# Patient Record
Sex: Female | Born: 1935 | Race: Black or African American | Hispanic: No | Smoking: Never smoker
Health system: Southern US, Community
[De-identification: ages and names within clinical notes are randomized; demographics above are authoritative.]

## PROBLEM LIST (undated history)

## (undated) DIAGNOSIS — I10 Essential (primary) hypertension: Secondary | ICD-10-CM

## (undated) DIAGNOSIS — R443 Hallucinations, unspecified: Secondary | ICD-10-CM

## (undated) DIAGNOSIS — N3281 Overactive bladder: Secondary | ICD-10-CM

## (undated) DIAGNOSIS — H409 Unspecified glaucoma: Secondary | ICD-10-CM

## (undated) DIAGNOSIS — C50919 Malignant neoplasm of unspecified site of unspecified female breast: Secondary | ICD-10-CM

## (undated) DIAGNOSIS — K5792 Diverticulitis of intestine, part unspecified, without perforation or abscess without bleeding: Secondary | ICD-10-CM

## (undated) DIAGNOSIS — F0391 Unspecified dementia with behavioral disturbance: Secondary | ICD-10-CM

## (undated) DIAGNOSIS — F03918 Unspecified dementia, unspecified severity, with other behavioral disturbance: Secondary | ICD-10-CM

## (undated) HISTORY — PX: ABDOMINAL HYSTERECTOMY: SHX81

## (undated) HISTORY — PX: BREAST BIOPSY: SHX20

## (undated) HISTORY — DX: Unspecified dementia, unspecified severity, with other behavioral disturbance: F03.918

## (undated) HISTORY — DX: Essential (primary) hypertension: I10

## (undated) HISTORY — PX: BREAST LUMPECTOMY: SHX2

## (undated) HISTORY — DX: Unspecified glaucoma: H40.9

## (undated) HISTORY — DX: Unspecified dementia with behavioral disturbance: F03.91

## (undated) HISTORY — DX: Overactive bladder: N32.81

## (undated) HISTORY — DX: Hallucinations, unspecified: R44.3

## (undated) HISTORY — PX: CATARACT EXTRACTION, BILATERAL: SHX1313

## (undated) HISTORY — DX: Diverticulitis of intestine, part unspecified, without perforation or abscess without bleeding: K57.92

---

## 2016-10-29 DIAGNOSIS — L851 Acquired keratosis [keratoderma] palmaris et plantaris: Secondary | ICD-10-CM | POA: Diagnosis not present

## 2016-10-29 DIAGNOSIS — E1151 Type 2 diabetes mellitus with diabetic peripheral angiopathy without gangrene: Secondary | ICD-10-CM | POA: Diagnosis not present

## 2016-10-29 DIAGNOSIS — B351 Tinea unguium: Secondary | ICD-10-CM | POA: Diagnosis not present

## 2016-11-25 DIAGNOSIS — I10 Essential (primary) hypertension: Secondary | ICD-10-CM | POA: Diagnosis not present

## 2016-11-25 DIAGNOSIS — G4733 Obstructive sleep apnea (adult) (pediatric): Secondary | ICD-10-CM | POA: Diagnosis not present

## 2016-11-25 DIAGNOSIS — F0390 Unspecified dementia without behavioral disturbance: Secondary | ICD-10-CM | POA: Diagnosis not present

## 2016-11-25 DIAGNOSIS — N183 Chronic kidney disease, stage 3 (moderate): Secondary | ICD-10-CM | POA: Diagnosis not present

## 2016-12-12 DIAGNOSIS — F17290 Nicotine dependence, other tobacco product, uncomplicated: Secondary | ICD-10-CM | POA: Diagnosis not present

## 2016-12-12 DIAGNOSIS — K5900 Constipation, unspecified: Secondary | ICD-10-CM | POA: Diagnosis not present

## 2016-12-12 DIAGNOSIS — N289 Disorder of kidney and ureter, unspecified: Secondary | ICD-10-CM | POA: Diagnosis not present

## 2016-12-12 DIAGNOSIS — F0390 Unspecified dementia without behavioral disturbance: Secondary | ICD-10-CM | POA: Diagnosis not present

## 2016-12-12 DIAGNOSIS — Z853 Personal history of malignant neoplasm of breast: Secondary | ICD-10-CM | POA: Diagnosis not present

## 2016-12-12 DIAGNOSIS — Z90710 Acquired absence of both cervix and uterus: Secondary | ICD-10-CM | POA: Diagnosis not present

## 2016-12-12 DIAGNOSIS — E119 Type 2 diabetes mellitus without complications: Secondary | ICD-10-CM | POA: Diagnosis not present

## 2016-12-12 DIAGNOSIS — R109 Unspecified abdominal pain: Secondary | ICD-10-CM | POA: Diagnosis not present

## 2016-12-12 DIAGNOSIS — Z7982 Long term (current) use of aspirin: Secondary | ICD-10-CM | POA: Diagnosis not present

## 2016-12-12 DIAGNOSIS — I10 Essential (primary) hypertension: Secondary | ICD-10-CM | POA: Diagnosis not present

## 2016-12-12 DIAGNOSIS — Z880 Allergy status to penicillin: Secondary | ICD-10-CM | POA: Diagnosis not present

## 2016-12-14 DIAGNOSIS — M542 Cervicalgia: Secondary | ICD-10-CM | POA: Diagnosis not present

## 2016-12-14 DIAGNOSIS — K5909 Other constipation: Secondary | ICD-10-CM | POA: Diagnosis not present

## 2016-12-24 DIAGNOSIS — E7800 Pure hypercholesterolemia, unspecified: Secondary | ICD-10-CM | POA: Diagnosis not present

## 2016-12-24 DIAGNOSIS — I10 Essential (primary) hypertension: Secondary | ICD-10-CM | POA: Diagnosis not present

## 2016-12-24 DIAGNOSIS — H401132 Primary open-angle glaucoma, bilateral, moderate stage: Secondary | ICD-10-CM | POA: Diagnosis not present

## 2016-12-24 DIAGNOSIS — E119 Type 2 diabetes mellitus without complications: Secondary | ICD-10-CM | POA: Diagnosis not present

## 2017-01-07 DIAGNOSIS — L851 Acquired keratosis [keratoderma] palmaris et plantaris: Secondary | ICD-10-CM | POA: Diagnosis not present

## 2017-01-07 DIAGNOSIS — E1151 Type 2 diabetes mellitus with diabetic peripheral angiopathy without gangrene: Secondary | ICD-10-CM | POA: Diagnosis not present

## 2017-01-07 DIAGNOSIS — B351 Tinea unguium: Secondary | ICD-10-CM | POA: Diagnosis not present

## 2017-02-10 DIAGNOSIS — E119 Type 2 diabetes mellitus without complications: Secondary | ICD-10-CM | POA: Diagnosis not present

## 2017-03-03 DIAGNOSIS — N183 Chronic kidney disease, stage 3 (moderate): Secondary | ICD-10-CM | POA: Diagnosis not present

## 2017-03-03 DIAGNOSIS — I10 Essential (primary) hypertension: Secondary | ICD-10-CM | POA: Diagnosis not present

## 2017-03-03 DIAGNOSIS — M47813 Spondylosis without myelopathy or radiculopathy, cervicothoracic region: Secondary | ICD-10-CM | POA: Diagnosis not present

## 2017-03-03 DIAGNOSIS — E119 Type 2 diabetes mellitus without complications: Secondary | ICD-10-CM | POA: Diagnosis not present

## 2017-04-25 DIAGNOSIS — D62 Acute posthemorrhagic anemia: Secondary | ICD-10-CM | POA: Diagnosis present

## 2017-04-25 DIAGNOSIS — Z90710 Acquired absence of both cervix and uterus: Secondary | ICD-10-CM | POA: Diagnosis not present

## 2017-04-25 DIAGNOSIS — K649 Unspecified hemorrhoids: Secondary | ICD-10-CM | POA: Diagnosis not present

## 2017-04-25 DIAGNOSIS — G4733 Obstructive sleep apnea (adult) (pediatric): Secondary | ICD-10-CM | POA: Diagnosis present

## 2017-04-25 DIAGNOSIS — H409 Unspecified glaucoma: Secondary | ICD-10-CM | POA: Diagnosis present

## 2017-04-25 DIAGNOSIS — Z880 Allergy status to penicillin: Secondary | ICD-10-CM | POA: Diagnosis not present

## 2017-04-25 DIAGNOSIS — K922 Gastrointestinal hemorrhage, unspecified: Secondary | ICD-10-CM | POA: Diagnosis not present

## 2017-04-25 DIAGNOSIS — K449 Diaphragmatic hernia without obstruction or gangrene: Secondary | ICD-10-CM | POA: Diagnosis present

## 2017-04-25 DIAGNOSIS — K5790 Diverticulosis of intestine, part unspecified, without perforation or abscess without bleeding: Secondary | ICD-10-CM | POA: Diagnosis not present

## 2017-04-25 DIAGNOSIS — M1990 Unspecified osteoarthritis, unspecified site: Secondary | ICD-10-CM | POA: Diagnosis present

## 2017-04-25 DIAGNOSIS — Z853 Personal history of malignant neoplasm of breast: Secondary | ICD-10-CM | POA: Diagnosis not present

## 2017-04-25 DIAGNOSIS — B9681 Helicobacter pylori [H. pylori] as the cause of diseases classified elsewhere: Secondary | ICD-10-CM | POA: Diagnosis not present

## 2017-04-25 DIAGNOSIS — K2971 Gastritis, unspecified, with bleeding: Secondary | ICD-10-CM | POA: Diagnosis not present

## 2017-04-25 DIAGNOSIS — K2951 Unspecified chronic gastritis with bleeding: Secondary | ICD-10-CM | POA: Diagnosis not present

## 2017-04-25 DIAGNOSIS — K640 First degree hemorrhoids: Secondary | ICD-10-CM | POA: Diagnosis present

## 2017-04-25 DIAGNOSIS — D649 Anemia, unspecified: Secondary | ICD-10-CM | POA: Diagnosis not present

## 2017-04-25 DIAGNOSIS — F17290 Nicotine dependence, other tobacco product, uncomplicated: Secondary | ICD-10-CM | POA: Diagnosis present

## 2017-04-25 DIAGNOSIS — Z7982 Long term (current) use of aspirin: Secondary | ICD-10-CM | POA: Diagnosis not present

## 2017-04-25 DIAGNOSIS — I10 Essential (primary) hypertension: Secondary | ICD-10-CM | POA: Diagnosis present

## 2017-04-25 DIAGNOSIS — F0390 Unspecified dementia without behavioral disturbance: Secondary | ICD-10-CM | POA: Diagnosis present

## 2017-04-25 DIAGNOSIS — K5731 Diverticulosis of large intestine without perforation or abscess with bleeding: Secondary | ICD-10-CM | POA: Diagnosis present

## 2017-04-25 DIAGNOSIS — E119 Type 2 diabetes mellitus without complications: Secondary | ICD-10-CM | POA: Diagnosis present

## 2017-04-25 DIAGNOSIS — E785 Hyperlipidemia, unspecified: Secondary | ICD-10-CM | POA: Diagnosis present

## 2017-04-25 DIAGNOSIS — K2970 Gastritis, unspecified, without bleeding: Secondary | ICD-10-CM | POA: Diagnosis present

## 2017-04-26 DIAGNOSIS — D649 Anemia, unspecified: Secondary | ICD-10-CM | POA: Diagnosis not present

## 2017-04-26 DIAGNOSIS — K922 Gastrointestinal hemorrhage, unspecified: Secondary | ICD-10-CM | POA: Diagnosis not present

## 2017-04-27 DIAGNOSIS — K2951 Unspecified chronic gastritis with bleeding: Secondary | ICD-10-CM | POA: Diagnosis not present

## 2017-04-27 DIAGNOSIS — K5790 Diverticulosis of intestine, part unspecified, without perforation or abscess without bleeding: Secondary | ICD-10-CM | POA: Diagnosis not present

## 2017-04-27 DIAGNOSIS — K2971 Gastritis, unspecified, with bleeding: Secondary | ICD-10-CM | POA: Diagnosis not present

## 2017-04-27 DIAGNOSIS — B9681 Helicobacter pylori [H. pylori] as the cause of diseases classified elsewhere: Secondary | ICD-10-CM | POA: Diagnosis not present

## 2017-04-27 DIAGNOSIS — K922 Gastrointestinal hemorrhage, unspecified: Secondary | ICD-10-CM | POA: Diagnosis not present

## 2017-04-27 DIAGNOSIS — K649 Unspecified hemorrhoids: Secondary | ICD-10-CM | POA: Diagnosis not present

## 2017-09-07 DIAGNOSIS — G3184 Mild cognitive impairment, so stated: Secondary | ICD-10-CM | POA: Diagnosis not present

## 2017-09-09 DIAGNOSIS — Z23 Encounter for immunization: Secondary | ICD-10-CM | POA: Diagnosis not present

## 2017-09-09 DIAGNOSIS — I10 Essential (primary) hypertension: Secondary | ICD-10-CM | POA: Diagnosis not present

## 2017-09-09 DIAGNOSIS — H401132 Primary open-angle glaucoma, bilateral, moderate stage: Secondary | ICD-10-CM | POA: Diagnosis not present

## 2017-09-09 DIAGNOSIS — E7800 Pure hypercholesterolemia, unspecified: Secondary | ICD-10-CM | POA: Diagnosis not present

## 2017-09-09 DIAGNOSIS — K5791 Diverticulosis of intestine, part unspecified, without perforation or abscess with bleeding: Secondary | ICD-10-CM | POA: Diagnosis not present

## 2017-09-09 DIAGNOSIS — F0390 Unspecified dementia without behavioral disturbance: Secondary | ICD-10-CM | POA: Diagnosis not present

## 2017-12-20 DIAGNOSIS — I10 Essential (primary) hypertension: Secondary | ICD-10-CM | POA: Diagnosis not present

## 2017-12-20 DIAGNOSIS — H35033 Hypertensive retinopathy, bilateral: Secondary | ICD-10-CM | POA: Diagnosis not present

## 2017-12-20 DIAGNOSIS — H401130 Primary open-angle glaucoma, bilateral, stage unspecified: Secondary | ICD-10-CM | POA: Diagnosis not present

## 2017-12-20 DIAGNOSIS — E7800 Pure hypercholesterolemia, unspecified: Secondary | ICD-10-CM | POA: Diagnosis not present

## 2018-01-13 ENCOUNTER — Ambulatory Visit: Payer: Self-pay | Admitting: Primary Care

## 2018-01-13 ENCOUNTER — Encounter (INDEPENDENT_AMBULATORY_CARE_PROVIDER_SITE_OTHER): Payer: Self-pay

## 2018-01-13 ENCOUNTER — Encounter: Payer: Self-pay | Admitting: Primary Care

## 2018-01-13 ENCOUNTER — Telehealth: Payer: Self-pay

## 2018-01-13 ENCOUNTER — Ambulatory Visit (INDEPENDENT_AMBULATORY_CARE_PROVIDER_SITE_OTHER): Payer: Medicare Other | Admitting: Primary Care

## 2018-01-13 VITALS — BP 116/60 | HR 69 | Temp 98.2°F | Ht 66.0 in | Wt 183.4 lb

## 2018-01-13 DIAGNOSIS — I10 Essential (primary) hypertension: Secondary | ICD-10-CM | POA: Insufficient documentation

## 2018-01-13 DIAGNOSIS — F0391 Unspecified dementia with behavioral disturbance: Secondary | ICD-10-CM | POA: Diagnosis not present

## 2018-01-13 DIAGNOSIS — R443 Hallucinations, unspecified: Secondary | ICD-10-CM | POA: Diagnosis not present

## 2018-01-13 DIAGNOSIS — N3281 Overactive bladder: Secondary | ICD-10-CM | POA: Insufficient documentation

## 2018-01-13 DIAGNOSIS — H409 Unspecified glaucoma: Secondary | ICD-10-CM

## 2018-01-13 DIAGNOSIS — E785 Hyperlipidemia, unspecified: Secondary | ICD-10-CM | POA: Diagnosis not present

## 2018-01-13 DIAGNOSIS — F03918 Unspecified dementia, unspecified severity, with other behavioral disturbance: Secondary | ICD-10-CM | POA: Insufficient documentation

## 2018-01-13 LAB — LIPID PANEL
Cholesterol: 135 mg/dL (ref 0–200)
HDL: 61.9 mg/dL (ref >39.00)
LDL Cholesterol: 64 mg/dL (ref 0–99)
NonHDL: 73.56
TRIGLYCERIDES: 47 mg/dL (ref 0.0–149.0)
Total CHOL/HDL Ratio: 2
VLDL: 9.4 mg/dL (ref 0.0–40.0)

## 2018-01-13 LAB — COMPREHENSIVE METABOLIC PANEL
ALBUMIN: 4 g/dL (ref 3.5–5.2)
ALT: 29 U/L (ref 0–35)
AST: 28 U/L (ref 0–37)
Alkaline Phosphatase: 81 U/L (ref 39–117)
BUN: 14 mg/dL (ref 6–23)
CALCIUM: 9.5 mg/dL (ref 8.4–10.5)
CHLORIDE: 105 meq/L (ref 96–112)
CO2: 30 mEq/L (ref 19–32)
CREATININE: 1.15 mg/dL (ref 0.40–1.20)
GFR: 58.33 mL/min — ABNORMAL LOW (ref >60.00)
Glucose, Bld: 154 mg/dL — ABNORMAL HIGH (ref 70–99)
Potassium: 4 mEq/L (ref 3.5–5.1)
Sodium: 141 mEq/L (ref 135–145)
Total Bilirubin: 0.7 mg/dL (ref 0.2–1.2)
Total Protein: 7.3 g/dL (ref 6.0–8.3)

## 2018-01-13 MED ORDER — SERTRALINE HCL 50 MG PO TABS: 50.0000 mg | ORAL_TABLET | Freq: Every day | ORAL | 0 refills | Status: DC

## 2018-01-13 NOTE — Telephone Encounter (Signed)
Left message for patient's daughter to call Anastasiya back in regards to a referral-Anastasiya V Hopkins, RMA

## 2018-01-13 NOTE — Assessment & Plan Note (Signed)
Diagnosed in 2009, managed on medications since. Suspect recent hallucinations are secondary to anxiety from stress.   Check UA today. Also check CMP.  Increase Zoloft from 25 mg to 50 mg. Her daughter will update in 4 weeks. Denies SI/HI. Neuro exam overall unremarkable.

## 2018-01-13 NOTE — Progress Notes (Signed)
Subjective:    Patient ID: Caroline French, female    DOB: [birth year], 83 y.o.   MRN: [id number]  HPI  Caroline French is an 83 year old female who presents today to establish care and discuss the problems mentioned below. Will obtain old records. Her daughter is with her today helping to provide information for her HPI.   1) Hyperlipidemia: Currently managed on atorvastatin 40 mg. Her last cholesterol check was within the last year.   2) Dementia with Behavorial Disturbance: Currently managed on donepezil 5 mg, memantine XR 28 mg, Zoloft 25 mg. Diagnosed around 2009 and has been on medication since.  She has a history of hallucinations that have been present over the past 4 years which have been intermittent. She hasn't experienced any hallucinations over the last 8 months up until just a few weeks ago. Over the last several weeks her other daughter has been diagnosed with cancer and her grandson has been going through marital problems. Her daughter today reports that she's been anxious and seeing hallucinations since.   Last week she felt someone under her mattress punching through. She has also felt as though something was in her bed (animal) for which she could feel the fur. All symptoms are present at bedtime which cause her to become upset. When she worries about a family member she wants to call them during the night to talk.   She is currently residing with her daughter for whom she's lived with for the past 8 months. She is active during the day, walks her dog three times daily, can get home by herself. Her daughter notices more forgetfulness including forgetting to feed her dog, she also notices repetitive questioning.  3) Overactive Bladder: Currently managed on Toviaz 8 mg. She feels well managed on this medication.   4) Glaucoma: Currently managed on Cosopt, Xalantan. Currently following with Dr. Valma Cava.   5) Essential Hypertension: Currently managed on lisinopril 5 mg. She does not check  her blood pressure at home. She denies dizziness, chest pain.   BP Readings from Last 3 Encounters:  01/13/18 116/60     Review of Systems  Constitutional: Negative for fatigue.  Eyes:       History of glaucoma  Respiratory: Negative for shortness of breath.   Cardiovascular: Negative for chest pain.  Genitourinary: Negative for dysuria, flank pain and frequency.  Neurological: Negative for dizziness and headaches.  Psychiatric/Behavioral: Positive for hallucinations. The patient is nervous/anxious.        See HPI        Past Medical History:  Diagnosis Date  . Dementia with behavioral disturbance   . Diverticulitis   . Essential hypertension   . Glaucoma   . Hallucinations   . Overactive bladder      Social History   Socioeconomic History  . Marital status: Widowed    Spouse name: Not on file  . Number of children: Not on file  . Years of education: Not on file  . Highest education level: Not on file  Occupational History  . Not on file  Social Needs  . Financial resource strain: Not on file  . Food insecurity:    Worry: Not on file    Inability: Not on file  . Transportation needs:    Medical: Not on file    Non-medical: Not on file  Tobacco Use  . Smoking status: Never Smoker  . Smokeless tobacco: Never Used  Substance and Sexual Activity  . Alcohol use: Never  Frequency: Never  . Drug use: Not on file  . Sexual activity: Not on file  Lifestyle  . Physical activity:    Days per week: Not on file    Minutes per session: Not on file  . Stress: Not on file  Relationships  . Social connections:    Talks on phone: Not on file    Gets together: Not on file    Attends religious service: Not on file    Active member of club or organization: Not on file    Attends meetings of clubs or organizations: Not on file    Relationship status: Not on file  . Intimate partner violence:    Fear of current or ex partner: Not on file    Emotionally abused: Not  on file    Physically abused: Not on file    Forced sexual activity: Not on file  Other Topics Concern  . Not on file  Social History Narrative   Widow.   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   Moved from [place].     No family history on file.  Allergies  Allergen Reactions  . Penicillins     Current Outpatient Medications on File Prior to Visit  Medication Sig Dispense Refill  . ASPIRIN LOW DOSE 81 MG EC tablet Take 81 mg by mouth daily.  11  . atorvastatin (LIPITOR) 40 MG tablet Take 40 mg by mouth at bedtime.  11  . CALCIUM CITRATE + D 315-200 MG-UNIT tablet Take 1 tablet by mouth daily.  11  . donepezil (ARICEPT) 5 MG tablet TAKE 1 TABLET  5 MG TOTAL  BY MOUTH EVERY EVENING  3  . dorzolamide-timolol (COSOPT) 22.3-6.8 MG/ML ophthalmic solution INSTILL 1 DROP INTO BOTH EYES TWICE A DAY AS DIRECTED  2  . fluticasone (FLONASE) 50 MCG/ACT nasal spray Place 2 sprays into both nostrils daily.  3  . latanoprost (XALATAN) 0.005 % ophthalmic solution INSTILL 1 DROP TO BOTH EYES EVERY EVENING AT BEDTIME  6  . lisinopril (PRINIVIL,ZESTRIL) 5 MG tablet Take 5 mg by mouth every morning.  11  . memantine (NAMENDA XR) 28 MG CP24 24 hr capsule Take 28 mg by mouth daily.  3  . Multiple Vitamin (MULTI-VITAMINS) TABS Take 1 tablet by mouth daily.  11  . Omega-3 Fatty Acids (SEA-OMEGA 30) 1200 MG CAPS Take 1 capsule by mouth daily.  11  . STOOL SOFTENER 100 MG capsule TAKE TWO  2  CAPSULES BY MOUTH DAILY  11  . TOVIAZ 8 MG TB24 tablet Take 8 mg by mouth every morning.  11   No current facility-administered medications on file prior to visit.     BP 116/60   Pulse 69   Temp 98.2 F (36.8 C) (Oral)   Ht 5\' 6"  (1.676 m)   Wt 183 lb 6.4 oz (83.2 kg)   SpO2 95%   BMI 29.60 kg/m    Objective:   Physical Exam  Constitutional: She is oriented to person, place, and time. She appears well-nourished.  Neck: Neck supple.  Cardiovascular: Normal rate and regular rhythm.  Pulmonary/Chest: Effort  normal and breath sounds normal.  Neurological: She is alert and oriented to person, place, and time.  Answers most questions appropriately.   Skin: Skin is warm and dry.  Psychiatric: She has a normal mood and affect.          Assessment & Plan:

## 2018-01-13 NOTE — Patient Instructions (Signed)
Stop by the lab prior to leaving today. I will notify you of your results once received.   We've increased your Zoloft to 50 mg from 25 mg. You may take two of the 25 mg tablets until your bottle is empty, only take one of the 50 mg tablets.   Please update me in 4 weeks in regards to hallucinations and anxiety.  Please schedule a follow up appointment in 6 months.   It was a pleasure to meet you today! Please don't hesitate to call or message me with any questions. Welcome to [facility]!

## 2018-01-13 NOTE — Assessment & Plan Note (Signed)
Managed on Toviaz, doing well. Continue same.

## 2018-01-13 NOTE — Assessment & Plan Note (Signed)
Lipid panel pending. Continue atorvastatin 40 mg.

## 2018-01-13 NOTE — Assessment & Plan Note (Signed)
Following with ophthalmology. 

## 2018-01-13 NOTE — Assessment & Plan Note (Signed)
Managed on lisinopril 5 mg, continue same. BP stable.

## 2018-01-15 ENCOUNTER — Other Ambulatory Visit: Payer: Self-pay | Admitting: Primary Care

## 2018-01-15 DIAGNOSIS — R739 Hyperglycemia, unspecified: Secondary | ICD-10-CM

## 2018-01-20 ENCOUNTER — Other Ambulatory Visit (INDEPENDENT_AMBULATORY_CARE_PROVIDER_SITE_OTHER): Payer: Medicare Other

## 2018-01-20 DIAGNOSIS — R739 Hyperglycemia, unspecified: Secondary | ICD-10-CM | POA: Diagnosis not present

## 2018-01-20 LAB — POCT GLYCOSYLATED HEMOGLOBIN (HGB A1C): Hemoglobin A1C: 5.9

## 2018-01-23 ENCOUNTER — Encounter: Payer: Self-pay | Admitting: *Deleted

## 2018-01-23 ENCOUNTER — Other Ambulatory Visit: Payer: Self-pay | Admitting: Primary Care

## 2018-01-23 DIAGNOSIS — F0391 Unspecified dementia with behavioral disturbance: Secondary | ICD-10-CM

## 2018-01-23 MED ORDER — SERTRALINE HCL 50 MG PO TABS: 50.0000 mg | ORAL_TABLET | Freq: Every day | ORAL | 0 refills | Status: DC

## 2018-01-23 MED ORDER — MEMANTINE HCL ER 28 MG PO CP24: 28.0000 mg | ORAL_CAPSULE | Freq: Every day | ORAL | 1 refills | Status: DC

## 2018-01-23 NOTE — Telephone Encounter (Signed)
Noted, refill sent to pharmacy. 

## 2018-01-23 NOTE — Telephone Encounter (Signed)
Copied from [place] [phone]. Topic: Quick Communication - Rx Refill/Question >> Jan 23, 2018  8:48 AM Corie Chiquito, [place] wrote: Medication: Sertraline 50 mg Mematine 28 mg Has the patient contacted their pharmacy? Yes Patient daughter is calling on behalf of her mother because she needs a refill on the above medication. Stated that they have been in contact with the pharmacy but was told to contact the doctors office for refills Preferred Pharmacy (with phone number or street name): [facility] [phone] Agent: Please be advised that RX refills may take up to 3 business days. We ask that you follow-up with your pharmacy.

## 2018-01-23 NOTE — Telephone Encounter (Signed)
Requst for refill  Memantine(Namenda XR) 25mg   24hr cap. Noted to be filled previously by historical provider. Sertraline 50mg  prescription resent to [facility] as requested by pt's daughter.   LOV: 01/13/18  [place] [phone]

## 2018-01-31 ENCOUNTER — Other Ambulatory Visit: Payer: Self-pay | Admitting: Primary Care

## 2018-01-31 DIAGNOSIS — J302 Other seasonal allergic rhinitis: Secondary | ICD-10-CM

## 2018-01-31 NOTE — Telephone Encounter (Signed)
Copied from [place] [phone]. Topic: Quick Communication - Rx Refill/Question >> Jan 31, 2018 10:48 AM Oliver Pila B wrote: Medication: fluticasone (FLONASE) 50 MCG/ACT nasal spray [[id number]]  Has the patient contacted their pharmacy? Yes.   (Agent: If no, request that the patient contact the pharmacy for the refill.) Preferred Pharmacy (with phone number or street name): [facility] Agent: Please be advised that RX refills may take up to 3 business days. We ask that you follow-up with your pharmacy.

## 2018-02-01 MED ORDER — FLUTICASONE PROPIONATE 50 MCG/ACT NA SUSP: 1.0000 | Freq: Two times a day (BID) | NASAL | 3 refills | Status: DC

## 2018-02-01 NOTE — Telephone Encounter (Signed)
Rx request for historical medication: Flonase  LOV: 01/13/18  PCP: [place]: verified

## 2018-02-01 NOTE — Telephone Encounter (Signed)
Pt established care on 01/13/18.Please advise.

## 2018-02-06 DIAGNOSIS — F0390 Unspecified dementia without behavioral disturbance: Secondary | ICD-10-CM | POA: Diagnosis not present

## 2018-05-24 ENCOUNTER — Telehealth: Payer: Self-pay | Admitting: Primary Care

## 2018-05-24 ENCOUNTER — Ambulatory Visit: Payer: PRIVATE HEALTH INSURANCE | Admitting: Primary Care

## 2018-05-24 ENCOUNTER — Ambulatory Visit: Payer: Medicare Other | Admitting: Primary Care

## 2018-05-24 DIAGNOSIS — Z0289 Encounter for other administrative examinations: Secondary | ICD-10-CM

## 2018-05-24 NOTE — Telephone Encounter (Signed)
Pt's caregiver dropped off FL2 form to be filled out. States she isn't getting the TB test. Form given to [place].

## 2018-05-24 NOTE — Telephone Encounter (Signed)
Spoken to patient's daughter. She stated that patient is going to be placed [facility]  [address] [place], [place] [address]. This is a resting home.  Daughter stated that she need this form done as soon as possible because she took a plane to get this done. I did offer to fax this to the faculty and for her to provide this information. She stated that she will call.

## 2018-05-24 NOTE — Telephone Encounter (Signed)
Completed and placed in Chans inbox. 

## 2018-05-24 NOTE — Telephone Encounter (Signed)
[name], the caregiver calling with a fax number for the [facility] forms to be faxed. That number is [phone]. She would like the form faxed and she will also pick a copy of the form up.

## 2018-05-25 ENCOUNTER — Ambulatory Visit: Payer: PRIVATE HEALTH INSURANCE | Admitting: Primary Care

## 2018-05-25 NOTE — Telephone Encounter (Signed)
Form has been faxed. Daughter also has been notified copy left in the front office for pick up at well.

## 2018-05-29 ENCOUNTER — Other Ambulatory Visit: Payer: Self-pay | Admitting: Primary Care

## 2018-05-29 DIAGNOSIS — F0391 Unspecified dementia with behavioral disturbance: Secondary | ICD-10-CM

## 2018-06-02 DIAGNOSIS — Z111 Encounter for screening for respiratory tuberculosis: Secondary | ICD-10-CM | POA: Diagnosis not present

## 2018-06-06 ENCOUNTER — Ambulatory Visit: Payer: PRIVATE HEALTH INSURANCE | Admitting: Primary Care

## 2018-06-06 ENCOUNTER — Telehealth: Payer: Self-pay | Admitting: *Deleted

## 2018-06-06 NOTE — Telephone Encounter (Addendum)
Paperwork from [place] has been faxed.  Caroline French had completed Physician/Diet form for [facility] including these information.

## 2018-06-06 NOTE — Telephone Encounter (Signed)
Copied from [place]. Topic: General - Other >> Jun 06, 2018  3:39 PM Bea Graff, [place] wrote: Reason for CRM: Patients daughter is wanting to see if the [facility] form be updated. She states that 3 medications; fiber gummies, systane eye drops, and miralax are missing from the form. She states she would like to see if this can be corrected and fax backed to the nursing home. [facility] in [place]. Fax#: [phone]

## 2018-06-06 NOTE — Telephone Encounter (Signed)
Okay to add to [facility] form. Do we have a copy?

## 2018-07-17 ENCOUNTER — Ambulatory Visit: Payer: PRIVATE HEALTH INSURANCE | Admitting: Primary Care

## 2018-07-18 ENCOUNTER — Ambulatory Visit: Payer: PRIVATE HEALTH INSURANCE | Admitting: Primary Care

## 2018-07-18 DIAGNOSIS — Z0289 Encounter for other administrative examinations: Secondary | ICD-10-CM

## 2018-07-21 ENCOUNTER — Ambulatory Visit (INDEPENDENT_AMBULATORY_CARE_PROVIDER_SITE_OTHER): Payer: Medicare Other | Admitting: Primary Care

## 2018-07-21 ENCOUNTER — Encounter: Payer: Self-pay | Admitting: Primary Care

## 2018-07-21 VITALS — BP 138/86 | HR 62 | Temp 97.9°F | Ht 66.0 in | Wt 190.8 lb

## 2018-07-21 DIAGNOSIS — Z23 Encounter for immunization: Secondary | ICD-10-CM

## 2018-07-21 DIAGNOSIS — H409 Unspecified glaucoma: Secondary | ICD-10-CM

## 2018-07-21 DIAGNOSIS — F0391 Unspecified dementia with behavioral disturbance: Secondary | ICD-10-CM | POA: Diagnosis not present

## 2018-07-21 DIAGNOSIS — N3281 Overactive bladder: Secondary | ICD-10-CM | POA: Diagnosis not present

## 2018-07-21 DIAGNOSIS — E785 Hyperlipidemia, unspecified: Secondary | ICD-10-CM

## 2018-07-21 DIAGNOSIS — R7303 Prediabetes: Secondary | ICD-10-CM

## 2018-07-21 DIAGNOSIS — I10 Essential (primary) hypertension: Secondary | ICD-10-CM | POA: Diagnosis not present

## 2018-07-21 LAB — POCT GLYCOSYLATED HEMOGLOBIN (HGB A1C): Hemoglobin A1C: 5.8 % — AB (ref 4.0–5.6)

## 2018-07-21 MED ORDER — MEMANTINE HCL ER 28 MG PO CP24: ORAL_CAPSULE | ORAL | 3 refills | Status: DC

## 2018-07-21 MED ORDER — DONEPEZIL HCL 5 MG PO TABS: ORAL_TABLET | ORAL | 3 refills | Status: DC

## 2018-07-21 NOTE — Assessment & Plan Note (Addendum)
Overall doing better on Zoloft 50 mg, continue same.  Continue donepezil and memantine.

## 2018-07-21 NOTE — Assessment & Plan Note (Signed)
Following with ophthalmology, continue current regimen. 

## 2018-07-21 NOTE — Assessment & Plan Note (Signed)
Noted on prior labs. Repeat A1C today about the same. Continue to monitor.

## 2018-07-21 NOTE — Progress Notes (Signed)
Subjective:    Patient ID: Caroline French, female    DOB: [birth year], 83 y.o.   MRN: [id number]  HPI  Caroline French is an 83 year old female who presents today for follow up. She is here with her daughter who provides most of the HPI.  1) Essential Hypertension: Currently managed on lisinopril 5 mg. She denies chest pain, dizziness, headaches.  BP Readings from Last 3 Encounters:  07/21/18 138/86  01/13/18 116/60   2) Dementia with Behavorial Disturbance: Currently managed on donepezil 5 mg, memantine XR 28 mg, sertraline 50 mg. She is now back home from assisted living. She is living with her daughter. She is involved in adult daycare once weekly and will be going 2-3 times weekly soon. She's doing well on Zoloft 50 mg, her daughter denies that she's reporting hallucinations.   3) Glaucoma: Currently managed on Xalatan, Cosopt. She continues to follow with ophthalmology.   4) Overactive Bladder: Currently managed on toviaz 8 mg. She is wearing depends and pads and is doing well.   Review of Systems  Respiratory: Negative for shortness of breath.   Cardiovascular: Negative for chest pain.  Genitourinary:       Intermittent urinary incontinence.   Neurological: Negative for dizziness and headaches.  Psychiatric/Behavioral: Negative for hallucinations and sleep disturbance. The patient is not nervous/anxious.        Feels well managed on Zoloft.       Past Medical History:  Diagnosis Date  . Dementia with behavioral disturbance ([place])   . Diverticulitis   . Essential hypertension   . Glaucoma   . Hallucinations   . Overactive bladder      Social History   Socioeconomic History  . Marital status: Widowed    Spouse name: Not on file  . Number of children: Not on file  . Years of education: Not on file  . Highest education level: Not on file  Occupational History  . Not on file  Social Needs  . Financial resource strain: Not on file  . Food insecurity:    Worry: Not on file      Inability: Not on file  . Transportation needs:    Medical: Not on file    Non-medical: Not on file  Tobacco Use  . Smoking status: Never Smoker  . Smokeless tobacco: Never Used  Substance and Sexual Activity  . Alcohol use: Never    Frequency: Never  . Drug use: Not on file  . Sexual activity: Not on file  Lifestyle  . Physical activity:    Days per week: Not on file    Minutes per session: Not on file  . Stress: Not on file  Relationships  . Social connections:    Talks on phone: Not on file    Gets together: Not on file    Attends religious service: Not on file    Active member of club or organization: Not on file    Attends meetings of clubs or organizations: Not on file    Relationship status: Not on file  . Intimate partner violence:    Fear of current or ex partner: Not on file    Emotionally abused: Not on file    Physically abused: Not on file    Forced sexual activity: Not on file  Other Topics Concern  . Not on file  Social History Narrative   Widow.   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   Moved from [place].  No family history on file.  Allergies  Allergen Reactions  . Penicillins Rash    Other reaction(s): Unknown    Current Outpatient Medications on File Prior to Visit  Medication Sig Dispense Refill  . ASPIRIN LOW DOSE 81 MG EC tablet Take 81 mg by mouth daily.  11  . atorvastatin (LIPITOR) 40 MG tablet Take 40 mg by mouth at bedtime.  11  . CALCIUM CITRATE + D 315-200 MG-UNIT tablet Take 1 tablet by mouth daily.  11  . dorzolamide-timolol (COSOPT) 22.3-6.8 MG/ML ophthalmic solution INSTILL 1 DROP INTO BOTH EYES TWICE A DAY AS DIRECTED  2  . fluticasone (FLONASE) 50 MCG/ACT nasal spray Place 1 spray into both nostrils 2 (two) times daily. 16 g 3  . latanoprost (XALATAN) 0.005 % ophthalmic solution INSTILL 1 DROP TO BOTH EYES EVERY EVENING AT BEDTIME  6  . lisinopril (PRINIVIL,ZESTRIL) 5 MG tablet Take 5 mg by mouth every morning.  11  . Multiple  Vitamin (MULTI-VITAMINS) TABS Take 1 tablet by mouth daily.  11  . Omega-3 Fatty Acids (SEA-OMEGA 30) 1200 MG CAPS Take 1 capsule by mouth daily.  11  . sertraline (ZOLOFT) 50 MG tablet TAKE 1 TABLET BY MOUTH EVERY DAY 90 tablet 0  . STOOL SOFTENER 100 MG capsule TAKE TWO  2  CAPSULES BY MOUTH DAILY  11  . TOVIAZ 8 MG TB24 tablet Take 8 mg by mouth every morning.  11   No current facility-administered medications on file prior to visit.     BP 138/86   Pulse 62   Temp 97.9 F (36.6 C) (Oral)   Ht 5\' 6"  (1.676 m)   Wt 190 lb 12 oz (86.5 kg)   SpO2 96%   BMI 30.79 kg/m    Objective:   Physical Exam  Constitutional: She appears well-nourished.  Neck: Neck supple.  Cardiovascular: Normal rate and regular rhythm.  Respiratory: Effort normal and breath sounds normal.  Neurological: She is alert.  Oriented to person and time. Follows commands.  Skin: Skin is warm and dry.  Psychiatric: She has a normal mood and affect.           Assessment & Plan:

## 2018-07-21 NOTE — Patient Instructions (Addendum)
Please call the [facility] to schedule your mammogram.  I sent refills to the pharmacy.   Stop by the lab prior to leaving today. I will notify you of your results once received.   Please schedule a physical with me and a wellness visit with our nurse in 6 months.   It was a pleasure to see you today!

## 2018-07-21 NOTE — Assessment & Plan Note (Signed)
Lipid panel from April 2019 at goal. Continue atorvastatin.

## 2018-07-21 NOTE — Assessment & Plan Note (Signed)
Doing well on Toviaz, continue same.  

## 2018-07-21 NOTE — Assessment & Plan Note (Signed)
Stable in the office today, continue lisinopril. BMP is UTD.

## 2018-07-26 ENCOUNTER — Encounter: Payer: Self-pay | Admitting: *Deleted

## 2018-08-24 ENCOUNTER — Other Ambulatory Visit: Payer: Self-pay | Admitting: Primary Care

## 2018-08-24 DIAGNOSIS — F0391 Unspecified dementia with behavioral disturbance: Secondary | ICD-10-CM

## 2018-08-29 ENCOUNTER — Other Ambulatory Visit: Payer: Self-pay | Admitting: Primary Care

## 2018-08-29 DIAGNOSIS — Z1231 Encounter for screening mammogram for malignant neoplasm of breast: Secondary | ICD-10-CM

## 2018-08-30 DIAGNOSIS — R413 Other amnesia: Secondary | ICD-10-CM | POA: Diagnosis not present

## 2018-09-29 ENCOUNTER — Other Ambulatory Visit: Payer: Self-pay

## 2018-09-29 DIAGNOSIS — H409 Unspecified glaucoma: Secondary | ICD-10-CM | POA: Diagnosis not present

## 2018-09-29 DIAGNOSIS — H353111 Nonexudative age-related macular degeneration, right eye, early dry stage: Secondary | ICD-10-CM | POA: Diagnosis not present

## 2018-09-29 MED ORDER — ATORVASTATIN CALCIUM 40 MG PO TABS: 40.0000 mg | ORAL_TABLET | Freq: Every day | ORAL | 4 refills | Status: DC

## 2018-09-29 NOTE — Telephone Encounter (Signed)
Pt calls and said [facility] had requested refill on atorvastatin 3 - 4 times with no response. I explained to pt that I do not see refill request for atorvastatin in pts chart. Refilled atorvastatin 40 mg refilled # 30 x 4. Pt seen 01/2018 and 07/2018. Pt has CPX scheduled 01/30/19. Pt voiced understanding.

## 2018-10-03 ENCOUNTER — Other Ambulatory Visit: Payer: Self-pay | Admitting: *Deleted

## 2018-10-03 DIAGNOSIS — J302 Other seasonal allergic rhinitis: Secondary | ICD-10-CM

## 2018-10-03 DIAGNOSIS — I10 Essential (primary) hypertension: Secondary | ICD-10-CM

## 2018-10-03 MED ORDER — FLUTICASONE PROPIONATE 50 MCG/ACT NA SUSP: 1.0000 | Freq: Two times a day (BID) | NASAL | 3 refills | Status: DC

## 2018-10-03 MED ORDER — ASPIRIN LOW DOSE 81 MG PO TBEC: 81.0000 mg | DELAYED_RELEASE_TABLET | Freq: Every day | ORAL | 3 refills | Status: DC

## 2018-10-03 NOTE — Telephone Encounter (Signed)
Spoke to pts daughter, who states pt is needing medication refill sent to mail order pharmacy. Last ov 07/2018

## 2018-10-03 NOTE — Telephone Encounter (Signed)
Noted, refill sent to pharmacy. 

## 2018-10-06 ENCOUNTER — Ambulatory Visit
Admission: RE | Admit: 2018-10-06 | Discharge: 2018-10-06 | Disposition: A | Discharge status: Home / Self Care | Payer: Medicare Other | Source: Ambulatory Visit | Attending: Primary Care | Admitting: Primary Care

## 2018-10-06 DIAGNOSIS — Z1231 Encounter for screening mammogram for malignant neoplasm of breast: Secondary | ICD-10-CM | POA: Diagnosis not present

## 2018-10-13 ENCOUNTER — Ambulatory Visit: Payer: Medicare Other | Admitting: Primary Care

## 2018-10-19 ENCOUNTER — Encounter: Payer: Self-pay | Admitting: Primary Care

## 2018-10-19 ENCOUNTER — Ambulatory Visit (INDEPENDENT_AMBULATORY_CARE_PROVIDER_SITE_OTHER): Payer: Medicare Other | Admitting: Primary Care

## 2018-10-19 VITALS — BP 122/74 | HR 63 | Temp 97.8°F | Ht 68.0 in | Wt 187.0 lb

## 2018-10-19 DIAGNOSIS — G4730 Sleep apnea, unspecified: Secondary | ICD-10-CM

## 2018-10-19 DIAGNOSIS — N3281 Overactive bladder: Secondary | ICD-10-CM | POA: Diagnosis not present

## 2018-10-19 MED ORDER — OXYBUTYNIN CHLORIDE ER 5 MG PO TB24: 5.0000 mg | ORAL_TABLET | Freq: Every day | ORAL | 0 refills | Status: DC

## 2018-10-19 NOTE — Patient Instructions (Addendum)
Stop Toviaz ER 8 mg for overactive bladder.  Start oxybutynin ER 5 mg every night at bedtime for urinary incontinence.  You will be contacted regarding your referral to pulmonology for the sleep apnea machine.  Please let us know if you have not been contacted within one week.   Please call me with the name where Caroline French had her last sleep study so we can get records.  It was a pleasure to see you today!

## 2018-10-19 NOTE — Assessment & Plan Note (Signed)
Diagnosed years ago, needing new CPAP machine and easier to use mask. Will get records of prior sleep study. Referral placed to pulmonology for further evaluation and management.

## 2018-10-19 NOTE — Progress Notes (Signed)
Subjective:    Patient ID: Caroline French, female    DOB: 1935-01-30, 84 y.o.   MRN: [id number]  HPI  Caroline French is an 84 year old female with a history of dementia, prediabetes, hypertension, sleep apnea who presents today to discuss sleep apnea and urinary incontinence.   She was originally diagnosed years ago and is currently using a CPAP. Her last sleep study was in 2016 or 2017. She's having problems with her CPAP mask, she's not remembering to adjust her mask strap and the air from the machine will come through. Her daughter is having to adjust her strap once every other month. Her CPAP machine is over 37 years old and her daughter is requesting a newer machine and perhaps an easier mask apparatus.   She'd also like to discuss urinary incontinence. She's been managed on Toviaz for years and her family doesn't believe that her medication is effective. They were also told by a nurse that [name] could cause delirium. She has to get up 2-3 times nightly during the night to urinate, leaks in her pad often during the day, and loses control of her urine on occasion. Given her history of dementia it's hard for her family to discern if she's urinating but she will typically lean to her side when she's urinating.   Review of Systems  Respiratory: Negative for shortness of breath.   Cardiovascular: Negative for chest pain.  Genitourinary: Negative for dysuria, frequency and hematuria.       Urinary incontinence   Psychiatric/Behavioral: Negative for sleep disturbance.       Past Medical History:  Diagnosis Date  . Dementia with behavioral disturbance ([place])   . Diverticulitis   . Essential hypertension   . Glaucoma   . Hallucinations   . Overactive bladder      Social History   Socioeconomic History  . Marital status: Widowed    Spouse name: Not on file  . Number of children: Not on file  . Years of education: Not on file  . Highest education level: Not on file  Occupational History  .  Not on file  Social Needs  . Financial resource strain: Not on file  . Food insecurity:    Worry: Not on file    Inability: Not on file  . Transportation needs:    Medical: Not on file    Non-medical: Not on file  Tobacco Use  . Smoking status: Never Smoker  . Smokeless tobacco: Never Used  Substance and Sexual Activity  . Alcohol use: Never    Frequency: Never  . Drug use: Not on file  . Sexual activity: Not on file  Lifestyle  . Physical activity:    Days per week: Not on file    Minutes per session: Not on file  . Stress: Not on file  Relationships  . Social connections:    Talks on phone: Not on file    Gets together: Not on file    Attends religious service: Not on file    Active member of club or organization: Not on file    Attends meetings of clubs or organizations: Not on file    Relationship status: Not on file  . Intimate partner violence:    Fear of current or ex partner: Not on file    Emotionally abused: Not on file    Physically abused: Not on file    Forced sexual activity: Not on file  Other Topics Concern  . Not on  file  Social History Narrative   Widow.   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   Moved from [place].    Past Surgical History:  Procedure Laterality Date  . ABDOMINAL HYSTERECTOMY    . BREAST BIOPSY    . BREAST LUMPECTOMY Left 1990's    No family history on file.  Allergies  Allergen Reactions  . Penicillins Rash    Other reaction(s): Unknown    Current Outpatient Medications on File Prior to Visit  Medication Sig Dispense Refill  . ASPIRIN LOW DOSE 81 MG EC tablet Take 1 tablet (81 mg total) by mouth daily. 90 tablet 3  . atorvastatin (LIPITOR) 40 MG tablet Take 1 tablet (40 mg total) by mouth at bedtime. 30 tablet 4  . CALCIUM CITRATE + D 315-200 MG-UNIT tablet Take 1 tablet by mouth daily.  11  . donepezil (ARICEPT) 5 MG tablet Take 1 tablet by mouth every evening for memory. 90 tablet 3  . dorzolamide-timolol (COSOPT) 22.3-6.8  MG/ML ophthalmic solution INSTILL 1 DROP INTO BOTH EYES TWICE A DAY AS DIRECTED  2  . fluticasone (FLONASE) 50 MCG/ACT nasal spray Place 1 spray into both nostrils 2 (two) times daily. As needed for allergies. 16 g 3  . latanoprost (XALATAN) 0.005 % ophthalmic solution INSTILL 1 DROP TO BOTH EYES EVERY EVENING AT BEDTIME  6  . lisinopril (PRINIVIL,ZESTRIL) 5 MG tablet Take 5 mg by mouth every morning.  11  . memantine (NAMENDA XR) 28 MG CP24 24 hr capsule Take 1 capsule by mouth once daily for memory. 90 capsule 3  . Multiple Vitamin (MULTI-VITAMINS) TABS Take 1 tablet by mouth daily.  11  . Omega-3 Fatty Acids (SEA-OMEGA 30) 1200 MG CAPS Take 1 capsule by mouth daily.  11  . sertraline (ZOLOFT) 50 MG tablet TAKE 1 TABLET BY MOUTH EVERY DAY 90 tablet 1  . STOOL SOFTENER 100 MG capsule TAKE TWO  2  CAPSULES BY MOUTH DAILY  11   No current facility-administered medications on file prior to visit.     BP 122/74 (BP Location: Left Arm, Patient Position: Sitting, Cuff Size: Large)   Pulse 63   Temp 97.8 F (36.6 C) (Oral)   Ht 5\' 8"  (1.727 m)   Wt 187 lb (84.8 kg)   SpO2 99%   BMI 28.43 kg/m    Objective:   Physical Exam  Constitutional: She appears well-nourished.  Neck: Neck supple.  Cardiovascular: Normal rate.  Respiratory: Effort normal.  Neurological:  Answers most questions appropriately   Skin: Skin is warm and dry.           Assessment & Plan:

## 2018-10-19 NOTE — Assessment & Plan Note (Signed)
Toviaz ineffective, has not tried any other medications. Discussed potential side effects of any of the medications within this group. We will stop Toviaz, start oxybutynin ER 5 mg HS. Her daughter will update.

## 2018-10-24 ENCOUNTER — Telehealth: Payer: Self-pay | Admitting: Primary Care

## 2018-10-24 NOTE — Telephone Encounter (Signed)
Noted.  Placed in chains in box for faxing to request sleep study record.

## 2018-10-24 NOTE — Telephone Encounter (Signed)
[name] pt's daughter is calling and stated the sleep study information is [place] [phone]

## 2018-10-30 NOTE — Telephone Encounter (Signed)
Left a voicemail for [employer] to give me a call regarding fax number so can fax medical release form.

## 2018-10-31 ENCOUNTER — Ambulatory Visit (INDEPENDENT_AMBULATORY_CARE_PROVIDER_SITE_OTHER): Payer: Medicare Other | Admitting: Internal Medicine

## 2018-10-31 ENCOUNTER — Encounter: Payer: Self-pay | Admitting: Internal Medicine

## 2018-10-31 VITALS — BP 112/72 | HR 68 | Resp 16 | Ht 68.0 in | Wt 189.0 lb

## 2018-10-31 DIAGNOSIS — G4719 Other hypersomnia: Secondary | ICD-10-CM

## 2018-10-31 NOTE — Telephone Encounter (Signed)
Pablo Lawrence from [facility] need you to return her call at [phone] per Hilda Blades

## 2018-10-31 NOTE — Telephone Encounter (Signed)
Message left for Butch Penny to return my call.

## 2018-10-31 NOTE — Progress Notes (Signed)
Name: Caroline French MRN: [id number] DOB: 11-Jun-1935     CONSULTATION DATE: 10/31/18 REFERRING MD : Carlis Abbott  CHIEF COMPLAINT: excessive daytime sleepiness   HISTORY OF PRESENT ILLNESS:   seen today for problems with sleep Patient  has been having sleep problems for many years Dx with OSA 10 years ago Patient has been having excessive daytime sleepiness Patient has been having extreme fatigue and tiredness, lack of energy  Patient has dementia but has been using CPAP daily as pre daughter Patient has hard time with nasal pillows and mask She needs new equipment  Discussed sleep data and reviewed with patient.  Encouraged proper weight management.  Discussed driving precautions and its relationship with hypersomnolence.  Discussed operating dangerous equipment and its relationship with hypersomnolence.  Discussed sleep hygiene, and benefits of a fixed sleep waked time.  The importance of getting eight or more hours of sleep discussed with patient.  Discussed limiting the use of the computer and television before bedtime.  Decrease naps during the day, so night time sleep will become enhanced.  Limit caffeine, and sleep deprivation.  HTN, stroke, and heart failure are potential risk factors.     PAST MEDICAL HISTORY :   has a past medical history of Dementia with behavioral disturbance ([place]), Diverticulitis, Essential hypertension, Glaucoma, Hallucinations, and Overactive bladder.  has a past surgical history that includes Breast biopsy; Abdominal hysterectomy; and Breast lumpectomy (Left, 1990's). Prior to Admission medications   Medication Sig Start Date End Date Taking? Authorizing Provider  ASPIRIN LOW DOSE 81 MG EC tablet Take 1 tablet (81 mg total) by mouth daily. 10/03/18   Pleas Koch, NP  atorvastatin (LIPITOR) 40 MG tablet Take 1 tablet (40 mg total) by mouth at bedtime. 09/29/18   Pleas Koch, NP  CALCIUM CITRATE + D 315-200 MG-UNIT tablet Take 1 tablet by  mouth daily. 01/11/18   [provider]  donepezil (ARICEPT) 5 MG tablet Take 1 tablet by mouth every evening for memory. 07/21/18   Pleas Koch, NP  dorzolamide-timolol (COSOPT) 22.3-6.8 MG/ML ophthalmic solution INSTILL 1 DROP INTO BOTH EYES TWICE A DAY AS DIRECTED 12/20/17   [provider]  fluticasone (FLONASE) 50 MCG/ACT nasal spray Place 1 spray into both nostrils 2 (two) times daily. As needed for allergies. 10/03/18   Pleas Koch, NP  latanoprost (XALATAN) 0.005 % ophthalmic solution INSTILL 1 DROP TO BOTH EYES EVERY EVENING AT BEDTIME 11/10/17   [provider]  lisinopril (PRINIVIL,ZESTRIL) 5 MG tablet Take 5 mg by mouth every morning. 01/11/18   [provider]  memantine (NAMENDA XR) 28 MG CP24 24 hr capsule Take 1 capsule by mouth once daily for memory. 07/21/18   Pleas Koch, NP  Multiple Vitamin (MULTI-VITAMINS) TABS Take 1 tablet by mouth daily. 01/11/18   [provider]  Omega-3 Fatty Acids (SEA-OMEGA 30) 1200 MG CAPS Take 1 capsule by mouth daily. 01/11/18   [provider]  oxybutynin (DITROPAN-XL) 5 MG 24 hr tablet Take 1 tablet (5 mg total) by mouth at bedtime. For urinary incontinence. 10/19/18   Pleas Koch, NP  sertraline (ZOLOFT) 50 MG tablet TAKE 1 TABLET BY MOUTH EVERY DAY 08/24/18   Pleas Koch, NP  STOOL SOFTENER 100 MG capsule TAKE TWO  2  CAPSULES BY MOUTH DAILY 01/11/18   [provider]   Allergies  Allergen Reactions  . Penicillins Rash    Other reaction(s): Unknown    FAMILY HISTORY:  +HTN SOCIAL  HISTORY:  reports that she has never smoked. She has never used smokeless tobacco. She reports that she does not drink alcohol.  REVIEW OF SYSTEMS:   Constitutional: Negative for fever, chills, weight loss, malaise/fatigue and diaphoresis.  HENT: Negative for hearing loss, ear pain, nosebleeds, congestion, sore throat, neck pain, tinnitus and ear discharge.   Eyes: Negative for  blurred vision, double vision, photophobia, pain, discharge and redness.  Respiratory: Negative for cough, hemoptysis, sputum production, shortness of breath, wheezing and stridor.   Cardiovascular: Negative for chest pain, palpitations, orthopnea, claudication, leg swelling and PND.  Gastrointestinal: Negative for heartburn, nausea, vomiting, abdominal pain, diarrhea, constipation, blood in stool and melena.  Genitourinary: Negative for dysuria, urgency, frequency, hematuria and flank pain.  Musculoskeletal: Negative for myalgias, back pain, joint pain and falls.  Skin: Negative for itching and rash.  Neurological: Negative for dizziness, tingling, tremors, sensory change, speech change, focal weakness, seizures, loss of consciousness, weakness and headaches.  Endo/Heme/Allergies: Negative for environmental allergies and polydipsia. Does not bruise/bleed easily.  ALL OTHER ROS ARE NEGATIVE   Resp 16   Ht 5\' 8"  (1.727 m)   Wt 189 lb (85.7 kg)   BMI 28.74 kg/m   112/70 HR 68    Physical Examination:   GENERAL:NAD, no fevers, chills, no weakness no fatigue HEAD: Normocephalic, atraumatic.  EYES: Pupils equal, round, reactive to light. Extraocular muscles intact. No scleral icterus.  MOUTH: Moist mucosal membrane.   EAR, NOSE, THROAT: Clear without exudates. No external lesions.  NECK: Supple. No thyromegaly. No nodules. No JVD.  PULMONARY:CTA B/L no wheezes, no crackles, no rhonchi CARDIOVASCULAR: S1 and S2. Regular rate and rhythm. No murmurs, rubs, or gallops. No edema.  GASTROINTESTINAL: Soft, nontender, nondistended. No masses. Positive bowel sounds.  MUSCULOSKELETAL: No swelling, clubbing, or edema. Range of motion full in all extremities.  NEUROLOGIC: Cranial nerves II through XII are intact. No gross focal neurological deficits.  SKIN: No ulceration, lesions, rashes, or cyanosis. Skin warm and dry. Turgor intact.  PSYCHIATRIC: Mood, affect within normal limits. The patient is  awake, alert and oriented x 3. Insight, judgment intact.      ASSESSMENT / PLAN: 84 yo with dx of OSA with ongoing issues with nasal mask   Patient needs Mask fitting services needed in [place] records for sleep apnea Obtain sleep study if insurance company needs it     Patient/Family are satisfied with Plan of action and management. All questions answered  Corrin Parker, M.D.  [facility]  Medical Director [place] Director [facility] Cardio-Pulmonary Department

## 2018-11-01 NOTE — Telephone Encounter (Signed)
Spoken to Butch Penny and she stated that she just mailed out a copy of the sleep study to the patient.

## 2018-11-03 ENCOUNTER — Ambulatory Visit: Payer: Medicare Other | Attending: Internal Medicine

## 2018-11-03 DIAGNOSIS — G4733 Obstructive sleep apnea (adult) (pediatric): Secondary | ICD-10-CM | POA: Diagnosis not present

## 2018-11-06 DIAGNOSIS — G4733 Obstructive sleep apnea (adult) (pediatric): Secondary | ICD-10-CM

## 2018-11-09 ENCOUNTER — Telehealth: Payer: Self-pay

## 2018-11-09 DIAGNOSIS — G4733 Obstructive sleep apnea (adult) (pediatric): Secondary | ICD-10-CM

## 2018-11-09 NOTE — Telephone Encounter (Signed)
LM on VM for patient to call back for sleep study results.   OSA with AHI of 28.  Recommend initiation of auto-CPAP with pressure range of 5-20 cm H2O. If repeat sleep study is needed would recommend in-lab study with pre sedation or home sleep testing.

## 2018-11-10 NOTE — Telephone Encounter (Signed)
pts daughter [name] aware of results. Orders placed Nothing further needed.

## 2018-12-03 ENCOUNTER — Emergency Department (HOSPITAL_COMMUNITY)
Admission: EM | Admit: 2018-12-03 | Discharge: 2018-12-03 | Disposition: A | Discharge status: Home / Self Care | Payer: Medicare Other | Attending: Emergency Medicine | Admitting: Emergency Medicine

## 2018-12-03 ENCOUNTER — Other Ambulatory Visit: Payer: Self-pay

## 2018-12-03 ENCOUNTER — Emergency Department (HOSPITAL_COMMUNITY): Payer: Medicare Other

## 2018-12-03 ENCOUNTER — Encounter (HOSPITAL_COMMUNITY): Payer: Self-pay

## 2018-12-03 DIAGNOSIS — R4182 Altered mental status, unspecified: Secondary | ICD-10-CM | POA: Insufficient documentation

## 2018-12-03 DIAGNOSIS — Z79899 Other long term (current) drug therapy: Secondary | ICD-10-CM | POA: Insufficient documentation

## 2018-12-03 DIAGNOSIS — R55 Syncope and collapse: Secondary | ICD-10-CM | POA: Diagnosis not present

## 2018-12-03 DIAGNOSIS — Z7982 Long term (current) use of aspirin: Secondary | ICD-10-CM | POA: Diagnosis not present

## 2018-12-03 DIAGNOSIS — F0391 Unspecified dementia with behavioral disturbance: Secondary | ICD-10-CM | POA: Insufficient documentation

## 2018-12-03 DIAGNOSIS — R4020 Unspecified coma: Secondary | ICD-10-CM | POA: Diagnosis not present

## 2018-12-03 DIAGNOSIS — I10 Essential (primary) hypertension: Secondary | ICD-10-CM | POA: Diagnosis not present

## 2018-12-03 DIAGNOSIS — I959 Hypotension, unspecified: Secondary | ICD-10-CM | POA: Diagnosis not present

## 2018-12-03 DIAGNOSIS — R9431 Abnormal electrocardiogram [ECG] [EKG]: Secondary | ICD-10-CM | POA: Diagnosis not present

## 2018-12-03 DIAGNOSIS — R531 Weakness: Secondary | ICD-10-CM | POA: Diagnosis not present

## 2018-12-03 DIAGNOSIS — R0902 Hypoxemia: Secondary | ICD-10-CM | POA: Diagnosis not present

## 2018-12-03 LAB — URINALYSIS, ROUTINE W REFLEX MICROSCOPIC
Bilirubin Urine: NEGATIVE
Glucose, UA: NEGATIVE mg/dL
Hgb urine dipstick: NEGATIVE
Ketones, ur: NEGATIVE mg/dL
Leukocytes,Ua: NEGATIVE
Nitrite: NEGATIVE
Protein, ur: NEGATIVE mg/dL
Specific Gravity, Urine: 1.025 (ref 1.005–1.030)
pH: 5 (ref 5.0–8.0)

## 2018-12-03 LAB — COMPREHENSIVE METABOLIC PANEL
ALT: 24 U/L (ref 0–44)
AST: 27 U/L (ref 15–41)
Albumin: 3.9 g/dL (ref 3.5–5.0)
Alkaline Phosphatase: 69 U/L (ref 38–126)
Anion gap: 9 (ref 5–15)
BUN: 16 mg/dL (ref 8–23)
CO2: 26 mmol/L (ref 22–32)
Calcium: 9.5 mg/dL (ref 8.9–10.3)
Chloride: 105 mmol/L (ref 98–111)
Creatinine, Ser: 1.38 mg/dL — ABNORMAL HIGH (ref 0.44–1.00)
GFR calc Af Amer: 41 mL/min — ABNORMAL LOW (ref >60)
GFR calc non Af Amer: 36 mL/min — ABNORMAL LOW (ref >60)
Glucose, Bld: 183 mg/dL — ABNORMAL HIGH (ref 70–99)
Potassium: 3.7 mmol/L (ref 3.5–5.1)
Sodium: 140 mmol/L (ref 135–145)
Total Bilirubin: 0.8 mg/dL (ref 0.3–1.2)
Total Protein: 7 g/dL (ref 6.5–8.1)

## 2018-12-03 LAB — CBC WITH DIFFERENTIAL/PLATELET
Abs Immature Granulocytes: 0.02 10*3/uL (ref 0.00–0.07)
Basophils Absolute: 0 10*3/uL (ref 0.0–0.1)
Basophils Relative: 1 %
Eosinophils Absolute: 0 10*3/uL (ref 0.0–0.5)
Eosinophils Relative: 1 %
HCT: 43.7 % (ref 36.0–46.0)
Hemoglobin: 13.8 g/dL (ref 12.0–15.0)
Immature Granulocytes: 0 %
Lymphocytes Relative: 23 %
Lymphs Abs: 1.1 10*3/uL (ref 0.7–4.0)
MCH: 30.3 pg (ref 26.0–34.0)
MCHC: 31.6 g/dL (ref 30.0–36.0)
MCV: 95.8 fL (ref 80.0–100.0)
Monocytes Absolute: 0.3 10*3/uL (ref 0.1–1.0)
Monocytes Relative: 7 %
Neutro Abs: 3.3 10*3/uL (ref 1.7–7.7)
Neutrophils Relative %: 68 %
Platelets: 130 10*3/uL — ABNORMAL LOW (ref 150–400)
RBC: 4.56 MIL/uL (ref 3.87–5.11)
RDW: 13.9 % (ref 11.5–15.5)
WBC: 4.8 10*3/uL (ref 4.0–10.5)
nRBC: 0 % (ref 0.0–0.2)

## 2018-12-03 LAB — BRAIN NATRIURETIC PEPTIDE: B Natriuretic Peptide: 73 pg/mL (ref 0.0–100.0)

## 2018-12-03 LAB — I-STAT TROPONIN, ED: Troponin i, poc: 0 ng/mL (ref 0.00–0.08)

## 2018-12-03 MED ORDER — SODIUM CHLORIDE 0.9 % IV BOLUS
1000.0000 mL | Freq: Once | INTRAVENOUS | Status: AC
  Administered 2018-12-03: 1000 mL via INTRAVENOUS

## 2018-12-03 NOTE — ED Triage Notes (Signed)
Pt arrives to ED from home with complaints of brief syncopal episode since this afternoon while walking around at home with family. EMS reports pt was lowered to the ground by family, did not hit her head. Pt has hx of dementia, oriented x3 baseline. Pt's BP 80/52 upon EMS arrival, 250 ml NS given en route, repeat BP 140/70. Pt placed in position of comfort with bed locked and lowered, call bell in reach.

## 2018-12-03 NOTE — ED Notes (Signed)
Patient verbalizes understanding of discharge instructions. Opportunity for questioning and answers were provided. Armband removed by staff, pt discharged from ED.  

## 2018-12-03 NOTE — ED Notes (Signed)
Patient transported to CT 

## 2018-12-03 NOTE — ED Provider Notes (Signed)
[facility] EMERGENCY DEPARTMENT Provider Note   CSN: [id number] Arrival date & time: 12/03/18  1749    History   Chief Complaint Chief Complaint  Patient presents with  . Loss of Consciousness    HPI Caroline French is a 84 y.o. female.     The history is provided by the patient.  Near Syncope  This is a new problem. The current episode started less than 1 hour ago. The problem occurs rarely. The problem has been resolved. Pertinent negatives include no chest pain, no abdominal pain, no headaches and no shortness of breath. Nothing aggravates the symptoms. She has tried nothing for the symptoms. The treatment provided no relief.    Past Medical History:  Diagnosis Date  . Dementia with behavioral disturbance ([place])   . Diverticulitis   . Essential hypertension   . Glaucoma   . Hallucinations   . Overactive bladder     Patient Active Problem List   Diagnosis Date Noted  . Sleep apnea 10/19/2018  . Prediabetes 07/21/2018  . Hyperlipidemia 01/13/2018  . Dementia with behavioral disturbance ([place]) 01/13/2018  . Overactive bladder 01/13/2018  . Glaucoma 01/13/2018  . Essential hypertension 01/13/2018    Past Surgical History:  Procedure Laterality Date  . ABDOMINAL HYSTERECTOMY    . BREAST BIOPSY    . BREAST LUMPECTOMY Left 1990's     OB History   No obstetric history on file.      Home Medications    Prior to Admission medications   Medication Sig Start Date End Date Taking? Authorizing Provider  ASPIRIN LOW DOSE 81 MG EC tablet Take 1 tablet (81 mg total) by mouth daily. 10/03/18  Yes Pleas Koch, NP  atorvastatin (LIPITOR) 40 MG tablet Take 1 tablet (40 mg total) by mouth at bedtime. 09/29/18   Pleas Koch, NP  CALCIUM CITRATE + D 315-200 MG-UNIT tablet Take 1 tablet by mouth daily. 01/11/18   [provider]  donepezil (ARICEPT) 5 MG tablet Take 1 tablet by mouth every evening for memory. 07/21/18   Pleas Koch,  NP  dorzolamide-timolol (COSOPT) 22.3-6.8 MG/ML ophthalmic solution INSTILL 1 DROP INTO BOTH EYES TWICE A DAY AS DIRECTED 12/20/17   [provider]  fluticasone (FLONASE) 50 MCG/ACT nasal spray Place 1 spray into both nostrils 2 (two) times daily. As needed for allergies. 10/03/18   Pleas Koch, NP  latanoprost (XALATAN) 0.005 % ophthalmic solution INSTILL 1 DROP TO BOTH EYES EVERY EVENING AT BEDTIME 11/10/17   [provider]  lisinopril (PRINIVIL,ZESTRIL) 5 MG tablet Take 5 mg by mouth every morning. 01/11/18   [provider]  memantine (NAMENDA XR) 28 MG CP24 24 hr capsule Take 1 capsule by mouth once daily for memory. 07/21/18   Pleas Koch, NP  Multiple Vitamin (MULTI-VITAMINS) TABS Take 1 tablet by mouth daily. 01/11/18   [provider]  Omega-3 Fatty Acids (SEA-OMEGA 30) 1200 MG CAPS Take 1 capsule by mouth daily. 01/11/18   [provider]  oxybutynin (DITROPAN-XL) 5 MG 24 hr tablet Take 1 tablet (5 mg total) by mouth at bedtime. For urinary incontinence. 10/19/18   Pleas Koch, NP  sertraline (ZOLOFT) 50 MG tablet TAKE 1 TABLET BY MOUTH EVERY DAY 08/24/18   Pleas Koch, NP  STOOL SOFTENER 100 MG capsule TAKE TWO  2  CAPSULES BY MOUTH DAILY 01/11/18   [provider]    Family History History reviewed. No pertinent family history.  Social History Social History   Tobacco Use  . Smoking status: Never Smoker  . Smokeless tobacco: Never Used  Substance Use Topics  . Alcohol use: Never    Frequency: Never  . Drug use: Not on file     Allergies   Penicillins   Review of Systems Review of Systems  Constitutional: Negative for chills and fever.  HENT: Negative for ear pain and sore throat.   Eyes: Negative for pain and visual disturbance.  Respiratory: Negative for cough and shortness of breath.   Cardiovascular: Positive for near-syncope. Negative for chest pain and palpitations.  Gastrointestinal:  Negative for abdominal pain and vomiting.  Genitourinary: Negative for dysuria and hematuria.  Musculoskeletal: Negative for arthralgias and back pain.  Skin: Negative for color change and rash.  Neurological: Negative for seizures, syncope and headaches.  All other systems reviewed and are negative.    Physical Exam Updated Vital Signs BP 133/66   Pulse 70   Temp 98.1 F (36.7 C) (Oral)   Resp 19   Ht 5\' 8"  (1.727 m)   Wt 85.7 kg   SpO2 96%   BMI 28.73 kg/m   Physical Exam Vitals signs and nursing note reviewed.  Constitutional:      General: She is not in acute distress.    Appearance: She is well-developed.     Comments: Hemodynamically stable, GCS 15, no complaints at this time.  HENT:     Head: Normocephalic and atraumatic.  Eyes:     Extraocular Movements: Extraocular movements intact.     Conjunctiva/sclera: Conjunctivae normal.  Neck:     Musculoskeletal: Neck supple.     Comments: No C-spine tenderness Cardiovascular:     Rate and Rhythm: Normal rate and regular rhythm.     Heart sounds: No murmur.  Pulmonary:     Effort: Pulmonary effort is normal. No respiratory distress.     Breath sounds: Normal breath sounds.  Abdominal:     Palpations: Abdomen is soft.     Tenderness: There is no abdominal tenderness.  Musculoskeletal: Normal range of motion.        General: No tenderness.     Right lower leg: Edema present.     Left lower leg: Edema present.  Skin:    General: Skin is warm and dry.     Capillary Refill: Capillary refill takes less than 2 seconds.  Neurological:     General: No focal deficit present.     Mental Status: She is alert and oriented to person, place, and time. Mental status is at baseline.     Cranial Nerves: No cranial nerve deficit.     Sensory: No sensory deficit.     Motor: No weakness.     Coordination: Coordination normal.     Gait: Gait normal.     Deep Tendon Reflexes: Reflexes normal.  Psychiatric:        Mood and  Affect: Mood normal.      ED Treatments / Results  Labs (all labs ordered are listed, but only abnormal results are displayed) Labs Reviewed  COMPREHENSIVE METABOLIC PANEL - Abnormal; Notable for the following components:      Result Value   Glucose, Bld 183 (*)    Creatinine, Ser 1.38 (*)    GFR calc non Af Amer 36 (*)    GFR calc Af Amer 41 (*)    All other components within normal limits  CBC WITH DIFFERENTIAL/PLATELET - Abnormal; Notable for the following components:  Platelets 130 (*)    All other components within normal limits  URINALYSIS, ROUTINE W REFLEX MICROSCOPIC - Abnormal; Notable for the following components:   APPearance HAZY (*)    All other components within normal limits  BRAIN NATRIURETIC PEPTIDE  I-STAT TROPONIN, ED    EKG EKG Interpretation  Date/Time:  Sunday December 03 2018 17:51:48 EST Ventricular Rate:  72 PR Interval:    QRS Duration: 92 QT Interval:  473 QTC Calculation: 518 R Axis:   74 Text Interpretation:  Sinus rhythm Nonspecific T abnrm, anterolateral leads Prolonged QT interval Baseline wander in lead(s) V2 No previous ECGs available Confirmed by Yao, David H ([id number]) on 12/03/2018 6:00:40 PM   Radiology Ct Head Wo Contrast  Result Date: 12/03/2018 CLINICAL DATA:  Syncope EXAM: CT HEAD WITHOUT CONTRAST TECHNIQUE: Contiguous axial images were obtained from the base of the skull through the vertex without intravenous contrast. COMPARISON:  None. FINDINGS: Brain: There is no evidence for acute hemorrhage, hydrocephalus, mass lesion, or abnormal extra-axial fluid collection. No definite CT evidence for acute infarction. Diffuse loss of parenchymal volume is consistent with atrophy. Patchy low attenuation in the deep hemispheric and periventricular white matter is nonspecific, but likely reflects chronic microvascular ischemic demyelination. Vascular: No hyperdense vessel or unexpected calcification. Skull: No evidence for fracture. No worrisome  lytic or sclerotic lesion. Sinuses/Orbits: The visualized paranasal sinuses and mastoid air cells are clear. Visualized portions of the globes and intraorbital fat are unremarkable. Other: None. IMPRESSION: No acute intracranial abnormality. Atrophy with chronic small vessel white matter ischemic disease. Electronically Signed   By: Eric  Mansell M.D.   On: 12/03/2018 20:33   Dg Chest Portable 1 View  Result Date: 12/03/2018 CLINICAL DATA:  Syncope EXAM: PORTABLE CHEST 1 VIEW COMPARISON:  None. FINDINGS: Heart and mediastinal contours are within normal limits. No focal opacities or effusions. No acute bony abnormality. IMPRESSION: No active disease. Electronically Signed   By: Kevin  Dover M.D.   On: 12/03/2018 18:38    Procedures Procedures (including critical care time)  Medications Ordered in ED Medications  sodium chloride 0.9 % bolus 1,000 mL (0 mLs Intravenous Stopped 12/03/18 2107)     Initial Impression / Assessment and Plan / ED Course  I have reviewed the triage vital signs and the nursing notes.  Pertinent labs & imaging results that were available during my care of the patient were reviewed by me and considered in my medical decision making (see chart for details).        83  year old female with a past medical history of Dementia with behavioral disturbance ([place]), Diverticulitis, Essential hypertension, Glaucoma, Hallucinations, and Overactive bladder who presents with syncopal episode at home.  Patient was walking at home when she felt like she was in a pass out was laid to the ground by family, patient did not have total loss of consciousness.  First EMS pressure was 80 systolic however on repeat was at patient's normal baseline pressure.  Patient did not hit her head, was not a forceful trauma to the ground according to EMS was gently laid onto the ground no concern for trauma at this time.  Patient on presentation hemodynamically stable, non-tachycardic, at baseline.  Patient  denies any neurological symptoms, headache, vision changes.  Concern for vasovagal, cardiac, and other acute etiologies.  Physical exam was negative for any neurological findings, normal cardiac exam with pulses.  Will obtain laboratory studies, chest x-ray, urinalysis.  Patient in agreement with this plan.  Laboratory studies indicate mild  elevation in creatinine, appears prerenal likely dehydration related.  We will give fluid hydration here in the emergency department.  Laboratory studies also indicate negative troponin, appropriate BNP.  X-ray negative for any acute findings.  Patient's blood pressure continues to be appropriate after fluid hydration, patient continues to be at baseline.  EKG shows normal sinus rhythm, no ST elevation or depression, nondescript flipped T waves in V1, V3, no prior to compare, no signs of Brugada, WPW.  Family is going to work on better hydration, likely dehydration related.  Attending had long discussion with family.  Discussed with family who is in agreement with close follow-up for outpatient appointment. Patient and family in agreement with this plan.  Patient discharged in stable initial stable vital signs.  Strict return precautions given.  The above care was discussed and agreed upon by my attending physician.  Final Clinical Impressions(s) / ED Diagnoses   Final diagnoses:  Altered mental status, unspecified altered mental status type    ED Discharge Orders    None       Orson Aloe, MD 12/03/18 2352    Drenda Freeze, MD 12/04/18 [phone]

## 2018-12-20 ENCOUNTER — Telehealth: Payer: Self-pay | Admitting: Primary Care

## 2018-12-20 NOTE — Telephone Encounter (Signed)
Place paperwork in [place] to review.

## 2018-12-20 NOTE — Telephone Encounter (Signed)
Pt daughter dropped off [facility] ppw for pt to be filled out for her daycare center. I plavced in Rx tower. Please call when complete.

## 2018-12-22 NOTE — Telephone Encounter (Signed)
Spoken to patient's daughter and inform that Caroline French have completed her portion of the paperwork. Place in the front office for pick up.

## 2018-12-22 NOTE — Telephone Encounter (Signed)
Completed and placed in Chans inbox. 

## 2018-12-26 ENCOUNTER — Telehealth: Payer: Self-pay | Admitting: Primary Care

## 2018-12-26 DIAGNOSIS — N3281 Overactive bladder: Secondary | ICD-10-CM

## 2018-12-26 DIAGNOSIS — I10 Essential (primary) hypertension: Secondary | ICD-10-CM

## 2018-12-26 NOTE — Telephone Encounter (Signed)
Pt's daughter called requesting refills on Lisinopril 5mg  and Toviaz 8mg  to be called into [facility].

## 2018-12-27 MED ORDER — LISINOPRIL 5 MG PO TABS: 5.0000 mg | ORAL_TABLET | Freq: Every morning | ORAL | 2 refills | Status: DC

## 2018-12-27 MED ORDER — FESOTERODINE FUMARATE ER 8 MG PO TB24: 8.0000 mg | ORAL_TABLET | Freq: Every day | ORAL | 1 refills | Status: DC

## 2018-12-27 NOTE — Telephone Encounter (Signed)
Noted.  Refill sent to pharmacy. 

## 2018-12-27 NOTE — Telephone Encounter (Signed)
Both Rx have not been prescribed. Last seen on 10/19/2018

## 2019-01-01 ENCOUNTER — Telehealth: Payer: Self-pay | Admitting: Primary Care

## 2019-01-01 ENCOUNTER — Other Ambulatory Visit: Payer: Self-pay

## 2019-01-01 MED ORDER — LATANOPROST 0.005 % OP SOLN: OPHTHALMIC | 6 refills | Status: DC

## 2019-01-01 MED ORDER — DORZOLAMIDE HCL-TIMOLOL MAL 2-0.5 % OP SOLN: OPHTHALMIC | 2 refills | Status: DC

## 2019-01-01 NOTE — Telephone Encounter (Signed)
Caroline French (daughter) best number [phone]  Called to get a refill  Dorzolamide Latanoprost  [facility]  Pt completely out of dorzolamide

## 2019-01-24 ENCOUNTER — Ambulatory Visit (INDEPENDENT_AMBULATORY_CARE_PROVIDER_SITE_OTHER): Payer: Medicare Other

## 2019-01-24 DIAGNOSIS — Z0000 Encounter for general adult medical examination without abnormal findings: Secondary | ICD-10-CM

## 2019-01-26 NOTE — Progress Notes (Signed)
PCP notes:   Health maintenance:  Tetanus vaccine - postponed/insurance  Abnormal screenings:   None  Patient concerns:   Patient wants to discuss using Trulicity.   Nurse concerns:  PCP please advise daughter when to schedule lab appt  Next PCP appt:   01/30/19 @ 1420

## 2019-01-26 NOTE — Patient Instructions (Signed)
Caroline French , Thank you for taking time to come for your Medicare Wellness Visit. I appreciate your ongoing commitment to your health goals. Please review the following plan we discussed and let me know if I can assist you in the future.   These are the goals we discussed: Goals    . Patient Stated     Starting 01/24/2019, I will continue to take medications as prescribed.        This is a list of the screening recommended for you and due dates:  Health Maintenance  Topic Date Due  . Tetanus Vaccine  10/11/2019*  . DEXA scan (bone density measurement)  10/10/2020*  . Flu Shot  05/12/2019  . Pneumonia vaccines (2 of 2 - PCV13) 07/22/2019  *Topic was postponed. The date shown is not the original due date.   Preventive Care for Adults  A healthy lifestyle and preventive care can promote health and wellness. Preventive health guidelines for adults include the following key practices.  . A routine yearly physical is a good way to check with your health care provider about your health and preventive screening. It is a chance to share any concerns and updates on your health and to receive a thorough exam.  . Visit your dentist for a routine exam and preventive care every 6 months. Brush your teeth twice a day and floss once a day. Good oral hygiene prevents tooth decay and gum disease.  . The frequency of eye exams is based on your age, health, family medical history, use  of contact lenses, and other factors. Follow your health care provider's recommendations for frequency of eye exams.  . Eat a healthy diet. Foods like vegetables, fruits, whole grains, low-fat dairy products, and lean protein foods contain the nutrients you need without too many calories. Decrease your intake of foods high in solid fats, added sugars, and salt. Eat the right amount of calories for you. Get information about a proper diet from your health care provider, if necessary.  . Regular physical exercise is one of  the most important things you can do for your health. Most adults should get at least 150 minutes of moderate-intensity exercise (any activity that increases your heart rate and causes you to sweat) each week. In addition, most adults need muscle-strengthening exercises on 2 or more days a week.  Silver Sneakers may be a benefit available to you. To determine eligibility, you may visit the website: www.silversneakers.com or contact program at 336-433-4661 Mon-Fri between 8AM-8PM.   . Maintain a healthy weight. The body mass index (BMI) is a screening tool to identify possible weight problems. It provides an estimate of body fat based on height and weight. Your health care provider can find your BMI and can help you achieve or maintain a healthy weight.   For adults 20 years and older: ? A BMI below 18.5 is considered underweight. ? A BMI of 18.5 to 24.9 is normal. ? A BMI of 25 to 29.9 is considered overweight. ? A BMI of 30 and above is considered obese.   . Maintain normal blood lipids and cholesterol levels by exercising and minimizing your intake of saturated fat. Eat a balanced diet with plenty of fruit and vegetables. Blood tests for lipids and cholesterol should begin at age 61 and be repeated every 5 years. If your lipid or cholesterol levels are high, you are over 50, or you are at high risk for heart disease, you may need your cholesterol levels checked  more frequently. Ongoing high lipid and cholesterol levels should be treated with medicines if diet and exercise are not working.  . If you smoke, find out from your health care provider how to quit. If you do not use tobacco, please do not start.  . If you choose to drink alcohol, please do not consume more than 2 drinks per day. One drink is considered to be 12 ounces (355 mL) of beer, 5 ounces (148 mL) of wine, or 1.5 ounces (44 mL) of liquor.  . If you are 70-70 years old, ask your health care provider if you should take aspirin to  prevent strokes.  . Use sunscreen. Apply sunscreen liberally and repeatedly throughout the day. You should seek shade when your shadow is shorter than you. Protect yourself by wearing long sleeves, pants, a wide-brimmed hat, and sunglasses year round, whenever you are outdoors.  . Once a month, do a whole body skin exam, using a mirror to look at the skin on your back. Tell your health care provider of new moles, moles that have irregular borders, moles that are larger than a pencil eraser, or moles that have changed in shape or color.

## 2019-01-26 NOTE — Progress Notes (Signed)
Subjective:   Caroline French is a 84 y.o. female who presents for an Initial Medicare Annual Wellness Visit.  Review of Systems    N/A  Cardiac Risk Factors include: advanced age (>86men, >80 women);dyslipidemia;hypertension     Objective:    Today's Vitals   01/24/19 1440  PainSc: 0-No pain   There is no height or weight on file to calculate BMI.  Advanced Directives 01/24/2019 12/03/2018  Does Patient Have a Medical Advance Directive? No No  Would patient like information on creating a medical advance directive? No - Patient declined No - Patient declined    Current Medications (verified) Outpatient Encounter Medications as of 01/24/2019  Medication Sig   ASPIRIN LOW DOSE 81 MG EC tablet Take 1 tablet (81 mg total) by mouth daily.   atorvastatin (LIPITOR) 40 MG tablet Take 1 tablet (40 mg total) by mouth at bedtime.   CALCIUM CITRATE + D 315-200 MG-UNIT tablet Take 1 tablet by mouth daily.   donepezil (ARICEPT) 5 MG tablet Take 1 tablet by mouth every evening for memory.   dorzolamide-timolol (COSOPT) 22.3-6.8 MG/ML ophthalmic solution INSTILL 1 DROP INTO BOTH EYES TWICE A DAY AS DIRECTED   fesoterodine (TOVIAZ) 8 MG TB24 tablet Take 1 tablet (8 mg total) by mouth daily. For overactive bladder.   fluticasone (FLONASE) 50 MCG/ACT nasal spray Place 1 spray into both nostrils 2 (two) times daily. As needed for allergies.   latanoprost (XALATAN) 0.005 % ophthalmic solution INSTILL 1 DROP TO BOTH EYES EVERY EVENING AT BEDTIME   lisinopril (PRINIVIL,ZESTRIL) 5 MG tablet Take 1 tablet (5 mg total) by mouth every morning. For blood pressure.   memantine (NAMENDA XR) 28 MG CP24 24 hr capsule Take 1 capsule by mouth once daily for memory.   Multiple Vitamin (MULTI-VITAMINS) TABS Take 1 tablet by mouth daily.   Omega-3 Fatty Acids (SEA-OMEGA 30) 1200 MG CAPS Take 1 capsule by mouth daily.   oxybutynin (DITROPAN-XL) 5 MG 24 hr tablet Take 1 tablet (5 mg total) by mouth at  bedtime. For urinary incontinence.   sertraline (ZOLOFT) 50 MG tablet TAKE 1 TABLET BY MOUTH EVERY DAY   STOOL SOFTENER 100 MG capsule TAKE TWO  2  CAPSULES BY MOUTH DAILY   No facility-administered encounter medications on file as of 01/24/2019.     Allergies (verified) Penicillins   History: Past Medical History:  Diagnosis Date   Dementia with behavioral disturbance ([place])    Diverticulitis    Essential hypertension    Glaucoma    Hallucinations    Overactive bladder    Past Surgical History:  Procedure Laterality Date   ABDOMINAL HYSTERECTOMY     BREAST BIOPSY     BREAST LUMPECTOMY Left 1990's   History reviewed. No pertinent family history. Social History   Socioeconomic History   Marital status: Widowed    Spouse name: Not on file   Number of children: Not on file   Years of education: Not on file   Highest education level: Not on file  Occupational History   Not on file  Social Needs   Financial resource strain: Not on file   Food insecurity:    Worry: Not on file    Inability: Not on file   Transportation needs:    Medical: Not on file    Non-medical: Not on file  Tobacco Use   Smoking status: Never Smoker   Smokeless tobacco: Never Used  Substance and Sexual Activity   Alcohol use: Never  Frequency: Never   Drug use: Never   Sexual activity: Not Currently  Lifestyle   Physical activity:    Days per week: Not on file    Minutes per session: Not on file   Stress: Not on file  Relationships   Social connections:    Talks on phone: Not on file    Gets together: Not on file    Attends religious service: Not on file    Active member of club or organization: Not on file    Attends meetings of clubs or organizations: Not on file    Relationship status: Not on file  Other Topics Concern   Not on file  Social History Narrative   Widow.   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   Moved from [place].    Tobacco  Counseling Counseling given: No   Clinical Intake:  Pre-visit preparation completed: Yes  Pain : No/denies pain Pain Score: 0-No pain     Nutritional Risks: None  How often do you need to have someone help you when you read instructions, pamphlets, or other written materials from your doctor or pharmacy?: 5 - Always What is the last grade level you completed in school?: Master degree  Interpreter Needed?: No  Comments: pt lives with daughter Information entered by :: LPinson, LPN   Activities of Daily Living In your present state of health, do you have any difficulty performing the following activities: 01/24/2019  Hearing? N  Vision? N  Difficulty concentrating or making decisions? Y  Walking or climbing stairs? Y  Dressing or bathing? N  Doing errands, shopping? Y  Preparing Food and eating ? Y  Using the Toilet? N  In the past six months, have you accidently leaked urine? Y  Do you have problems with loss of bowel control? N  Managing your Medications? Y  Managing your Finances? Y  Housekeeping or managing your Housekeeping? Y  Some recent data might be hidden     Immunizations and Health Maintenance Immunization History  Administered Date(s) Administered   Influenza,inj,Quad PF,6+ Mos 07/21/2018   PPD Test 05/31/2018   Pneumococcal Polysaccharide-23 07/21/2018   There are no preventive care reminders to display for this patient.  Patient Care Team: Pleas Koch, NP as PCP - General (Internal Medicine)  Indicate any recent Medical Services you may have received from other than [facility] providers in the past year (date may be approximate).     Assessment:   This is a routine wellness examination for Caroline French.  Hearing/Vision screen Vision Screening Comments: Last vision exam in 2019  Dietary issues and exercise activities discussed: Current Exercise Habits: Home exercise routine, Type of exercise: walking, Time (Minutes): 45, Frequency (Times/Week):  7, Weekly Exercise (Minutes/Week): 315, Intensity: Mild, Exercise limited by: None identified  Goals     Patient Stated     Starting 01/24/2019, I will continue to take medications as prescribed.       Depression Screen PHQ 2/9 Scores 01/24/2019  PHQ - 2 Score 0  PHQ- 9 Score 0    Fall Risk Fall Risk  01/24/2019  Falls in the past year? 0   Cognitive Function: MMSE - Mini Mental State Exam 01/24/2019  Not completed: Unable to complete        Screening Tests Health Maintenance  Topic Date Due   TETANUS/TDAP  10/11/2019 (Originally 08/18/1954)   DEXA SCAN  10/10/2020 (Originally 08/18/2002)   INFLUENZA VACCINE  05/12/2019   PNA vac Low Risk Adult (2 of 2 -  PCV13) 07/22/2019     Plan:     I have personally reviewed, addressed, and noted the following in the patients chart:  A. Medical and social history B. Use of alcohol, tobacco or illicit drugs  C. Current medications and supplements D. Functional ability and status E.  Nutritional status F.  Physical activity G. Advance directives H. List of other physicians I.  Hospitalizations, surgeries, and ER visits in previous 12 months J.  Vitals (unless it is a telemedicine encounter) K. Screenings to include hearing, vision, cognitive, depression L. Referrals and appointments   In addition, I have reviewed and discussed with patient certain preventive protocols, quality metrics, and best practice recommendations. A written personalized care plan for preventive services and recommendations were provided to patient.  With patient's permission, we connected on 01/26/19 at 12:30 PM EDT by a video enabled telemedicine application. Two patient identifiers were used to ensure the encounter occurred with the correct person. .   Patient was in home and writer was in office.   Signed,   Lindell Noe, MHA, BS, LPN Health Coach

## 2019-01-28 NOTE — Progress Notes (Signed)
I reviewed health advisor's note, was available for consultation, and agree with documentation and plan.  

## 2019-01-30 ENCOUNTER — Ambulatory Visit (INDEPENDENT_AMBULATORY_CARE_PROVIDER_SITE_OTHER): Payer: Medicare Other | Admitting: Primary Care

## 2019-01-30 ENCOUNTER — Encounter: Payer: Self-pay | Admitting: Primary Care

## 2019-01-30 DIAGNOSIS — R7303 Prediabetes: Secondary | ICD-10-CM | POA: Diagnosis not present

## 2019-01-30 DIAGNOSIS — H409 Unspecified glaucoma: Secondary | ICD-10-CM | POA: Diagnosis not present

## 2019-01-30 DIAGNOSIS — E785 Hyperlipidemia, unspecified: Secondary | ICD-10-CM

## 2019-01-30 DIAGNOSIS — I10 Essential (primary) hypertension: Secondary | ICD-10-CM | POA: Diagnosis not present

## 2019-01-30 DIAGNOSIS — F0391 Unspecified dementia with behavioral disturbance: Secondary | ICD-10-CM | POA: Diagnosis not present

## 2019-01-30 DIAGNOSIS — N3281 Overactive bladder: Secondary | ICD-10-CM

## 2019-01-30 DIAGNOSIS — G4730 Sleep apnea, unspecified: Secondary | ICD-10-CM | POA: Diagnosis not present

## 2019-01-30 MED ORDER — SERTRALINE HCL 50 MG PO TABS: 50.0000 mg | ORAL_TABLET | Freq: Every day | ORAL | 3 refills | Status: DC

## 2019-01-30 MED ORDER — ATORVASTATIN CALCIUM 40 MG PO TABS: 40.0000 mg | ORAL_TABLET | Freq: Every day | ORAL | 3 refills | Status: DC

## 2019-01-30 MED ORDER — DONEPEZIL HCL 10 MG PO TABS: 10.0000 mg | ORAL_TABLET | Freq: Every day | ORAL | 3 refills | Status: AC

## 2019-01-30 NOTE — Assessment & Plan Note (Signed)
Following with ophthalmology, continue current regimen. 

## 2019-01-30 NOTE — Progress Notes (Signed)
Subjective:    Patient ID: Caroline French, female    DOB: [birth year], 84 y.o.   MRN: [id number]  HPI  Virtual Visit via Video Note  I connected with Caroline French on 01/30/19 at  2:20 PM EDT by a video enabled telemedicine application and verified that I am speaking with the correct person using two identifiers.   I discussed the limitations of evaluation and management by telemedicine and the availability of in person appointments. The patient expressed understanding and agreed to proceed. She is at home, I am in the office. Her family is with her today providing some information for HPI.  History of Present Illness:  Caroline French is an 84 year old female who presents today for follow up of chronic medical conditions. She had her [place] completed on 01/24/19.  1) Essential Hypertension: Currently managed on lisinopril 5 mg once daily. She denies chest pain, dizziness, shortness of breath.   BP Readings from Last 3 Encounters:  12/03/18 133/66  10/31/18 112/72  10/19/18 122/74     2) Prediabetes: A1C of 5.8 in October 2019. She is doing well with her diet, trying to minimize sweets and processed food.   3) Hyperlipidemia: Currently managed on atorvastatin 40 mg. Last lipid panel in April 2019 with LDL of 64. She is compliant to her atorvastatin daily. Denies myalgias.   4) Dementia with Behavorial Disturbance: Currently managed on donepezil 5 mg daily and memantine XR 28 mg daily. Also managed on Zolfot 50 mg for anxiety. Her daughter is with her today and endorses that she's doing well on this regimen.   5) Overactive Bladder: Currently managed on Toviaz 8 mg daily for which she's taken for years. She did try oxybutynin in early 2020 but the [name] was better overall. She does use pads in her underwear and is not saturating.    Observations/Objective:  Alert and oriented.  Speaking in complete sentences. Appears well.   Assessment and Plan:  See problem based charting.  Follow Up  Instructions:  We will contact you regarding a lab only appointment soon.  Continue your medications as prescribed.  It was a pleasure to see you today! Allie Bossier, NP-C    I discussed the assessment and treatment plan with the patient. The patient was provided an opportunity to ask questions and all were answered. The patient agreed with the plan and demonstrated an understanding of the instructions.   The patient was advised to call back or seek an in-person evaluation if the symptoms worsen or if the condition fails to improve as anticipated.     Pleas Koch, NP    Review of Systems  Eyes: Negative for visual disturbance.  Respiratory: Negative for shortness of breath.   Cardiovascular: Negative for chest pain.  Neurological: Negative for dizziness and headaches.  Psychiatric/Behavioral: Negative for hallucinations and sleep disturbance. The patient is not nervous/anxious.        Past Medical History:  Diagnosis Date  . Dementia with behavioral disturbance ([place])   . Diverticulitis   . Essential hypertension   . Glaucoma   . Hallucinations   . Overactive bladder      Social History   Socioeconomic History  . Marital status: Widowed    Spouse name: Not on file  . Number of children: Not on file  . Years of education: Not on file  . Highest education level: Not on file  Occupational History  . Not on file  Social Needs  . Emergency planning/management officer  strain: Not on file  . Food insecurity:    Worry: Not on file    Inability: Not on file  . Transportation needs:    Medical: Not on file    Non-medical: Not on file  Tobacco Use  . Smoking status: Never Smoker  . Smokeless tobacco: Never Used  Substance and Sexual Activity  . Alcohol use: Never    Frequency: Never  . Drug use: Never  . Sexual activity: Not Currently  Lifestyle  . Physical activity:    Days per week: Not on file    Minutes per session: Not on file  . Stress: Not on file  Relationships  .  Social connections:    Talks on phone: Not on file    Gets together: Not on file    Attends religious service: Not on file    Active member of club or organization: Not on file    Attends meetings of clubs or organizations: Not on file    Relationship status: Not on file  . Intimate partner violence:    Fear of current or ex partner: Not on file    Emotionally abused: Not on file    Physically abused: Not on file    Forced sexual activity: Not on file  Other Topics Concern  . Not on file  Social History Narrative   Widow.   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   Moved from [place].    Past Surgical History:  Procedure Laterality Date  . ABDOMINAL HYSTERECTOMY    . BREAST BIOPSY    . BREAST LUMPECTOMY Left 1990's    No family history on file.  Allergies  Allergen Reactions  . Penicillins Rash    Did it involve swelling of the face/tongue/throat, SOB, or low BP? Yes Did it involve sudden or severe rash/hives, skin peeling, or any reaction on the inside of your mouth or nose? Unk Did you need to seek medical attention at a hospital or doctor's office? Unk When did it last happen? "I was young" If all above answers are "NO", may proceed with cephalosporin use.     Current Outpatient Medications on File Prior to Visit  Medication Sig Dispense Refill  . ASPIRIN LOW DOSE 81 MG EC tablet Take 1 tablet (81 mg total) by mouth daily. 90 tablet 3  . CALCIUM CITRATE + D 315-200 MG-UNIT tablet Take 1 tablet by mouth daily.  11  . dorzolamide-timolol (COSOPT) 22.3-6.8 MG/ML ophthalmic solution INSTILL 1 DROP INTO BOTH EYES TWICE A DAY AS DIRECTED 10 mL 2  . fesoterodine (TOVIAZ) 8 MG TB24 tablet Take 1 tablet (8 mg total) by mouth daily. For overactive bladder. 90 tablet 1  . fluticasone (FLONASE) 50 MCG/ACT nasal spray Place 1 spray into both nostrils 2 (two) times daily. As needed for allergies. 16 g 3  . latanoprost (XALATAN) 0.005 % ophthalmic solution INSTILL 1 DROP TO BOTH EYES EVERY  EVENING AT BEDTIME 2.5 mL 6  . lisinopril (PRINIVIL,ZESTRIL) 5 MG tablet Take 1 tablet (5 mg total) by mouth every morning. For blood pressure. 90 tablet 2  . memantine (NAMENDA XR) 28 MG CP24 24 hr capsule Take 1 capsule by mouth once daily for memory. 90 capsule 3  . Multiple Vitamin (MULTI-VITAMINS) TABS Take 1 tablet by mouth daily.  11  . Omega-3 Fatty Acids (SEA-OMEGA 30) 1200 MG CAPS Take 1 capsule by mouth daily.  11  . STOOL SOFTENER 100 MG capsule TAKE TWO  2  CAPSULES BY MOUTH  DAILY  11   No current facility-administered medications on file prior to visit.     There were no vitals taken for this visit.   Objective:   Physical Exam  Constitutional: She appears well-nourished. She does not have a sickly appearance. She does not appear ill.  Respiratory: Effort normal. No respiratory distress.  Skin: Skin is dry.  Psychiatric: She has a normal mood and affect.           Assessment & Plan:

## 2019-01-30 NOTE — Assessment & Plan Note (Signed)
Repeat A1C pending, continue to monitor and work on diet.

## 2019-01-30 NOTE — Assessment & Plan Note (Signed)
Not checking at home, however, stable on prior visits.  Continue lisinopril 5 mg. BMP pending.

## 2019-01-30 NOTE — Assessment & Plan Note (Signed)
Appears well today, doing well on current regimen per daughter. Refills sent to pharmacy.

## 2019-01-30 NOTE — Assessment & Plan Note (Signed)
Repeat lipids pending. Continue atorvastatin.

## 2019-01-30 NOTE — Patient Instructions (Signed)
We will contact you regarding a lab only appointment soon.  Continue your medications as prescribed.  It was a pleasure to see you today! Allie Bossier, NP-C

## 2019-01-30 NOTE — Assessment & Plan Note (Signed)
Compliant to new CPAP machine and doing much better. Continue same.

## 2019-01-30 NOTE — Assessment & Plan Note (Signed)
Doing well on Toviaz, continue same.

## 2019-02-01 ENCOUNTER — Other Ambulatory Visit: Payer: Self-pay

## 2019-02-01 ENCOUNTER — Other Ambulatory Visit (INDEPENDENT_AMBULATORY_CARE_PROVIDER_SITE_OTHER): Payer: Medicare Other

## 2019-02-01 DIAGNOSIS — R7303 Prediabetes: Secondary | ICD-10-CM | POA: Diagnosis not present

## 2019-02-01 DIAGNOSIS — I10 Essential (primary) hypertension: Secondary | ICD-10-CM | POA: Diagnosis not present

## 2019-02-01 DIAGNOSIS — E785 Hyperlipidemia, unspecified: Secondary | ICD-10-CM | POA: Diagnosis not present

## 2019-02-01 LAB — LIPID PANEL
Cholesterol: 135 mg/dL (ref 0–200)
HDL: 57.9 mg/dL (ref >39.00)
LDL Cholesterol: 66 mg/dL (ref 0–99)
NonHDL: 77.46
Total CHOL/HDL Ratio: 2
Triglycerides: 58 mg/dL (ref 0.0–149.0)
VLDL: 11.6 mg/dL (ref 0.0–40.0)

## 2019-02-01 LAB — COMPREHENSIVE METABOLIC PANEL
ALT: 30 U/L (ref 0–35)
AST: 30 U/L (ref 0–37)
Albumin: 4 g/dL (ref 3.5–5.2)
Alkaline Phosphatase: 71 U/L (ref 39–117)
BUN: 13 mg/dL (ref 6–23)
CO2: 30 mEq/L (ref 19–32)
Calcium: 8.9 mg/dL (ref 8.4–10.5)
Chloride: 105 mEq/L (ref 96–112)
Creatinine, Ser: 1.07 mg/dL (ref 0.40–1.20)
GFR: 59.48 mL/min — ABNORMAL LOW (ref >60.00)
Glucose, Bld: 85 mg/dL (ref 70–99)
Potassium: 3.9 mEq/L (ref 3.5–5.1)
Sodium: 141 mEq/L (ref 135–145)
Total Bilirubin: 0.6 mg/dL (ref 0.2–1.2)
Total Protein: 6.9 g/dL (ref 6.0–8.3)

## 2019-02-01 LAB — HEMOGLOBIN A1C: Hgb A1c MFr Bld: 6.1 % (ref 4.6–6.5)

## 2019-02-07 ENCOUNTER — Ambulatory Visit: Payer: Medicare Other | Admitting: Internal Medicine

## 2019-03-13 DIAGNOSIS — R413 Other amnesia: Secondary | ICD-10-CM | POA: Diagnosis not present

## 2019-04-04 DIAGNOSIS — H2511 Age-related nuclear cataract, right eye: Secondary | ICD-10-CM | POA: Diagnosis not present

## 2019-04-04 DIAGNOSIS — H401111 Primary open-angle glaucoma, right eye, mild stage: Secondary | ICD-10-CM | POA: Diagnosis not present

## 2019-04-04 DIAGNOSIS — H353111 Nonexudative age-related macular degeneration, right eye, early dry stage: Secondary | ICD-10-CM | POA: Diagnosis not present

## 2019-04-04 DIAGNOSIS — Z01818 Encounter for other preprocedural examination: Secondary | ICD-10-CM | POA: Diagnosis not present

## 2019-04-16 ENCOUNTER — Ambulatory Visit: Payer: Medicare Other | Admitting: Internal Medicine

## 2019-04-17 ENCOUNTER — Telehealth: Payer: Self-pay

## 2019-04-17 NOTE — Telephone Encounter (Signed)
Noted and will evaluate.  

## 2019-04-17 NOTE — Telephone Encounter (Signed)
Caroline French (DPR signed) said that on 04/15/19 during the night Caroline French woke up and thought a snake was crawling on her legs; Caroline French went to Sharon's room and fell in hallway. There was no snake to be found and [name] and her husband helped Caroline French up and back to bed. on 04/16/19 Caroline French complained with rt arm hurting; took tylenol and no pain today. During the night last night Caroline French woke up [name] again because there was a hole in her mattress. [name] said there was no hole. Caroline French was assisted back to bed and Caroline French went back to sleep in approx 30'. [name] is not sure if Caroline French is dreaming or truly hallucinating. During the day Caroline French takes naps and has no problems. Caroline French had this problem several years ago. [name] does not want to take Caroline French to  [facility] or ED. [name] is afraid Caroline French is going to fall and hurt herself. When Caroline French fell on 04/15/19 Caroline French fell in hallway but there were steps near that that Caroline French cold have fallen down but Caroline French did not fall down the steps. No SI/HI. No covid symptoms, no travel and no known exposure to + covid. [name] scheduled in office appt with Gentry Fitz NP on 04/18/19 at 8:40. ED precautions given and [name] voiced understanding. FYI to Gentry Fitz NP.

## 2019-04-18 ENCOUNTER — Ambulatory Visit (INDEPENDENT_AMBULATORY_CARE_PROVIDER_SITE_OTHER): Payer: Medicare Other | Admitting: Primary Care

## 2019-04-18 ENCOUNTER — Encounter: Payer: Self-pay | Admitting: Primary Care

## 2019-04-18 ENCOUNTER — Other Ambulatory Visit: Payer: Self-pay

## 2019-04-18 ENCOUNTER — Ambulatory Visit (INDEPENDENT_AMBULATORY_CARE_PROVIDER_SITE_OTHER)
Admission: RE | Admit: 2019-04-18 | Discharge: 2019-04-18 | Disposition: A | Discharge status: Home / Self Care | Payer: Medicare Other | Source: Ambulatory Visit | Attending: Primary Care | Admitting: Primary Care

## 2019-04-18 VITALS — BP 126/86 | HR 67 | Temp 97.4°F | Ht 68.0 in | Wt 186.5 lb

## 2019-04-18 DIAGNOSIS — R410 Disorientation, unspecified: Secondary | ICD-10-CM | POA: Diagnosis not present

## 2019-04-18 DIAGNOSIS — M25511 Pain in right shoulder: Secondary | ICD-10-CM

## 2019-04-18 DIAGNOSIS — W19XXXA Unspecified fall, initial encounter: Secondary | ICD-10-CM | POA: Diagnosis not present

## 2019-04-18 DIAGNOSIS — F0391 Unspecified dementia with behavioral disturbance: Secondary | ICD-10-CM | POA: Diagnosis not present

## 2019-04-18 DIAGNOSIS — I10 Essential (primary) hypertension: Secondary | ICD-10-CM | POA: Diagnosis not present

## 2019-04-18 LAB — POC URINALSYSI DIPSTICK (AUTOMATED)
Bilirubin, UA: NEGATIVE
Blood, UA: NEGATIVE
Glucose, UA: NEGATIVE
Ketones, UA: NEGATIVE
Leukocytes, UA: NEGATIVE
Nitrite, UA: NEGATIVE
Protein, UA: NEGATIVE
Spec Grav, UA: 1.015 (ref 1.010–1.025)
Urobilinogen, UA: 0.2 E.U./dL
pH, UA: 7.5 (ref 5.0–8.0)

## 2019-04-18 NOTE — Assessment & Plan Note (Signed)
Stable in the office today.  Continue current regimen. 

## 2019-04-18 NOTE — Patient Instructions (Signed)
Contact the neurologist if her symptoms return and persist.  Make sure to keep rugs and cords out of the floor to prevent falls.  Please don't hesitate to call me if you have any questions.  It was a pleasure to see you today!

## 2019-04-18 NOTE — Progress Notes (Signed)
Subjective:    Patient ID: Caroline French, female    DOB: [birth year], 84 y.o.   MRN: [id number]  HPI  Ms. Vesely is an 84 year old female with a history of hypertension, sleep apnea, overactive bladder, dementia with behavorial disturbance, glaucoma, prediabetes who presents today with her family member for a chief complaint of hallucinations.  She is currently managed on donepezil 10 mg daily, memantine XR 28 mg daily, sertraline 50 mg daily. She is following with Neurology and was last evaluated on 03/13/2019.   Her family member called into our office yesterday endorsing that the patient woke up the night of 04/15/19 thinking she had a snake crawling on her legs, found patient in the hallway who had fallen. She woke up Monday night thinking there was a hole in her mattress when in fact this did not exist. She was brought in today given the fall and her acute symptoms.   Her family member has not reached out to her neurologist regarding new symptoms. Her family endorses that her symptoms have only occurred during the night. She will nap during the day without symptoms. She denies dysuria, urinary frequency, hematuria. She is compliant to her medications as prescribed. Last night she denies having problems last night with sleep. She does have some acute on chronic right shoulder pain with slight increase in stiffness since her fall two evenings ago.   BP Readings from Last 3 Encounters:  04/18/19 126/86  12/03/18 133/66  10/31/18 112/72     Review of Systems  Respiratory: Negative for shortness of breath.   Cardiovascular: Negative for chest pain.  Genitourinary: Negative for dysuria, frequency, hematuria and urgency.  Musculoskeletal: Positive for arthralgias.       Acute on chronic right shoulder pain  Neurological: Negative for dizziness and headaches.       Past Medical History:  Diagnosis Date  . Dementia with behavioral disturbance ([place])   . Diverticulitis   . Essential  hypertension   . Glaucoma   . Hallucinations   . Overactive bladder      Social History   Socioeconomic History  . Marital status: Widowed    Spouse name: Not on file  . Number of children: Not on file  . Years of education: Not on file  . Highest education level: Not on file  Occupational History  . Not on file  Social Needs  . Financial resource strain: Not on file  . Food insecurity    Worry: Not on file    Inability: Not on file  . Transportation needs    Medical: Not on file    Non-medical: Not on file  Tobacco Use  . Smoking status: Never Smoker  . Smokeless tobacco: Never Used  Substance and Sexual Activity  . Alcohol use: Never    Frequency: Never  . Drug use: Never  . Sexual activity: Not Currently  Lifestyle  . Physical activity    Days per week: Not on file    Minutes per session: Not on file  . Stress: Not on file  Relationships  . Social Herbalist on phone: Not on file    Gets together: Not on file    Attends religious service: Not on file    Active member of club or organization: Not on file    Attends meetings of clubs or organizations: Not on file    Relationship status: Not on file  . Intimate partner violence    Fear of  current or ex partner: Not on file    Emotionally abused: Not on file    Physically abused: Not on file    Forced sexual activity: Not on file  Other Topics Concern  . Not on file  Social History Narrative   Widow.   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   Moved from [place].    Past Surgical History:  Procedure Laterality Date  . ABDOMINAL HYSTERECTOMY    . BREAST BIOPSY    . BREAST LUMPECTOMY Left 1990's    No family history on file.  Allergies  Allergen Reactions  . Penicillins Rash    Did it involve swelling of the face/tongue/throat, SOB, or low BP? Yes Did it involve sudden or severe rash/hives, skin peeling, or any reaction on the inside of your mouth or nose? Unk Did you need to seek medical attention  at a hospital or doctor's office? Unk When did it last happen? "I was young" If all above answers are "NO", may proceed with cephalosporin use.     Current Outpatient Medications on File Prior to Visit  Medication Sig Dispense Refill  . ASPIRIN LOW DOSE 81 MG EC tablet Take 1 tablet (81 mg total) by mouth daily. 90 tablet 3  . atorvastatin (LIPITOR) 40 MG tablet Take 1 tablet (40 mg total) by mouth at bedtime. For cholesterol. 90 tablet 3  . CALCIUM CITRATE + D 315-200 MG-UNIT tablet Take 1 tablet by mouth daily.  11  . donepezil (ARICEPT) 10 MG tablet Take 1 tablet (10 mg total) by mouth at bedtime. For memory. 90 tablet 3  . dorzolamide-timolol (COSOPT) 22.3-6.8 MG/ML ophthalmic solution INSTILL 1 DROP INTO BOTH EYES TWICE A DAY AS DIRECTED 10 mL 2  . fesoterodine (TOVIAZ) 8 MG TB24 tablet Take 1 tablet (8 mg total) by mouth daily. For overactive bladder. 90 tablet 1  . fluticasone (FLONASE) 50 MCG/ACT nasal spray Place 1 spray into both nostrils 2 (two) times daily. As needed for allergies. 16 g 3  . latanoprost (XALATAN) 0.005 % ophthalmic solution INSTILL 1 DROP TO BOTH EYES EVERY EVENING AT BEDTIME 2.5 mL 6  . lisinopril (PRINIVIL,ZESTRIL) 5 MG tablet Take 1 tablet (5 mg total) by mouth every morning. For blood pressure. 90 tablet 2  . memantine (NAMENDA XR) 28 MG CP24 24 hr capsule Take 1 capsule by mouth once daily for memory. 90 capsule 3  . Multiple Vitamin (MULTI-VITAMINS) TABS Take 1 tablet by mouth daily.  11  . Omega-3 Fatty Acids (SEA-OMEGA 30) 1200 MG CAPS Take 1 capsule by mouth daily.  11  . sertraline (ZOLOFT) 50 MG tablet Take 1 tablet (50 mg total) by mouth daily. For anxiety. 90 tablet 3  . STOOL SOFTENER 100 MG capsule TAKE TWO  2  CAPSULES BY MOUTH DAILY  11   No current facility-administered medications on file prior to visit.     BP 126/86   Pulse 67   Temp (!) 97.4 F (36.3 C) (Temporal)   Ht 5\' 8"  (1.727 m)   Wt 186 lb 8 oz (84.6 kg)   SpO2 98%   BMI 28.36  kg/m    Objective:   Physical Exam  Constitutional: She appears well-nourished.  Neck: Neck supple.  Cardiovascular: Normal rate and regular rhythm.  Respiratory: Effort normal and breath sounds normal.  Musculoskeletal:     Right shoulder: She exhibits decreased range of motion and pain. She exhibits no tenderness, no bony tenderness and no deformity.  Comments: Chronic reduction in ROM to right shoulder with most planes of abduction. No bony tenderness.  Neurological: She is alert.  Oriented and does follow commands  Skin: Skin is warm and dry.  Psychiatric: She has a normal mood and affect.           Assessment & Plan:

## 2019-04-18 NOTE — Assessment & Plan Note (Addendum)
Acute change for two evenings in a row, no problems last night.   Rule out metabolic cause including UTI.  UA today negative. Culture sent.  Discussed options with daughter including adding low dose Trazodone last night. Given that she doesn't typically have difficulty sleeping and had no problems last night we will monitor for now. Continue current regimen for now.  I did recommend she touch base with the neurologist if symptoms begin to reoccur and we can rule out metabolic cause.   Check plain films today of right shoulder to rule out fracture.

## 2019-04-19 ENCOUNTER — Encounter: Payer: Self-pay | Admitting: Internal Medicine

## 2019-04-19 ENCOUNTER — Ambulatory Visit (INDEPENDENT_AMBULATORY_CARE_PROVIDER_SITE_OTHER): Payer: Medicare Other | Admitting: Internal Medicine

## 2019-04-19 DIAGNOSIS — G4733 Obstructive sleep apnea (adult) (pediatric): Secondary | ICD-10-CM

## 2019-04-19 LAB — URINE CULTURE
MICRO NUMBER:: 645932
Result:: NO GROWTH
SPECIMEN QUALITY:: ADEQUATE

## 2019-04-19 NOTE — Patient Instructions (Signed)
Continue CPAP as prescribed. 

## 2019-04-19 NOTE — Progress Notes (Signed)
Name: Caroline French MRN: [id number] DOB: [birth year]      I connected with the patient by video/telephone enabled telemedicine visit and verified that I am speaking with the correct person using two identifiers.    I discussed the limitations, risks, security and privacy concerns of performing an evaluation and management service by telemedicine and the availability of in-person appointments. I also discussed with the patient that there may be a patient responsible charge related to this service. The patient expressed understanding and agreed to proceed.  PATIENT AGREES AND CONFIRMS -YES   Other persons participating in the visit and their role in the encounter: Patient, nursing   Patient's location: Home Provider's location: Clinic   I discussed the limitations, risks, security and privacy concerns of performing an evaluation and management service by telephone and the availability of in person appointments. I also discussed with the patient that there may be a patient responsible charge related to this service. The patient expressed understanding and agreed to proceed.  This visit type was conducted due to national recommendations for restrictions regarding the COVID-19 Pandemic (e.g. social distancing).  This format is felt to be most appropriate for this patient at this time.  All issues noted in this document were discussed and addressed.        CONSULTATION DATE: 10/31/18 REFERRING MD : Carlis Abbott  CHIEF COMPLAINT: follow up OSA   HISTORY OF PRESENT ILLNESS: Patient  has been having sleep problems for many years Patient has been having excessive daytime sleepiness for a long time Patient has been having extreme fatigue and tiredness, lack of energy +  very Loud snoring every night + struggling breathe at night and gasps for air  Dx of OSA AHI 28 Severe OSA Started on CPAP    Discussed sleep data and reviewed with patient.  Encouraged proper weight management.  Discussed  driving precautions and its relationship with hypersomnolence.  Discussed operating dangerous equipment and its relationship with hypersomnolence.  Discussed sleep hygiene, and benefits of a fixed sleep waked time.  The importance of getting eight or more hours of sleep discussed with patient.  Discussed limiting the use of the computer and television before bedtime.  Decrease naps during the day, so night time sleep will become enhanced.  Limit caffeine, and sleep deprivation.  HTN, stroke, and heart failure are potential risk factors.       PAST MEDICAL HISTORY :   has a past medical history of Dementia with behavioral disturbance ([address]), Diverticulitis, Essential hypertension, Glaucoma, Hallucinations, and Overactive bladder.  has a past surgical history that includes Breast biopsy; Abdominal hysterectomy; and Breast lumpectomy (Left, 1990's). Prior to Admission medications   Medication Sig Start Date End Date Taking? Authorizing Provider  ASPIRIN LOW DOSE 81 MG EC tablet Take 1 tablet (81 mg total) by mouth daily. 10/03/18   Pleas Koch, NP  atorvastatin (LIPITOR) 40 MG tablet Take 1 tablet (40 mg total) by mouth at bedtime. 09/29/18   Pleas Koch, NP  CALCIUM CITRATE + D 315-200 MG-UNIT tablet Take 1 tablet by mouth daily. 01/11/18   [provider]  donepezil (ARICEPT) 5 MG tablet Take 1 tablet by mouth every evening for memory. 07/21/18   Pleas Koch, NP  dorzolamide-timolol (COSOPT) 22.3-6.8 MG/ML ophthalmic solution INSTILL 1 DROP INTO BOTH EYES TWICE A DAY AS DIRECTED 12/20/17   [provider]  fluticasone (FLONASE) 50 MCG/ACT nasal spray Place 1 spray into both nostrils 2 (two) times daily. As needed for  allergies. 10/03/18   Pleas Koch, NP  latanoprost (XALATAN) 0.005 % ophthalmic solution INSTILL 1 DROP TO BOTH EYES EVERY EVENING AT BEDTIME 11/10/17   [provider]  lisinopril (PRINIVIL,ZESTRIL) 5 MG tablet Take 5 mg by mouth  every morning. 01/11/18   [provider]  memantine (NAMENDA XR) 28 MG CP24 24 hr capsule Take 1 capsule by mouth once daily for memory. 07/21/18   Pleas Koch, NP  Multiple Vitamin (MULTI-VITAMINS) TABS Take 1 tablet by mouth daily. 01/11/18   [provider]  Omega-3 Fatty Acids (SEA-OMEGA 30) 1200 MG CAPS Take 1 capsule by mouth daily. 01/11/18   [provider]  oxybutynin (DITROPAN-XL) 5 MG 24 hr tablet Take 1 tablet (5 mg total) by mouth at bedtime. For urinary incontinence. 10/19/18   Pleas Koch, NP  sertraline (ZOLOFT) 50 MG tablet TAKE 1 TABLET BY MOUTH EVERY DAY 08/24/18   Pleas Koch, NP  STOOL SOFTENER 100 MG capsule TAKE TWO  2  CAPSULES BY MOUTH DAILY 01/11/18   [provider]   Allergies  Allergen Reactions  . Penicillins Rash    Did it involve swelling of the face/tongue/throat, SOB, or low BP? Yes Did it involve sudden or severe rash/hives, skin peeling, or any reaction on the inside of your mouth or nose? Unk Did you need to seek medical attention at a hospital or doctor's office? Unk When did it last happen? "I was young" If all above answers are "NO", may proceed with cephalosporin use.     FAMILY HISTORY:  +HTN SOCIAL HISTORY:  reports that she has never smoked. She has never used smokeless tobacco. She reports that she does not drink alcohol or use drugs.   Review of Systems:  Gen:  Denies  fever, sweats, chills weigh loss  HEENT: Denies blurred vision, double vision, ear pain, eye pain, hearing loss, nose bleeds, sore throat Cardiac:  No dizziness, chest pain or heaviness, chest tightness,edema, No JVD Resp:   No cough, -sputum production, -shortness of breath,-wheezing, -hemoptysis,  Gi: Denies swallowing difficulty, stomach pain, nausea or vomiting, diarrhea, constipation, bowel incontinence Gu:  Denies bladder incontinence, burning urine Ext:   Denies Joint pain, stiffness or swelling Skin: Denies  skin  rash, easy bruising or bleeding or hives Endoc:  Denies polyuria, polydipsia , polyphagia or weight change Psych:   Denies depression, insomnia or hallucinations  Other:  All other systems negative    ASSESSMENT / PLAN:  84 yo with severe Dx of OSA Continue CPAP as prescribed Patient uses and benefits from therapy   Total Time spent 24 mins   COVID-19 EDUCATION: The signs and symptoms of COVID-19 were discussed with the patient and how to seek care for testing.  The importance of social distancing was discussed today. Hand Washing Techniques and avoid touching face was advised.  MEDICATION ADJUSTMENTS/LABS AND TESTS ORDERED: Continue CPAP as prescribed   CURRENT MEDICATIONS REVIEWED AT LENGTH WITH PATIENT TODAY   Patient satisfied with Plan of action and management. All questions answered  Follow up in 6 months   Janace Decker Patricia Pesa, M.D.  [facility]  Medical Director [place] Director [facility] Cardio-Pulmonary Department

## 2019-05-07 DIAGNOSIS — H409 Unspecified glaucoma: Secondary | ICD-10-CM | POA: Diagnosis not present

## 2019-05-07 DIAGNOSIS — H401111 Primary open-angle glaucoma, right eye, mild stage: Secondary | ICD-10-CM | POA: Diagnosis not present

## 2019-05-07 DIAGNOSIS — H2511 Age-related nuclear cataract, right eye: Secondary | ICD-10-CM | POA: Diagnosis not present

## 2019-05-24 ENCOUNTER — Other Ambulatory Visit: Payer: Self-pay | Admitting: Primary Care

## 2019-05-24 DIAGNOSIS — N3281 Overactive bladder: Secondary | ICD-10-CM

## 2019-05-28 DIAGNOSIS — H2512 Age-related nuclear cataract, left eye: Secondary | ICD-10-CM | POA: Diagnosis not present

## 2019-05-28 DIAGNOSIS — H401122 Primary open-angle glaucoma, left eye, moderate stage: Secondary | ICD-10-CM | POA: Diagnosis not present

## 2019-05-28 DIAGNOSIS — H409 Unspecified glaucoma: Secondary | ICD-10-CM | POA: Diagnosis not present

## 2019-05-30 DIAGNOSIS — Z20828 Contact with and (suspected) exposure to other viral communicable diseases: Secondary | ICD-10-CM | POA: Diagnosis not present

## 2019-06-01 ENCOUNTER — Other Ambulatory Visit: Payer: Self-pay | Admitting: Primary Care

## 2019-06-26 ENCOUNTER — Other Ambulatory Visit: Payer: Self-pay | Admitting: Primary Care

## 2019-07-13 ENCOUNTER — Other Ambulatory Visit: Payer: Self-pay | Admitting: Primary Care

## 2019-07-13 DIAGNOSIS — F0391 Unspecified dementia with behavioral disturbance: Secondary | ICD-10-CM

## 2019-08-06 DIAGNOSIS — H401122 Primary open-angle glaucoma, left eye, moderate stage: Secondary | ICD-10-CM | POA: Diagnosis not present

## 2019-08-06 DIAGNOSIS — H401111 Primary open-angle glaucoma, right eye, mild stage: Secondary | ICD-10-CM | POA: Diagnosis not present

## 2019-08-22 ENCOUNTER — Other Ambulatory Visit: Payer: Self-pay | Admitting: Primary Care

## 2019-08-22 DIAGNOSIS — I10 Essential (primary) hypertension: Secondary | ICD-10-CM

## 2019-08-24 MED ORDER — LISINOPRIL 5 MG PO TABS: ORAL_TABLET | ORAL | 1 refills | Status: DC

## 2019-08-24 NOTE — Telephone Encounter (Signed)
Eddie Dibbles with [facility] called today-08/24/2019- to check status on Lisinopril refill. Advised per our records this was filled on 08/23/2019. Paul asked to resend this medication. I did verify the address and fax number which were correct, RX re sent

## 2019-08-24 NOTE — Addendum Note (Signed)
Addended by: Kris Mouton on: 08/24/2019 01:02 PM   Modules accepted: Orders

## 2019-09-10 ENCOUNTER — Ambulatory Visit: Payer: Self-pay

## 2019-09-10 NOTE — Telephone Encounter (Signed)
I spoke with [name] pts daughter (DPR signed) [name] said that pt did not loose consciousness or completely black out; pt was not responding initially but started to respond to questions when asked. Episode lasted 5 - 10 mins on Thanksgiving Day. After pt belched she felt better. Pt is pre diabetic. Today pt feels fine. Last time had this type episode prior to Thanksgiving Day was 2018. Pt has no covid symptoms, no travel and no known exposure to + covid. [name] request appt with Gentry Fitz NP only. Scheduled appt on 09/11/19 at 2 pm. Pt will be at [facility] at 1:45. UC & ED precautions given and [name] voice understanding.

## 2019-09-10 NOTE — Telephone Encounter (Signed)
Incoming call from Patient Caregiver and daughter. Reports that Patient blacked out Thanksgiving Day at [place] room table Has occurred one other time about a year ago.  Patient had completed dinner and then "went into a type of spell"  Wouldn't communicate, slumped down in chair. Attempted to call the office to make an appointment with  Alma Friendly, NP to make an appointment , no answer.  Patient and daughter daughter ) are waiting for a return  Call.   Reason for Disposition . [1] Symptoms of high blood sugar (e.g., frequent urination, weak, weight loss) AND [2] not able to test blood glucose  Answer Assessment - Initial Assessment Questions 1. BLOOD GLUCOSE: "What is your blood glucose level?"      Has no monitor to check  Blood sugar 2. ONSET: "When did you check the blood glucose?"     havent checked 3. USUAL RANGE: "What is your glucose level usually?" (e.g., usual fasting morning value, usual evening value)    Un diagnosed 4. KETONES: "Do you check for ketones (urine or blood test strips)?" If yes, ask: "What does the test show now?"      *No Answer* 5. TYPE 1 or 2:  "Do you know what type of diabetes you have?"  (e.g., Type 1, Type 2, Gestational; doesn't know)      *No Answer* 6. INSULIN: "Do you take insulin?" "What type of insulin(s) do you use? What is the mode of delivery? (syringe, pen; injection or pump)?"      *No Answer* 7. DIABETES PILLS: "Do you take any pills for your diabetes?" If yes, ask: "Have you missed taking any pills recently?"     *No Answer* 8. OTHER SYMPTOMS: "Do you have any symptoms?" (e.g., fever, frequent urination, difficulty breathing, dizziness, weakness, vomiting)    Sweating.  Frequent urination, weakness  Appears like she is in a trans 9. PREGNANCY: "Is there any chance you are pregnant?" "When was your last menstrual period?"    na  Protocols used: DIABETES - HIGH BLOOD SUGAR-A-AH

## 2019-09-10 NOTE — Telephone Encounter (Signed)
Noted, will evaluate. 

## 2019-09-11 ENCOUNTER — Encounter: Payer: Self-pay | Admitting: Primary Care

## 2019-09-11 ENCOUNTER — Other Ambulatory Visit: Payer: Self-pay

## 2019-09-11 ENCOUNTER — Ambulatory Visit (INDEPENDENT_AMBULATORY_CARE_PROVIDER_SITE_OTHER): Payer: Medicare Other | Admitting: Primary Care

## 2019-09-11 VITALS — BP 136/86 | HR 74 | Temp 97.4°F | Ht 68.0 in | Wt 198.5 lb

## 2019-09-11 DIAGNOSIS — E785 Hyperlipidemia, unspecified: Secondary | ICD-10-CM | POA: Diagnosis not present

## 2019-09-11 DIAGNOSIS — R4182 Altered mental status, unspecified: Secondary | ICD-10-CM | POA: Insufficient documentation

## 2019-09-11 DIAGNOSIS — R7303 Prediabetes: Secondary | ICD-10-CM

## 2019-09-11 HISTORY — DX: Altered mental status, unspecified: R41.82

## 2019-09-11 NOTE — Progress Notes (Signed)
Subjective:    Patient ID: Aron Klahn, female    DOB: [birth year], 84 y.o.   MRN: [id number]  HPI  Ms. Ottum is a 84 year old female with a history of hypertension, sleep apnea, dementia with behavorial disturbance, prediabetes, hyperlipidemia who presents today with her family with a chief complaint of unresponsiveness.   Her daughter called yesterday reporting that the patient "blacked out" at the dinning room table on Thanksgiving day. She endorsed the patient being unresponsive and "slumped down in chair" for about 5-10 minutes, belched loudly, then "felt better". She was asked to come in for evaluation today.  Today her daughter endorses that the patient never passed out but responded very minimally, did grasp a cup of water when directed. She also broke out in sweat, became clammy. After about five minutes she belched and then became responsive, then after 3-4 minutes was "back to her normal self".   Her mother has a history of this occurring in the past with her last episode being in August of 2018, belched and then responded. She thinks this occurs after eating large meals. The patient denies esophageal burning, abdominal pain, diarrhea, constipation, fevers, history of seizure disorder, urinary symptoms.   She has an appointment tomorrow with Neurology through [facility].   BP Readings from Last 3 Encounters:  09/11/19 136/86  04/18/19 126/86  12/03/18 133/66     Review of Systems  Gastrointestinal: Negative for abdominal pain.       Denies esophageal burning  Neurological: Negative for dizziness and headaches.       See HPI  Psychiatric/Behavioral:       See HPI       Past Medical History:  Diagnosis Date  . Dementia with behavioral disturbance ([place])   . Diverticulitis   . Essential hypertension   . Glaucoma   . Hallucinations   . Overactive bladder      Social History   Socioeconomic History  . Marital status: Widowed    Spouse name: Not on file  .  Number of children: Not on file  . Years of education: Not on file  . Highest education level: Not on file  Occupational History  . Not on file  Social Needs  . Financial resource strain: Not on file  . Food insecurity    Worry: Not on file    Inability: Not on file  . Transportation needs    Medical: Not on file    Non-medical: Not on file  Tobacco Use  . Smoking status: Never Smoker  . Smokeless tobacco: Never Used  Substance and Sexual Activity  . Alcohol use: Never    Frequency: Never  . Drug use: Never  . Sexual activity: Not Currently  Lifestyle  . Physical activity    Days per week: Not on file    Minutes per session: Not on file  . Stress: Not on file  Relationships  . Social Herbalist on phone: Not on file    Gets together: Not on file    Attends religious service: Not on file    Active member of club or organization: Not on file    Attends meetings of clubs or organizations: Not on file    Relationship status: Not on file  . Intimate partner violence    Fear of current or ex partner: Not on file    Emotionally abused: Not on file    Physically abused: Not on file    Forced  sexual activity: Not on file  Other Topics Concern  . Not on file  Social History Narrative   Widow.   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   Moved from [place].    Past Surgical History:  Procedure Laterality Date  . ABDOMINAL HYSTERECTOMY    . BREAST BIOPSY    . BREAST LUMPECTOMY Left 1990's    No family history on file.  Allergies  Allergen Reactions  . Penicillins Rash    Did it involve swelling of the face/tongue/throat, SOB, or low BP? Yes Did it involve sudden or severe rash/hives, skin peeling, or any reaction on the inside of your mouth or nose? Unk Did you need to seek medical attention at a hospital or doctor's office? Unk When did it last happen? "I was young" If all above answers are "NO", may proceed with cephalosporin use.     Current Outpatient  Medications on File Prior to Visit  Medication Sig Dispense Refill  . ASPIRIN LOW DOSE 81 MG EC tablet Take 1 tablet (81 mg total) by mouth daily. 90 tablet 3  . atorvastatin (LIPITOR) 40 MG tablet Take 1 tablet (40 mg total) by mouth at bedtime. For cholesterol. 90 tablet 3  . CALCIUM CITRATE + D 315-200 MG-UNIT tablet Take 1 tablet by mouth daily.  11  . donepezil (ARICEPT) 10 MG tablet Take 1 tablet (10 mg total) by mouth at bedtime. For memory. 90 tablet 3  . dorzolamide-timolol (COSOPT) 22.3-6.8 MG/ML ophthalmic solution INSTILL 1 DROP INTO BOTH EYES TWICE A DAY AS DIRECTED 10 mL 2  . fluticasone (FLONASE) 50 MCG/ACT nasal spray Place 1 spray into both nostrils 2 (two) times daily. As needed for allergies. 16 g 3  . latanoprost (XALATAN) 0.005 % ophthalmic solution INSTILL 1 DROP TO BOTH EYES EVERY EVENING AT BEDTIME (Patient taking differently: INSTILL 1 DROP TO LEFT EYE EVERY EVENING AT BEDTIME) 2.5 mL 5  . lisinopril (ZESTRIL) 5 MG tablet TAKE 1 TABLET BY MOUTH EVERY MORNING FOR BLOOD PRESSURE 90 tablet 1  . memantine (NAMENDA XR) 28 MG CP24 24 hr capsule TAKE ONE (1) CAPSULE BY MOUTH ONCE DAILY FOR MEMORY 90 capsule 1  . Multiple Vitamin (MULTI-VITAMINS) TABS Take 1 tablet by mouth daily.  11  . Omega-3 Fatty Acids (SEA-OMEGA 30) 1200 MG CAPS Take 1 capsule by mouth daily.  11  . sertraline (ZOLOFT) 50 MG tablet Take 1 tablet (50 mg total) by mouth daily. For anxiety. 90 tablet 3  . STOOL SOFTENER 100 MG capsule TAKE TWO  2  CAPSULES BY MOUTH DAILY  11  . TOVIAZ 8 MG TB24 tablet TAKE 1 TABLET BY MOUTH DAILY FOR OVERACTIVE BLADDER 90 tablet 1   No current facility-administered medications on file prior to visit.     BP 136/86   Pulse 74   Temp (!) 97.4 F (36.3 C) (Temporal)   Ht 5\' 8"  (1.727 m)   Wt 198 lb 8 oz (90 kg)   SpO2 98%   BMI 30.18 kg/m    Objective:   Physical Exam  Eyes: EOM are normal.  Cardiovascular: Normal rate and regular rhythm.  Respiratory: Effort  normal and breath sounds normal.  GI: Soft. There is no abdominal tenderness.  Neurological: She is alert. No cranial nerve deficit. Coordination normal.  Alert per norm, follows commands.  Skin: Skin is warm and dry.           Assessment & Plan:

## 2019-09-11 NOTE — Patient Instructions (Signed)
Stop by the lab prior to leaving today. I will notify you of your results once received.   Follow up with the Neurologist as scheduled tomorrow.  It was a pleasure to see you today!

## 2019-09-11 NOTE — Assessment & Plan Note (Signed)
Acute episode that resolved within 15-20 minutes.  Exam today stable, she appears her normal self.  Differentials include silent seizures, CVA, acute cystitis, etc.  Check labs today including UA, CBC with diff, Lipids, A1C, BMP. Would consider CT head but patient will be seeing her neurologist tomorrow. Last CT head from February 2020 reviewed and without acute process, just chronic microvascular changes.  She appears stable for outpatient treatment.

## 2019-09-12 LAB — CBC WITH DIFFERENTIAL/PLATELET
Basophils Absolute: 0.1 10*3/uL (ref 0.0–0.1)
Basophils Relative: 1.9 % (ref 0.0–3.0)
Eosinophils Absolute: 0.1 10*3/uL (ref 0.0–0.7)
Eosinophils Relative: 1.7 % (ref 0.0–5.0)
HCT: 41.9 % (ref 36.0–46.0)
Hemoglobin: 13.9 g/dL (ref 12.0–15.0)
Lymphocytes Relative: 33 % (ref 12.0–46.0)
Lymphs Abs: 1.5 10*3/uL (ref 0.7–4.0)
MCHC: 33.3 g/dL (ref 30.0–36.0)
MCV: 95.1 fl (ref 78.0–100.0)
Monocytes Absolute: 0.3 10*3/uL (ref 0.1–1.0)
Monocytes Relative: 7.1 % (ref 3.0–12.0)
Neutro Abs: 2.6 10*3/uL (ref 1.4–7.7)
Neutrophils Relative %: 56.3 % (ref 43.0–77.0)
Platelets: 151 10*3/uL (ref 150.0–400.0)
RBC: 4.4 Mil/uL (ref 3.87–5.11)
RDW: 15 % (ref 11.5–15.5)
WBC: 4.5 10*3/uL (ref 4.0–10.5)

## 2019-09-12 LAB — BASIC METABOLIC PANEL
BUN: 13 mg/dL (ref 6–23)
CO2: 30 mEq/L (ref 19–32)
Calcium: 9.7 mg/dL (ref 8.4–10.5)
Chloride: 104 mEq/L (ref 96–112)
Creatinine, Ser: 1.08 mg/dL (ref 0.40–1.20)
GFR: 58.76 mL/min — ABNORMAL LOW (ref >60.00)
Glucose, Bld: 97 mg/dL (ref 70–99)
Potassium: 3.9 mEq/L (ref 3.5–5.1)
Sodium: 141 mEq/L (ref 135–145)

## 2019-09-12 LAB — LIPID PANEL
Cholesterol: 158 mg/dL (ref 0–200)
HDL: 67.6 mg/dL (ref >39.00)
LDL Cholesterol: 77 mg/dL (ref 0–99)
NonHDL: 89.91
Total CHOL/HDL Ratio: 2
Triglycerides: 63 mg/dL (ref 0.0–149.0)
VLDL: 12.6 mg/dL (ref 0.0–40.0)

## 2019-09-12 LAB — HEMOGLOBIN A1C: Hgb A1c MFr Bld: 5.9 % (ref 4.6–6.5)

## 2019-09-18 DIAGNOSIS — R413 Other amnesia: Secondary | ICD-10-CM | POA: Diagnosis not present

## 2019-09-25 ENCOUNTER — Other Ambulatory Visit: Payer: Self-pay | Admitting: Primary Care

## 2019-09-25 DIAGNOSIS — E785 Hyperlipidemia, unspecified: Secondary | ICD-10-CM

## 2019-10-17 ENCOUNTER — Telehealth: Payer: Self-pay | Admitting: Primary Care

## 2019-10-17 NOTE — Telephone Encounter (Signed)
Absolutely.

## 2019-10-17 NOTE — Telephone Encounter (Signed)
Spoken and notified patient of Kate Clark's comments. Patient verbalized understanding.  

## 2019-10-17 NOTE — Telephone Encounter (Signed)
Patient's daughter,Sharon,called.  [facility] is offering the covid vaccine tomorrow. [name] wants to know if it's okay for patient to get the vaccine.

## 2019-10-18 ENCOUNTER — Other Ambulatory Visit: Payer: Self-pay | Admitting: Primary Care

## 2019-10-18 DIAGNOSIS — I10 Essential (primary) hypertension: Secondary | ICD-10-CM

## 2019-10-20 ENCOUNTER — Other Ambulatory Visit: Payer: Self-pay | Admitting: Primary Care

## 2019-10-20 DIAGNOSIS — I10 Essential (primary) hypertension: Secondary | ICD-10-CM

## 2019-10-23 DIAGNOSIS — D329 Benign neoplasm of meninges, unspecified: Secondary | ICD-10-CM | POA: Diagnosis not present

## 2019-10-26 ENCOUNTER — Other Ambulatory Visit: Payer: Self-pay | Admitting: Lab

## 2019-11-12 DIAGNOSIS — H401122 Primary open-angle glaucoma, left eye, moderate stage: Secondary | ICD-10-CM | POA: Diagnosis not present

## 2019-11-12 DIAGNOSIS — H401111 Primary open-angle glaucoma, right eye, mild stage: Secondary | ICD-10-CM | POA: Diagnosis not present

## 2019-11-14 ENCOUNTER — Telehealth: Payer: Self-pay | Admitting: Primary Care

## 2019-11-14 ENCOUNTER — Other Ambulatory Visit: Payer: Self-pay | Admitting: Primary Care

## 2019-11-14 DIAGNOSIS — Z9189 Other specified personal risk factors, not elsewhere classified: Secondary | ICD-10-CM | POA: Diagnosis not present

## 2019-11-14 DIAGNOSIS — Z1231 Encounter for screening mammogram for malignant neoplasm of breast: Secondary | ICD-10-CM

## 2019-11-14 DIAGNOSIS — N3281 Overactive bladder: Secondary | ICD-10-CM

## 2019-11-15 NOTE — Telephone Encounter (Signed)
Patient scheduled.

## 2019-11-15 NOTE — Telephone Encounter (Signed)
Last prescribed on 05/24/2019 . Last appointment on 09/11/2019. No future appointment

## 2019-11-15 NOTE — Telephone Encounter (Signed)
Patient will be due for general follow up visit in April/May 2021, please schedule.

## 2019-12-14 ENCOUNTER — Other Ambulatory Visit: Payer: Self-pay

## 2019-12-14 ENCOUNTER — Other Ambulatory Visit: Payer: Self-pay | Admitting: Primary Care

## 2019-12-14 ENCOUNTER — Telehealth: Payer: Self-pay

## 2019-12-14 ENCOUNTER — Ambulatory Visit
Admission: RE | Admit: 2019-12-14 | Discharge: 2019-12-14 | Disposition: A | Discharge status: Home / Self Care | Payer: Medicare Other | Source: Ambulatory Visit | Attending: Primary Care | Admitting: Primary Care

## 2019-12-14 ENCOUNTER — Ambulatory Visit: Admission: RE | Admit: 2019-12-14 | Payer: Medicare Other | Source: Ambulatory Visit

## 2019-12-14 DIAGNOSIS — Z1231 Encounter for screening mammogram for malignant neoplasm of breast: Secondary | ICD-10-CM | POA: Diagnosis not present

## 2019-12-14 DIAGNOSIS — Z853 Personal history of malignant neoplasm of breast: Secondary | ICD-10-CM | POA: Diagnosis not present

## 2019-12-14 DIAGNOSIS — N6452 Nipple discharge: Secondary | ICD-10-CM

## 2019-12-14 DIAGNOSIS — R928 Other abnormal and inconclusive findings on diagnostic imaging of breast: Secondary | ICD-10-CM | POA: Diagnosis not present

## 2019-12-14 HISTORY — DX: Malignant neoplasm of unspecified site of unspecified female breast: C50.919

## 2019-12-14 NOTE — Telephone Encounter (Signed)
Noted, orders signed

## 2019-12-14 NOTE — Telephone Encounter (Signed)
Received a call from [facility] about changing screening mammogram to diagnostic mammogram. Patient is there now and she is having brown discharge from her nipple, breast that she had cancer in before. I spoke with Anda Kraft and gave verbal order for the change.

## 2019-12-18 ENCOUNTER — Other Ambulatory Visit: Payer: Medicare Other

## 2019-12-18 ENCOUNTER — Ambulatory Visit: Payer: Medicare Other

## 2019-12-18 ENCOUNTER — Telehealth: Payer: Self-pay | Admitting: Primary Care

## 2019-12-18 DIAGNOSIS — F0391 Unspecified dementia with behavioral disturbance: Secondary | ICD-10-CM

## 2019-12-18 DIAGNOSIS — J302 Other seasonal allergic rhinitis: Secondary | ICD-10-CM

## 2019-12-18 MED ORDER — FLUTICASONE PROPIONATE 50 MCG/ACT NA SUSP: 1.0000 | Freq: Two times a day (BID) | NASAL | 1 refills | Status: DC

## 2019-12-18 MED ORDER — MEMANTINE HCL ER 28 MG PO CP24: ORAL_CAPSULE | ORAL | 1 refills | Status: DC

## 2019-12-18 NOTE — Telephone Encounter (Signed)
Patient's daughter called She stated the patient has no more refills at her mail order pharmacy  Fluticasone nasal spray & Memantine 28mg      [facility]

## 2019-12-18 NOTE — Telephone Encounter (Signed)
Refill as requested 

## 2019-12-25 ENCOUNTER — Ambulatory Visit (INDEPENDENT_AMBULATORY_CARE_PROVIDER_SITE_OTHER): Payer: Medicare Other | Admitting: Internal Medicine

## 2019-12-25 ENCOUNTER — Encounter: Payer: Self-pay | Admitting: Internal Medicine

## 2019-12-25 VITALS — BP 126/62 | HR 67 | Ht 64.0 in | Wt 193.8 lb

## 2019-12-25 DIAGNOSIS — G4733 Obstructive sleep apnea (adult) (pediatric): Secondary | ICD-10-CM | POA: Diagnosis not present

## 2019-12-25 NOTE — Progress Notes (Signed)
Name: Caroline French MRN: [id number] DOB: Jan 14, 1935      I connected with the patient by video/telephone enabled telemedicine visit and verified that I am speaking with the correct person using two identifiers.    I discussed the limitations, risks, security and privacy concerns of performing an evaluation and management service by telemedicine and the availability of in-person appointments. I also discussed with the patient that there may be a patient responsible charge related to this service. The patient expressed understanding and agreed to proceed.  PATIENT AGREES AND CONFIRMS -YES   Other persons participating in the visit and their role in the encounter: Patient, nursing   Patient's location: Home Provider's location: Clinic   I discussed the limitations, risks, security and privacy concerns of performing an evaluation and management service by telephone and the availability of in person appointments. I also discussed with the patient that there may be a patient responsible charge related to this service. The patient expressed understanding and agreed to proceed.  This visit type was conducted due to national recommendations for restrictions regarding the COVID-19 Pandemic (e.g. social distancing).  This format is felt to be most appropriate for this patient at this time.  All issues noted in this document were discussed and addressed.        CONSULTATION DATE: 10/31/18 REFERRING MD : Carlis Abbott  CHIEF COMPLAINT:  Follow-up OSA    HISTORY OF PRESENT ILLNESS: Patient has a diagnosis of severe sleep apnea with AHI of 28 Patient has sleep problems for many years Patient doing well with her CPAP machine Auto CPAP 5-20 AHI is down to 4 100% compliance for days and greater than 4 hours     Discussed sleep data and reviewed with patient.  Encouraged proper weight management.  Discussed driving precautions and its relationship with hypersomnolence.  Discussed operating dangerous  equipment and its relationship with hypersomnolence.  Discussed sleep hygiene, and benefits of a fixed sleep waked time.  The importance of getting eight or more hours of sleep discussed with patient.  Discussed limiting the use of the computer and television before bedtime.  Decrease naps during the day, so night time sleep will become enhanced.  Limit caffeine, and sleep deprivation.  HTN, stroke, and heart failure are potential risk factors.         PAST MEDICAL HISTORY :   has a past medical history of Breast cancer ([place]) [id number]), Dementia with behavioral disturbance ([place]), Diverticulitis, Essential hypertension, Glaucoma, Hallucinations, and Overactive bladder.  has a past surgical history that includes Breast biopsy; Abdominal hysterectomy; and Breast lumpectomy (Left, 1990's). Prior to Admission medications   Medication Sig Start Date End Date Taking? Authorizing Provider  ASPIRIN LOW DOSE 81 MG EC tablet Take 1 tablet (81 mg total) by mouth daily. 10/03/18   Pleas Koch, NP  atorvastatin (LIPITOR) 40 MG tablet Take 1 tablet (40 mg total) by mouth at bedtime. 09/29/18   Pleas Koch, NP  CALCIUM CITRATE + D 315-200 MG-UNIT tablet Take 1 tablet by mouth daily. 01/11/18   [provider]  donepezil (ARICEPT) 5 MG tablet Take 1 tablet by mouth every evening for memory. 07/21/18   Pleas Koch, NP  dorzolamide-timolol (COSOPT) 22.3-6.8 MG/ML ophthalmic solution INSTILL 1 DROP INTO BOTH EYES TWICE A DAY AS DIRECTED 12/20/17   [provider]  fluticasone (FLONASE) 50 MCG/ACT nasal spray Place 1 spray into both nostrils 2 (two) times daily. As needed for allergies. 10/03/18   Pleas Koch, NP  latanoprost (XALATAN) 0.005 % ophthalmic solution INSTILL 1 DROP TO BOTH EYES EVERY EVENING AT BEDTIME 11/10/17   [provider]  lisinopril (PRINIVIL,ZESTRIL) 5 MG tablet Take 5 mg by mouth every morning. 01/11/18   [provider]  memantine  (NAMENDA XR) 28 MG CP24 24 hr capsule Take 1 capsule by mouth once daily for memory. 07/21/18   Pleas Koch, NP  Multiple Vitamin (MULTI-VITAMINS) TABS Take 1 tablet by mouth daily. 01/11/18   [provider]  Omega-3 Fatty Acids (SEA-OMEGA 30) 1200 MG CAPS Take 1 capsule by mouth daily. 01/11/18   [provider]  oxybutynin (DITROPAN-XL) 5 MG 24 hr tablet Take 1 tablet (5 mg total) by mouth at bedtime. For urinary incontinence. 10/19/18   Pleas Koch, NP  sertraline (ZOLOFT) 50 MG tablet TAKE 1 TABLET BY MOUTH EVERY DAY 08/24/18   Pleas Koch, NP  STOOL SOFTENER 100 MG capsule TAKE TWO  2  CAPSULES BY MOUTH DAILY 01/11/18   [provider]   Allergies  Allergen Reactions  . Penicillins Rash    Did it involve swelling of the face/tongue/throat, SOB, or low BP? Yes Did it involve sudden or severe rash/hives, skin peeling, or any reaction on the inside of your mouth or nose? Unk Did you need to seek medical attention at a hospital or doctor's office? Unk When did it last happen? "I was young" If all above answers are "NO", may proceed with cephalosporin use.     FAMILY HISTORY:  +HTN SOCIAL HISTORY:  reports that she has never smoked. She has never used smokeless tobacco. She reports that she does not drink alcohol or use drugs.   Review of Systems:  Gen:  Denies  fever, sweats, chills weight loss  HEENT: Denies blurred vision, double vision, ear pain, eye pain, hearing loss, nose bleeds, sore throat Cardiac:  No dizziness, chest pain or heaviness, chest tightness,edema, No JVD Resp:   No cough, -sputum production, -shortness of breath,-wheezing, -hemoptysis,  Gi: Denies swallowing difficulty, stomach pain, nausea or vomiting, diarrhea, constipation, bowel incontinence Gu:  Denies bladder incontinence, burning urine Ext:   Denies Joint pain, stiffness or swelling Skin: Denies  skin rash, easy bruising or bleeding or hives Endoc:  Denies  polyuria, polydipsia , polyphagia or weight change Psych:   Denies depression, insomnia or hallucinations  Other:  All other systems negative BP 126/62 (BP Location: Left Arm, Cuff Size: Normal)   Pulse 67   Ht 5\' 4"  (1.626 m)   Wt 193 lb 12.8 oz (87.9 kg)   SpO2 97%   BMI 33.27 kg/m   Physical Examination:   General Appearance: No distress  Neuro:without focal findings,  speech normal,  HEENT: PERRLA, EOM intact.   Pulmonary: normal breath sounds, No wheezing.  CardiovascularNormal S1,S2.  No m/r/g.   Abdomen: Benign, Soft, non-tender. Renal:  No costovertebral tenderness  GU:  Not performed at this time. Endoc: No evident thyromegaly Skin:   warm, no rashes, no ecchymosis  Extremities: normal, no cyanosis, clubbing. PSYCHIATRIC: Mood, affect within normal limits.   ALL OTHER ROS ARE NEGATIVE     ASSESSMENT / PLAN:  Severe sleep apnea Continue CPAP as prescribed Patient has excellent compliance report AHI down to 4 She uses and benefits from CPAP therapy   Total Time spent 12  mins   COVID-19 EDUCATION: The signs and symptoms of COVID-19 were discussed with the patient and how to seek care for testing.  The importance of social  distancing was discussed today. Hand Washing Techniques and avoid touching face was advised.  MEDICATION ADJUSTMENTS/LABS AND TESTS ORDERED: Continue CPAP as prescribed   CURRENT MEDICATIONS REVIEWED AT LENGTH WITH PATIENT TODAY   Patient satisfied with Plan of action and management. All questions answered  Follow up in 1 year   Rhylei Mcquaig Patricia Pesa, M.D.  [facility]  Medical Director [place] Director [facility] Cardio-Pulmonary Department

## 2019-12-25 NOTE — Patient Instructions (Signed)
EXCELLENT JOB!!!! A+  KEEP UP THE GREAT WORK!!!   CONTINUE CPAP AS PRESCRIBED AND DURING NAP TIME

## 2020-01-21 ENCOUNTER — Telehealth: Payer: Self-pay | Admitting: Primary Care

## 2020-01-21 DIAGNOSIS — F418 Other specified anxiety disorders: Secondary | ICD-10-CM

## 2020-01-21 DIAGNOSIS — F0391 Unspecified dementia with behavioral disturbance: Secondary | ICD-10-CM

## 2020-01-21 NOTE — Telephone Encounter (Signed)
Pt daughter called again regarding patient traveling - they need to confirm flight tickets.   Please advise, thanks.

## 2020-01-21 NOTE — Telephone Encounter (Signed)
Patient's Daughter [name] called today. She stated they had a death in the family and they need to travel to be with them Patient has dementia and [name] would like to know if you think it is okay for her to travel.   [name] asked that if you think it is okay for travel, could a prescription be sent in for anxiety She stated the patient does not do well traveling.   Please advise

## 2020-01-21 NOTE — Telephone Encounter (Signed)
Please notify patient's daughter that we are very sorry to hear about the death in the family. Given Covid-19 pandemic it's risky for the patient to travel, especially if she'll be gathering with large groups of people at a funeral. If there is an option to travel without the patient then that is my recommendation.

## 2020-01-22 MED ORDER — HYDROXYZINE HCL 10 MG PO TABS: ORAL_TABLET | ORAL | 0 refills | Status: DC

## 2020-01-22 NOTE — Telephone Encounter (Signed)
Noted. Rx for hydroxyzine sent to pharmacy. She can give her 1-2 tablets by mouth about 30-60 minutes prior to travel. This will make her drowsy, start with 1 tablet.

## 2020-01-22 NOTE — Telephone Encounter (Signed)
Noted.  Where are they flying to? I want to make sure we give her the longest effective medication.  Are they having to change planes at all?

## 2020-01-22 NOTE — Addendum Note (Signed)
Addended by: Pleas Koch on: 01/22/2020 05:19 PM   Modules accepted: Orders

## 2020-01-22 NOTE — Telephone Encounter (Signed)
Spoken to [employer]. It is a Tax adviser from [place] to [place], the flight should be about 1 hour 1/2 hour long.

## 2020-01-22 NOTE — Telephone Encounter (Signed)
Spoken and notified patient's daughter of Tawni Millers comments. Patient's daughter verbalized understanding.  She appreciated the recommendation but she going have to bring patient with her.   Sharon's asked if you could prescribed Xanax or something in that nature for patient to go on the airplane. Please advise

## 2020-01-23 NOTE — Telephone Encounter (Signed)
Spoken and notified patient's daughter of Kate Clark's comments. Patient's daughter verbalized understanding.  

## 2020-01-29 ENCOUNTER — Ambulatory Visit (INDEPENDENT_AMBULATORY_CARE_PROVIDER_SITE_OTHER): Payer: Medicare Other | Admitting: Primary Care

## 2020-01-29 ENCOUNTER — Other Ambulatory Visit: Payer: Self-pay

## 2020-01-29 ENCOUNTER — Encounter: Payer: Self-pay | Admitting: Primary Care

## 2020-01-29 VITALS — BP 116/66 | HR 72 | Temp 96.8°F | Ht 64.0 in | Wt 195.0 lb

## 2020-01-29 DIAGNOSIS — N3281 Overactive bladder: Secondary | ICD-10-CM

## 2020-01-29 DIAGNOSIS — R7303 Prediabetes: Secondary | ICD-10-CM

## 2020-01-29 DIAGNOSIS — G4730 Sleep apnea, unspecified: Secondary | ICD-10-CM | POA: Diagnosis not present

## 2020-01-29 DIAGNOSIS — F0391 Unspecified dementia with behavioral disturbance: Secondary | ICD-10-CM

## 2020-01-29 DIAGNOSIS — K219 Gastro-esophageal reflux disease without esophagitis: Secondary | ICD-10-CM

## 2020-01-29 DIAGNOSIS — I10 Essential (primary) hypertension: Secondary | ICD-10-CM | POA: Diagnosis not present

## 2020-01-29 DIAGNOSIS — E785 Hyperlipidemia, unspecified: Secondary | ICD-10-CM

## 2020-01-29 DIAGNOSIS — R4182 Altered mental status, unspecified: Secondary | ICD-10-CM

## 2020-01-29 MED ORDER — OMEPRAZOLE 20 MG PO CPDR: 20.0000 mg | DELAYED_RELEASE_CAPSULE | Freq: Every day | ORAL | 0 refills | Status: DC

## 2020-01-29 MED ORDER — SERTRALINE HCL 25 MG PO TABS: 25.0000 mg | ORAL_TABLET | Freq: Every day | ORAL | 0 refills | Status: DC

## 2020-01-29 NOTE — Patient Instructions (Addendum)
We've increased your sertraline to 75 mg, take both the 50 mg and 25 mg tablet for anxiety/nightmares.  Start omeprazole 20 mg once daily for heartburn/food related symptoms as discussed.  Follow up with Dr. Melrose Nakayama and the neurosurgeon as discussed.  Please update me as discussed.  It was a pleasure to see you today!

## 2020-01-29 NOTE — Progress Notes (Signed)
Subjective:    Patient ID: Caroline French, female    DOB: 1935-10-02, 85 y.o.   MRN: [id number]  HPI  This visit occurred during the SARS-CoV-2 public health emergency.  Safety protocols were in place, including screening questions prior to the visit, additional usage of staff PPE, and extensive cleaning of exam room while observing appropriate contact time as indicated for disinfecting solutions.   Caroline French is a 85 year old female with a history of sleep apnea, hypertension, dementia with behavorial disturbance, overactive bladder, hyperlipidemia who presents today with her daughter for follow up.  1) Unresponsiveness/Belching: She has a history of decreased responsiveess during meals, will slump down in her chair for a few minutes, then belch, then "back to normal". This has occurred in August 2018, and Thanksgiving 2021. Most of the time she will belch and then will be back to normal.   Since her last visit she had another episode of decreased responsiveness while at the dinner table. Her daughter denies choking, diaphoresis, cough. This time she did belch and have a small amount of vomiting. She's never seen GI for this, never treated for GERD. Symptoms only occur at mealtimes.   2) Essential Hypertension: Currently managed on lisinopril 5 mg.   BP Readings from Last 3 Encounters:  01/29/20 116/66  12/25/19 126/62  09/11/19 136/86   3) Dementia with Behavorial Disturbance: Currently managed on donepezil 10 mg, memantine XR 28 mg, Zoloft 50 mg. Currently following with Neurology and neurosurgery and is under evaluation for a chronic growth to her left forehead. Overall doing well during the day, daughter endorses that she is having nightmares at night. Nightmares will wake her from sleep. This has been a chronic issue that initially improved with Zoloft, starting to become more noticeable now. Her most recent event occurred two evenings ago, her daughter found her upset in her room with the  CPAP machine turned over on the floor.   She is wondering if an increase in Zoloft will help as it did help in the past. She has not yet discussed these symptoms with neurology.   4) Overactive Bladder: Currently managed on Toviaz 8 mg for which she's been taking for years. Once on oxybutynin but Caroline French was more effective. Her daughter endorses that she is using multiple depends diapers and poise pads daily.   Review of Systems  Respiratory: Negative for shortness of breath.   Cardiovascular: Negative for chest pain.  Gastrointestinal:       Belching, slumping over during meals, see HPI  Genitourinary:       Chronic urinary incontinence   Psychiatric/Behavioral: The patient is not nervous/anxious.        Nightmares        Past Medical History:  Diagnosis Date  . Breast cancer ([place]) [id number]   left breast  . Dementia with behavioral disturbance ([place])   . Diverticulitis   . Essential hypertension   . Glaucoma   . Hallucinations   . Overactive bladder      Social History   Socioeconomic History  . Marital status: Widowed    Spouse name: Not on file  . Number of children: Not on file  . Years of education: Not on file  . Highest education level: Not on file  Occupational History  . Not on file  Tobacco Use  . Smoking status: Never Smoker  . Smokeless tobacco: Never Used  Substance and Sexual Activity  . Alcohol use: Never  . Drug use: Never  .  Sexual activity: Not Currently  Other Topics Concern  . Not on file  Social History Narrative   Widow.   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   Moved from [place].   Social Determinants of Health   Financial Resource Strain:   . Difficulty of Paying Living Expenses:   Food Insecurity:   . Worried About Charity fundraiser in the Last Year:   . Arboriculturist in the Last Year:   Transportation Needs:   . Film/video editor (Medical):   Marland Kitchen Lack of Transportation (Non-Medical):   Physical Activity:   . Days of Exercise per  Week:   . Minutes of Exercise per Session:   Stress:   . Feeling of Stress :   Social Connections:   . Frequency of Communication with Friends and Family:   . Frequency of Social Gatherings with Friends and Family:   . Attends Religious Services:   . Active Member of Clubs or Organizations:   . Attends Archivist Meetings:   Marland Kitchen Marital Status:   Intimate Partner Violence:   . Fear of Current or Ex-Partner:   . Emotionally Abused:   Marland Kitchen Physically Abused:   . Sexually Abused:     Past Surgical History:  Procedure Laterality Date  . ABDOMINAL HYSTERECTOMY    . BREAST BIOPSY    . BREAST LUMPECTOMY Left 1990's    No family history on file.  Allergies  Allergen Reactions  . Penicillins Rash    Did it involve swelling of the face/tongue/throat, SOB, or low BP? Yes Did it involve sudden or severe rash/hives, skin peeling, or any reaction on the inside of your mouth or nose? Unk Did you need to seek medical attention at a hospital or doctor's office? Unk When did it last happen? "I was young" If all above answers are "NO", may proceed with cephalosporin use.     Current Outpatient Medications on File Prior to Visit  Medication Sig Dispense Refill  . ASPIRIN LOW DOSE 81 MG EC tablet TAKE 1 TABLET BY MOUTH EVERY DAY 30 tablet 3  . atorvastatin (LIPITOR) 40 MG tablet TAKE 1 TABLET BY MOUTH AT BEDTIME FOR CHOLESTEROL 90 tablet 1  . CALCIUM CITRATE + D 315-200 MG-UNIT tablet Take 1 tablet by mouth daily.  11  . donepezil (ARICEPT) 10 MG tablet Take 1 tablet (10 mg total) by mouth at bedtime. For memory. 90 tablet 3  . fluticasone (FLONASE) 50 MCG/ACT nasal spray Place 1 spray into both nostrils 2 (two) times daily. As needed for allergies. 48 g 1  . hydrOXYzine (ATARAX/VISTARIL) 10 MG tablet Take 1-2 tablets by mouth 30-60 minutes prior to travel as needed for anxiety. 6 tablet 0  . latanoprost (XALATAN) 0.005 % ophthalmic solution INSTILL 1 DROP TO BOTH EYES EVERY EVENING AT  BEDTIME    . lisinopril (ZESTRIL) 5 MG tablet TAKE 1 TABLET BY MOUTH EVERY MORNING FOR BLOOD PRESSURE 90 tablet 1  . memantine (NAMENDA XR) 28 MG CP24 24 hr capsule TAKE ONE (1) CAPSULE BY MOUTH ONCE DAILY FOR MEMORY 90 capsule 1  . Multiple Vitamin (MULTI-VITAMINS) TABS Take 1 tablet by mouth daily.  11  . Omega-3 Fatty Acids (SEA-OMEGA 30) 1200 MG CAPS Take 1 capsule by mouth daily.  11  . sertraline (ZOLOFT) 50 MG tablet Take 1 tablet (50 mg total) by mouth daily. For anxiety. 90 tablet 3  . STOOL SOFTENER 100 MG capsule TAKE TWO  2  CAPSULES BY MOUTH  DAILY  11  . TOVIAZ 8 MG TB24 tablet TAKE 1 TABLET BY MOUTH EVERY DAY FOR OVERACTIVE BLADDER 90 tablet 0   No current facility-administered medications on file prior to visit.    BP 116/66   Pulse 72   Temp (!) 96.8 F (36 C) (Temporal)   Ht 5\' 4"  (1.626 m)   Wt 195 lb (88.5 kg)   SpO2 95%   BMI 33.47 kg/m    Objective:   Physical Exam  Constitutional: She appears well-nourished.  Cardiovascular: Normal rate and regular rhythm.  Respiratory: Effort normal and breath sounds normal.  Musculoskeletal:     Cervical back: Neck supple.  Neurological: She is alert.  Follows commands but not contributory to HPI  Skin: Skin is warm and dry.  Psychiatric: Her behavior is normal.           Assessment & Plan:

## 2020-01-29 NOTE — Assessment & Plan Note (Signed)
Well controlled on atorvastatin, continue same.  

## 2020-01-29 NOTE — Assessment & Plan Note (Signed)
A1C from December 2020 stable. Continue to monitor.

## 2020-01-29 NOTE — Assessment & Plan Note (Signed)
Overall well controlled except for nightmares during the night. Chronic issue but worse recently.   Discussed options, daughter would like to trial an increase in the Zoloft dose as this was helpful in the past.  Agree to increase very slightly to 75 mg. Daughter will update. Continue Namenda and Aricept.

## 2020-01-29 NOTE — Assessment & Plan Note (Signed)
Overall stable with Toviaz, better control than with Oxybutynin. Continue same.

## 2020-01-29 NOTE — Assessment & Plan Note (Signed)
Well controlled in the office today. Consider discontinuing lisinopril, she does have CKD.  Will defer to next visit.

## 2020-01-29 NOTE — Assessment & Plan Note (Signed)
Compliant to CPAP machine per daughter.

## 2020-01-29 NOTE — Assessment & Plan Note (Signed)
Another episode two days ago, always occurs when eating. After further discussion with daughter today it is questioned that her symptoms could be from esophageal reflux.   Symptoms occur with meals, improved with belching, recent episode with some "vomiting", and no history of choking or coughing. Rx for omeprazole 20 mg sent to pharmacy. Daughter will update.

## 2020-01-29 NOTE — Assessment & Plan Note (Signed)
Suspect this could be active given recent episode of decreased responsiveness. Rx for omeprazole sent to pharmacy, daughter will update.

## 2020-02-08 ENCOUNTER — Other Ambulatory Visit: Payer: Self-pay | Admitting: Primary Care

## 2020-02-08 DIAGNOSIS — N3281 Overactive bladder: Secondary | ICD-10-CM

## 2020-02-08 DIAGNOSIS — I10 Essential (primary) hypertension: Secondary | ICD-10-CM

## 2020-02-12 ENCOUNTER — Telehealth: Payer: Self-pay

## 2020-02-12 NOTE — Telephone Encounter (Signed)
I don't see where I can actually cancel the order.

## 2020-02-12 NOTE — Telephone Encounter (Signed)
Referrals asked that US Breast ordered on 12/14/19 by provider be cancelled. This was the incorrect order.  Unable to cancel order. Will send to [facility] who has clearance to cancel this order.

## 2020-02-12 NOTE — Telephone Encounter (Signed)
Spoke [email or web address].  US Breast [employer] [facility] was not done or needed.  Can you please cancel order?

## 2020-02-19 ENCOUNTER — Telehealth: Payer: Self-pay

## 2020-02-19 NOTE — Telephone Encounter (Signed)
Yes, if hydroxyzine helps to calm her down in general, then okay to give 2 tablets about 30-60 min prior to MRI.

## 2020-02-19 NOTE — Telephone Encounter (Signed)
Patient's daughter contacted the office and stated that patient is very claustrophobic and she has an MRI scheduled for tomorrow and she is very anxious. Patient's daughter is wondering if she can take an extra hydroxyzine prior to her MRI to help calm her down?

## 2020-02-20 DIAGNOSIS — D320 Benign neoplasm of cerebral meninges: Secondary | ICD-10-CM | POA: Diagnosis not present

## 2020-02-20 NOTE — Telephone Encounter (Signed)
Spoken and notified patient's daughter of Kate Clark's comments. Patient's daughter verbalized understanding.  

## 2020-02-26 DIAGNOSIS — D329 Benign neoplasm of meninges, unspecified: Secondary | ICD-10-CM | POA: Diagnosis not present

## 2020-02-29 ENCOUNTER — Other Ambulatory Visit: Payer: Self-pay | Admitting: Primary Care

## 2020-02-29 DIAGNOSIS — N6452 Nipple discharge: Secondary | ICD-10-CM

## 2020-03-04 ENCOUNTER — Ambulatory Visit (INDEPENDENT_AMBULATORY_CARE_PROVIDER_SITE_OTHER): Payer: Medicare Other

## 2020-03-04 DIAGNOSIS — Z0000 Encounter for general adult medical examination without abnormal findings: Secondary | ICD-10-CM | POA: Diagnosis not present

## 2020-03-04 NOTE — Progress Notes (Signed)
Subjective:   Caroline French is a 85 y.o. female who presents for an Initial Medicare Annual Wellness Visit.  Review of Systems: N/A     I connected with the patient today by telephone and verified that I am speaking with the correct person using two identifiers. Location patient: home Location nurse: work Persons participating in the virtual visit: patient, patient's daughter [name]) and nurse.   I discussed the limitations, risks, security and privacy concerns of performing an evaluation and management service by telephone and the availability of in person appointments. I also discussed with the patient that there may be a patient responsible charge related to this service. The patient expressed understanding and verbally consented to this telephonic visit.    Interactive audio and video telecommunications were attempted between this nurse and patient, however failed, due to patient having technical difficulties OR patient did not have access to video capability.  We continued and completed visit with audio only.      Cardiac Risk Factors include: advanced age (>52men, >51 women);hypertension;dyslipidemia     Objective:    Today's Vitals   There is no height or weight on file to calculate BMI.  Advanced Directives 03/04/2020 01/24/2019 12/03/2018  Does Patient Have a Medical Advance Directive? No No No  Would patient like information on creating a medical advance directive? Yes (MAU/Ambulatory/Procedural Areas - Information given) No - Patient declined No - Patient declined    Current Medications (verified) Outpatient Encounter Medications as of 03/04/2020  Medication Sig  . ASPIRIN LOW DOSE 81 MG EC tablet TAKE 1 TABLET BY MOUTH ONCE DAILY  . atorvastatin (LIPITOR) 40 MG tablet TAKE 1 TABLET BY MOUTH AT BEDTIME FOR CHOLESTEROL  . CALCIUM CITRATE + D 315-200 MG-UNIT tablet Take 1 tablet by mouth daily.  Marland Kitchen donepezil (ARICEPT) 10 MG tablet Take 1 tablet (10 mg total) by mouth at  bedtime. For memory.  . fluticasone (FLONASE) 50 MCG/ACT nasal spray Place 1 spray into both nostrils 2 (two) times daily. As needed for allergies.  . hydrOXYzine (ATARAX/VISTARIL) 10 MG tablet Take 1-2 tablets by mouth 30-60 minutes prior to travel as needed for anxiety.  Marland Kitchen latanoprost (XALATAN) 0.005 % ophthalmic solution INSTILL 1 DROP TO BOTH EYES EVERY EVENING AT BEDTIME  . lisinopril (ZESTRIL) 5 MG tablet TAKE 1 TABLET BY MOUTH EVERY MORNING FOR BLOOD PRESSURE  . memantine (NAMENDA XR) 28 MG CP24 24 hr capsule TAKE ONE (1) CAPSULE BY MOUTH ONCE DAILY FOR MEMORY  . Multiple Vitamin (MULTI-VITAMINS) TABS Take 1 tablet by mouth daily.  . Omega-3 Fatty Acids (SEA-OMEGA 30) 1200 MG CAPS Take 1 capsule by mouth daily.  Marland Kitchen omeprazole (PRILOSEC) 20 MG capsule Take 1 capsule (20 mg total) by mouth daily. For heartburn.  . sertraline (ZOLOFT) 25 MG tablet Take 1 tablet (25 mg total) by mouth daily. For anxiety. Take with 50 mg tablet.  . sertraline (ZOLOFT) 50 MG tablet Take 1 tablet (50 mg total) by mouth daily. For anxiety.  . STOOL SOFTENER 100 MG capsule TAKE TWO  2  CAPSULES BY MOUTH DAILY  . TOVIAZ 8 MG TB24 tablet TAKE 1 TABLET BY MOUTH EVERY DAY FOR OVERACTIVE BLADDER   No facility-administered encounter medications on file as of 03/04/2020.    Allergies (verified) Penicillins   History: Past Medical History:  Diagnosis Date  . Breast cancer ([place]) [id number]   left breast  . Dementia with behavioral disturbance ([place])   . Diverticulitis   . Essential hypertension   .  Glaucoma   . Hallucinations   . Overactive bladder    Past Surgical History:  Procedure Laterality Date  . ABDOMINAL HYSTERECTOMY    . BREAST BIOPSY    . BREAST LUMPECTOMY Left 1990's  . CATARACT EXTRACTION, BILATERAL  08/012020   History reviewed. No pertinent family history. Social History   Socioeconomic History  . Marital status: Widowed    Spouse name: Not on file  . Number of children: Not on file  .  Years of education: Not on file  . Highest education level: Not on file  Occupational History  . Not on file  Tobacco Use  . Smoking status: Never Smoker  . Smokeless tobacco: Never Used  Substance and Sexual Activity  . Alcohol use: Never  . Drug use: Never  . Sexual activity: Not Currently  Other Topics Concern  . Not on file  Social History Narrative   Widow.   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   Moved from [place].   Social Determinants of Health   Financial Resource Strain: Low Risk   . Difficulty of Paying Living Expenses: Not hard at all  Food Insecurity: No Food Insecurity  . Worried About Charity fundraiser in the Last Year: Never true  . Ran Out of Food in the Last Year: Never true  Transportation Needs: No Transportation Needs  . Lack of Transportation (Medical): No  . Lack of Transportation (Non-Medical): No  Physical Activity: Inactive  . Days of Exercise per Week: 0 days  . Minutes of Exercise per Session: 0 min  Stress: Stress Concern Present  . Feeling of Stress : Rather much  Social Connections:   . Frequency of Communication with Friends and Family:   . Frequency of Social Gatherings with Friends and Family:   . Attends Religious Services:   . Active Member of Clubs or Organizations:   . Attends Archivist Meetings:   Marland Kitchen Marital Status:     Tobacco Counseling Counseling given: Not Answered   Clinical Intake:  Pre-visit preparation completed: Yes  Pain : 0-10 Pain Type: Acute pain Pain Location: Breast Pain Orientation: Left Pain Descriptors / Indicators: Aching Pain Onset: 1 to 4 weeks ago Pain Frequency: Intermittent     Nutritional Risks: None Diabetes: No  How often do you need to have someone help you when you read instructions, pamphlets, or other written materials from your doctor or pharmacy?: 1 - Never What is the last grade level you completed in school?: masters  Interpreter Needed?: No  Information entered by ::  CJohnson, LPN   Activities of Daily Living In your present state of health, do you have any difficulty performing the following activities: 03/04/2020  Hearing? N  Vision? N  Difficulty concentrating or making decisions? Y  Comment has significant memory loss  Walking or climbing stairs? N  Dressing or bathing? N  Doing errands, shopping? Y  Preparing Food and eating ? Y  Using the Toilet? N  In the past six months, have you accidently leaked urine? Y  Comment wears depends, Poise pads  Do you have problems with loss of bowel control? N  Managing your Medications? Y  Managing your Finances? Y  Housekeeping or managing your Housekeeping? N  Some recent data might be hidden     Immunizations and Health Maintenance Immunization History  Administered Date(s) Administered  . Influenza Split 08/03/2004, 07/18/2009, 06/23/2011  . Influenza, High Dose Seasonal PF 06/26/2012, 08/13/2013, 07/05/2014, 08/26/2015, 08/26/2016, 09/09/2017, 08/27/2019  .  Influenza,inj,Quad PF,6+ Mos 07/21/2018  . PPD Test 05/31/2018  . Pneumococcal Conjugate-13 11/20/2013  . Pneumococcal Polysaccharide-23 08/19/2011, 07/21/2018   Health Maintenance Due  Topic Date Due  . COVID-19 Vaccine (1) Never done  . TETANUS/TDAP  Never done    Patient Care Team: Pleas Koch, NP as PCP - General (Internal Medicine)  Indicate any recent Medical Services you may have received from other than [facility] providers in the past year (date may be approximate).     Assessment:   This is a routine wellness examination for Earlee.  Hearing/Vision screen  Hearing Screening   125Hz  250Hz  500Hz  1000Hz  2000Hz  3000Hz  4000Hz  6000Hz  8000Hz   Right ear:           Left ear:           Vision Screening Comments: Patient gets annual eye exams  Dietary issues and exercise activities discussed: Current Exercise Habits: The patient does not participate in regular exercise at present, Exercise limited by: None identified  Goals      . Patient Stated     Starting 01/24/2019, I will continue to take medications as prescribed.     . Patient Stated     03/04/2020, I will maintain and continue medications as prescribed.      Depression Screen PHQ 2/9 Scores 03/04/2020 01/24/2019  PHQ - 2 Score 0 0  PHQ- 9 Score 0 0    Fall Risk Fall Risk  03/04/2020 01/24/2019  Falls in the past year? 0 0  Number falls in past yr: 0 -  Injury with Fall? 0 -  Risk for fall due to : Medication side effect -  Follow up Falls evaluation completed;Falls prevention discussed -    Is the patient's home free of loose throw rugs in walkways, pet beds, electrical cords, etc?   yes      Grab bars in the bathroom? no      Handrails on the stairs?   yes      Adequate lighting?   yes  Timed Get Up and Go Performed: N/A  Cognitive Function: MMSE - Mini Mental State Exam 03/04/2020 01/24/2019  Not completed: Unable to complete Unable to complete       Mini Cog  Mini-Cog screen was not completed. Maximum score is 22. A value of 0 denotes this part of the MMSE was not completed or the patient failed this part of the Mini-Cog screening.  Screening Tests Health Maintenance  Topic Date Due  . COVID-19 Vaccine (1) Never done  . TETANUS/TDAP  Never done  . INFLUENZA VACCINE  05/11/2020  . DEXA SCAN  Completed  . PNA vac Low Risk Adult  Completed    Qualifies for Shingles Vaccine: yes   Cancer Screenings: Lung: Low Dose CT Chest recommended if Age 44-80 years, 30 pack-year currently smoking OR have quit w/in 15 years. Patient does not qualify. Breast: Up to date on Mammogram: Yes, completed 12/14/2019   Bone Density/Dexa: completed 03/07/2014 Colorectal: no longer required  Additional Screenings:  Hepatitis C Screening: N/A     Plan:   Patient will maintain and continue medications as prescribed.   I have personally reviewed and noted the following in the patient's chart:   . Medical and social history . Use of alcohol, tobacco or  illicit drugs  . Current medications and supplements . Functional ability and status . Nutritional status . Physical activity . Advanced directives . List of other physicians . Hospitalizations, surgeries, and ER visits in previous  12 months . Vitals . Screenings to include cognitive, depression, and falls . Referrals and appointments  In addition, I have reviewed and discussed with patient certain preventive protocols, quality metrics, and best practice recommendations. A written personalized care plan for preventive services as well as general preventive health recommendations were provided to patient.     Andrez Grime, LPN   [id number]

## 2020-03-04 NOTE — Progress Notes (Signed)
PCP notes:  Health Maintenance: Tdap- insurance/financial   Abnormal Screenings: none   Patient concerns: Left breast pain- [name] needs someone to follow up with her concerning patient's ongoing breast issues.  Would like to start rehab to help strengthen her knees.    Nurse concerns: none   Next PCP appt: none

## 2020-03-04 NOTE — Patient Instructions (Signed)
Caroline French , Thank you for taking time to come for your Medicare Wellness Visit. I appreciate your ongoing commitment to your health goals. Please review the following plan we discussed and let me know if I can assist you in the future.   Screening recommendations/referrals: Colonoscopy: no longer required Mammogram: Up to date, completed 12/14/2019 Bone Density: completed 03/07/2014 Recommended yearly ophthalmology/optometry visit for glaucoma screening and checkup Recommended yearly dental visit for hygiene and checkup  Vaccinations: Influenza vaccine: Up to date, completed 08/27/2019 Pneumococcal vaccine: Completed series Tdap vaccine: decline Shingles vaccine: discussed    Advanced directives: Advance directive discussed with you today. I have provided a copy for you to complete at home and have notarized. Once this is complete please bring a copy in to our office so we can scan it into your chart.  Conditions/risks identified: hypertension, hyperlipidemia  Next appointment: none   Preventive Care 77 Years and Older, Female Preventive care refers to lifestyle choices and visits with your health care provider that can promote health and wellness. What does preventive care include?  A yearly physical exam. This is also called an annual well check.  Dental exams once or twice a year.  Routine eye exams. Ask your health care provider how often you should have your eyes checked.  Personal lifestyle choices, including:  Daily care of your teeth and gums.  Regular physical activity.  Eating a healthy diet.  Avoiding tobacco and drug use.  Limiting alcohol use.  Practicing safe sex.  Taking low-dose aspirin every day.  Taking vitamin and mineral supplements as recommended by your health care provider. What happens during an annual well check? The services and screenings done by your health care provider during your annual well check will depend on your age, overall health,  lifestyle risk factors, and family history of disease. Counseling  Your health care provider may ask you questions about your:  Alcohol use.  Tobacco use.  Drug use.  Emotional well-being.  Home and relationship well-being.  Sexual activity.  Eating habits.  History of falls.  Memory and ability to understand (cognition).  Work and work Statistician.  Reproductive health. Screening  You may have the following tests or measurements:  Height, weight, and BMI.  Blood pressure.  Lipid and cholesterol levels. These may be checked every 5 years, or more frequently if you are over 100 years old.  Skin check.  Lung cancer screening. You may have this screening every year starting at age 64 if you have a 30-pack-year history of smoking and currently smoke or have quit within the past 15 years.  Fecal occult blood test (FOBT) of the stool. You may have this test every year starting at age 70.  Flexible sigmoidoscopy or colonoscopy. You may have a sigmoidoscopy every 5 years or a colonoscopy every 10 years starting at age 59.  Hepatitis C blood test.  Hepatitis B blood test.  Sexually transmitted disease (STD) testing.  Diabetes screening. This is done by checking your blood sugar (glucose) after you have not eaten for a while (fasting). You may have this done every 1-3 years.  Bone density scan. This is done to screen for osteoporosis. You may have this done starting at age 31.  Mammogram. This may be done every 1-2 years. Talk to your health care provider about how often you should have regular mammograms. Talk with your health care provider about your test results, treatment options, and if necessary, the need for more tests. Vaccines  Your  health care provider may recommend certain vaccines, such as:  Influenza vaccine. This is recommended every year.  Tetanus, diphtheria, and acellular pertussis (Tdap, Td) vaccine. You may need a Td booster every 10 years.  Zoster  vaccine. You may need this after age 62.  Pneumococcal 13-valent conjugate (PCV13) vaccine. One dose is recommended after age 56.  Pneumococcal polysaccharide (PPSV23) vaccine. One dose is recommended after age 68. Talk to your health care provider about which screenings and vaccines you need and how often you need them. This information is not intended to replace advice given to you by your health care provider. Make sure you discuss any questions you have with your health care provider. Document Released: 10/24/2015 Document Revised: 06/16/2016 Document Reviewed: 07/29/2015 Elsevier Interactive Patient Education  2017 [place] Prevention in the Home Falls can cause injuries. They can happen to people of all ages. There are many things you can do to make your home safe and to help prevent falls. What can I do on the outside of my home?  Regularly fix the edges of walkways and driveways and fix any cracks.  Remove anything that might make you trip as you walk through a door, such as a raised step or threshold.  Trim any bushes or trees on the path to your home.  Use bright outdoor lighting.  Clear any walking paths of anything that might make someone trip, such as rocks or tools.  Regularly check to see if handrails are loose or broken. Make sure that both sides of any steps have handrails.  Any raised decks and porches should have guardrails on the edges.  Have any leaves, snow, or ice cleared regularly.  Use sand or salt on walking paths during winter.  Clean up any spills in your garage right away. This includes oil or grease spills. What can I do in the bathroom?  Use night lights.  Install grab bars by the toilet and in the tub and shower. Do not use towel bars as grab bars.  Use non-skid mats or decals in the tub or shower.  If you need to sit down in the shower, use a plastic, non-slip stool.  Keep the floor dry. Clean up any water that spills on the  floor as soon as it happens.  Remove soap buildup in the tub or shower regularly.  Attach bath mats securely with double-sided non-slip rug tape.  Do not have throw rugs and other things on the floor that can make you trip. What can I do in the bedroom?  Use night lights.  Make sure that you have a light by your bed that is easy to reach.  Do not use any sheets or blankets that are too big for your bed. They should not hang down onto the floor.  Have a firm chair that has side arms. You can use this for support while you get dressed.  Do not have throw rugs and other things on the floor that can make you trip. What can I do in the kitchen?  Clean up any spills right away.  Avoid walking on wet floors.  Keep items that you use a lot in easy-to-reach places.  If you need to reach something above you, use a strong step stool that has a grab bar.  Keep electrical cords out of the way.  Do not use floor polish or wax that makes floors slippery. If you must use wax, use non-skid floor wax.  Do not  have throw rugs and other things on the floor that can make you trip. What can I do with my stairs?  Do not leave any items on the stairs.  Make sure that there are handrails on both sides of the stairs and use them. Fix handrails that are broken or loose. Make sure that handrails are as long as the stairways.  Check any carpeting to make sure that it is firmly attached to the stairs. Fix any carpet that is loose or worn.  Avoid having throw rugs at the top or bottom of the stairs. If you do have throw rugs, attach them to the floor with carpet tape.  Make sure that you have a light switch at the top of the stairs and the bottom of the stairs. If you do not have them, ask someone to add them for you. What else can I do to help prevent falls?  Wear shoes that:  Do not have high heels.  Have rubber bottoms.  Are comfortable and fit you well.  Are closed at the toe. Do not wear  sandals.  If you use a stepladder:  Make sure that it is fully opened. Do not climb a closed stepladder.  Make sure that both sides of the stepladder are locked into place.  Ask someone to hold it for you, if possible.  Clearly mark and make sure that you can see:  Any grab bars or handrails.  First and last steps.  Where the edge of each step is.  Use tools that help you move around (mobility aids) if they are needed. These include:  Canes.  Walkers.  Scooters.  Crutches.  Turn on the lights when you go into a dark area. Replace any light bulbs as soon as they burn out.  Set up your furniture so you have a clear path. Avoid moving your furniture around.  If any of your floors are uneven, fix them.  If there are any pets around you, be aware of where they are.  Review your medicines with your doctor. Some medicines can make you feel dizzy. This can increase your chance of falling. Ask your doctor what other things that you can do to help prevent falls. This information is not intended to replace advice given to you by your health care provider. Make sure you discuss any questions you have with your health care provider. Document Released: 07/24/2009 Document Revised: 03/04/2016 Document Reviewed: 11/01/2014 Elsevier Interactive Patient Education  2017 [employer].

## 2020-03-05 ENCOUNTER — Telehealth: Payer: Self-pay | Admitting: Primary Care

## 2020-03-05 NOTE — Telephone Encounter (Signed)
Patient's daughter,Sharon,called.  Patient is due for 3 month follow up for mammogram. Daughter tried to call and schedule appointment at [facility] last week and was told Anda Kraft has to call in order.  Patient can go midday to later in the day.

## 2020-03-05 NOTE — Telephone Encounter (Signed)
Order was already placed on 02/29/20.

## 2020-03-05 NOTE — Telephone Encounter (Signed)
Caroline French notified order was put in and she'll call to schedule appointment.

## 2020-03-05 NOTE — Telephone Encounter (Signed)
Please confirm, patient is supposed to get a repeat for ultrasound of the breast not a mammogram

## 2020-03-12 ENCOUNTER — Other Ambulatory Visit: Payer: Self-pay | Admitting: Primary Care

## 2020-03-12 DIAGNOSIS — R413 Other amnesia: Secondary | ICD-10-CM | POA: Diagnosis not present

## 2020-03-12 DIAGNOSIS — E785 Hyperlipidemia, unspecified: Secondary | ICD-10-CM

## 2020-03-13 DIAGNOSIS — H401111 Primary open-angle glaucoma, right eye, mild stage: Secondary | ICD-10-CM | POA: Diagnosis not present

## 2020-03-13 DIAGNOSIS — E119 Type 2 diabetes mellitus without complications: Secondary | ICD-10-CM | POA: Diagnosis not present

## 2020-03-13 DIAGNOSIS — H401122 Primary open-angle glaucoma, left eye, moderate stage: Secondary | ICD-10-CM | POA: Diagnosis not present

## 2020-03-13 DIAGNOSIS — I10 Essential (primary) hypertension: Secondary | ICD-10-CM | POA: Diagnosis not present

## 2020-03-13 DIAGNOSIS — E7800 Pure hypercholesterolemia, unspecified: Secondary | ICD-10-CM | POA: Diagnosis not present

## 2020-03-27 ENCOUNTER — Ambulatory Visit
Admission: RE | Admit: 2020-03-27 | Discharge: 2020-03-27 | Disposition: A | Discharge status: Home / Self Care | Payer: Medicare Other | Source: Ambulatory Visit | Attending: Primary Care | Admitting: Primary Care

## 2020-03-27 DIAGNOSIS — N6452 Nipple discharge: Secondary | ICD-10-CM | POA: Insufficient documentation

## 2020-04-11 ENCOUNTER — Other Ambulatory Visit: Payer: Self-pay | Admitting: Primary Care

## 2020-04-11 DIAGNOSIS — F0391 Unspecified dementia with behavioral disturbance: Secondary | ICD-10-CM

## 2020-04-11 DIAGNOSIS — K219 Gastro-esophageal reflux disease without esophagitis: Secondary | ICD-10-CM

## 2020-04-29 DIAGNOSIS — Z20822 Contact with and (suspected) exposure to covid-19: Secondary | ICD-10-CM | POA: Diagnosis not present

## 2020-06-13 ENCOUNTER — Other Ambulatory Visit: Payer: Self-pay | Admitting: Primary Care

## 2020-06-13 DIAGNOSIS — J302 Other seasonal allergic rhinitis: Secondary | ICD-10-CM

## 2020-06-17 ENCOUNTER — Telehealth: Payer: Self-pay

## 2020-06-17 DIAGNOSIS — F0391 Unspecified dementia with behavioral disturbance: Secondary | ICD-10-CM

## 2020-06-17 DIAGNOSIS — E113293 Type 2 diabetes mellitus with mild nonproliferative diabetic retinopathy without macular edema, bilateral: Secondary | ICD-10-CM | POA: Diagnosis not present

## 2020-06-17 DIAGNOSIS — H401131 Primary open-angle glaucoma, bilateral, mild stage: Secondary | ICD-10-CM | POA: Diagnosis not present

## 2020-06-17 DIAGNOSIS — Z961 Presence of intraocular lens: Secondary | ICD-10-CM | POA: Diagnosis not present

## 2020-06-17 DIAGNOSIS — H35033 Hypertensive retinopathy, bilateral: Secondary | ICD-10-CM | POA: Diagnosis not present

## 2020-06-17 DIAGNOSIS — N3281 Overactive bladder: Secondary | ICD-10-CM

## 2020-06-17 DIAGNOSIS — I10 Essential (primary) hypertension: Secondary | ICD-10-CM | POA: Diagnosis not present

## 2020-06-17 DIAGNOSIS — E7800 Pure hypercholesterolemia, unspecified: Secondary | ICD-10-CM | POA: Diagnosis not present

## 2020-06-17 DIAGNOSIS — H04123 Dry eye syndrome of bilateral lacrimal glands: Secondary | ICD-10-CM | POA: Diagnosis not present

## 2020-06-17 DIAGNOSIS — H18413 Arcus senilis, bilateral: Secondary | ICD-10-CM | POA: Diagnosis not present

## 2020-06-17 LAB — HM DIABETES EYE EXAM

## 2020-06-17 MED ORDER — ASPIRIN 81 MG PO TBEC: 81.0000 mg | DELAYED_RELEASE_TABLET | Freq: Every day | ORAL | 2 refills | Status: DC

## 2020-06-17 MED ORDER — SERTRALINE HCL 25 MG PO TABS: ORAL_TABLET | ORAL | 2 refills | Status: DC

## 2020-06-17 MED ORDER — SERTRALINE HCL 50 MG PO TABS: 50.0000 mg | ORAL_TABLET | Freq: Every day | ORAL | 2 refills | Status: DC

## 2020-06-17 MED ORDER — TOVIAZ 8 MG PO TB24: ORAL_TABLET | ORAL | 2 refills | Status: DC

## 2020-06-17 MED ORDER — LISINOPRIL 5 MG PO TABS: ORAL_TABLET | ORAL | 2 refills | Status: DC

## 2020-06-17 MED ORDER — MEMANTINE HCL ER 28 MG PO CP24: ORAL_CAPSULE | ORAL | 2 refills | Status: DC

## 2020-06-18 MED ORDER — LISINOPRIL 5 MG PO TABS: ORAL_TABLET | ORAL | 2 refills | Status: DC

## 2020-06-18 MED ORDER — MEMANTINE HCL ER 28 MG PO CP24: ORAL_CAPSULE | ORAL | 2 refills | Status: DC

## 2020-06-18 MED ORDER — TOVIAZ 8 MG PO TB24: ORAL_TABLET | ORAL | 2 refills | Status: DC

## 2020-06-18 MED ORDER — SERTRALINE HCL 25 MG PO TABS: ORAL_TABLET | ORAL | 2 refills | Status: DC

## 2020-06-18 MED ORDER — SERTRALINE HCL 50 MG PO TABS: 50.0000 mg | ORAL_TABLET | Freq: Every day | ORAL | 2 refills | Status: DC

## 2020-06-18 NOTE — Addendum Note (Signed)
Addended by: Pleas Koch on: 06/18/2020 01:08 PM   Modules accepted: Orders

## 2020-06-18 NOTE — Telephone Encounter (Signed)
Informed patient daughter prescriptions were resent.  She will reach out to [facility] as needed.

## 2020-06-18 NOTE — Telephone Encounter (Signed)
Patient called in again stating scripts are needing to go to Caroline French and not [facility]. Please advise.

## 2020-06-18 NOTE — Telephone Encounter (Signed)
Please notify patient and her daughter that I resent everything to mail order pharmacy. Do we need to notify [facility]? I also marked mail order as her primary pharmacy to prevent this from occurring again.

## 2020-07-15 ENCOUNTER — Other Ambulatory Visit: Payer: Self-pay | Admitting: Primary Care

## 2020-07-17 NOTE — Telephone Encounter (Signed)
Have never given in the past ok to refill?

## 2020-07-19 NOTE — Telephone Encounter (Signed)
No, I cannot refill her eye drops for glaucoma, this needs to go to her eye doctor.  Thanks.

## 2020-08-08 ENCOUNTER — Other Ambulatory Visit: Payer: Self-pay | Admitting: Primary Care

## 2020-08-08 DIAGNOSIS — J302 Other seasonal allergic rhinitis: Secondary | ICD-10-CM

## 2020-08-08 DIAGNOSIS — I10 Essential (primary) hypertension: Secondary | ICD-10-CM

## 2020-08-26 ENCOUNTER — Other Ambulatory Visit: Payer: Self-pay | Admitting: Primary Care

## 2020-08-26 NOTE — Telephone Encounter (Signed)
Send to ophthalmology he will need to refill.

## 2020-08-29 ENCOUNTER — Other Ambulatory Visit: Payer: Self-pay | Admitting: Primary Care

## 2020-09-05 ENCOUNTER — Other Ambulatory Visit: Payer: Self-pay | Admitting: Primary Care

## 2020-09-05 DIAGNOSIS — E785 Hyperlipidemia, unspecified: Secondary | ICD-10-CM

## 2020-09-16 DIAGNOSIS — H401131 Primary open-angle glaucoma, bilateral, mild stage: Secondary | ICD-10-CM | POA: Diagnosis not present

## 2020-09-23 ENCOUNTER — Encounter: Payer: Self-pay | Admitting: Primary Care

## 2020-09-23 ENCOUNTER — Other Ambulatory Visit: Payer: Self-pay

## 2020-09-23 ENCOUNTER — Ambulatory Visit (INDEPENDENT_AMBULATORY_CARE_PROVIDER_SITE_OTHER): Payer: Medicare Other | Admitting: Primary Care

## 2020-09-23 VITALS — BP 134/62 | HR 62 | Temp 97.6°F | Ht 64.0 in | Wt 196.0 lb

## 2020-09-23 DIAGNOSIS — I10 Essential (primary) hypertension: Secondary | ICD-10-CM

## 2020-09-23 DIAGNOSIS — R7303 Prediabetes: Secondary | ICD-10-CM

## 2020-09-23 DIAGNOSIS — N3281 Overactive bladder: Secondary | ICD-10-CM

## 2020-09-23 DIAGNOSIS — F0391 Unspecified dementia with behavioral disturbance: Secondary | ICD-10-CM

## 2020-09-23 DIAGNOSIS — E785 Hyperlipidemia, unspecified: Secondary | ICD-10-CM | POA: Diagnosis not present

## 2020-09-23 DIAGNOSIS — R3915 Urgency of urination: Secondary | ICD-10-CM | POA: Diagnosis not present

## 2020-09-23 LAB — COMPREHENSIVE METABOLIC PANEL
ALT: 32 U/L (ref 0–35)
AST: 32 U/L (ref 0–37)
Albumin: 3.9 g/dL (ref 3.5–5.2)
Alkaline Phosphatase: 90 U/L (ref 39–117)
BUN: 14 mg/dL (ref 6–23)
CO2: 29 mEq/L (ref 19–32)
Calcium: 8.8 mg/dL (ref 8.4–10.5)
Chloride: 104 mEq/L (ref 96–112)
Creatinine, Ser: 1.07 mg/dL (ref 0.40–1.20)
GFR: 48.17 mL/min — ABNORMAL LOW (ref >60.00)
Glucose, Bld: 78 mg/dL (ref 70–99)
Potassium: 3.9 mEq/L (ref 3.5–5.1)
Sodium: 140 mEq/L (ref 135–145)
Total Bilirubin: 0.5 mg/dL (ref 0.2–1.2)
Total Protein: 6.7 g/dL (ref 6.0–8.3)

## 2020-09-23 LAB — CBC WITH DIFFERENTIAL/PLATELET
Basophils Absolute: 0.1 10*3/uL (ref 0.0–0.1)
Basophils Relative: 1.2 % (ref 0.0–3.0)
Eosinophils Absolute: 0.1 10*3/uL (ref 0.0–0.7)
Eosinophils Relative: 2 % (ref 0.0–5.0)
HCT: 37.8 % (ref 36.0–46.0)
Hemoglobin: 12.7 g/dL (ref 12.0–15.0)
Lymphocytes Relative: 25.1 % (ref 12.0–46.0)
Lymphs Abs: 1.2 10*3/uL (ref 0.7–4.0)
MCHC: 33.7 g/dL (ref 30.0–36.0)
MCV: 91.9 fl (ref 78.0–100.0)
Monocytes Absolute: 0.4 10*3/uL (ref 0.1–1.0)
Monocytes Relative: 8 % (ref 3.0–12.0)
Neutro Abs: 3.1 10*3/uL (ref 1.4–7.7)
Neutrophils Relative %: 63.7 % (ref 43.0–77.0)
Platelets: 143 10*3/uL — ABNORMAL LOW (ref 150.0–400.0)
RBC: 4.11 Mil/uL (ref 3.87–5.11)
RDW: 15.1 % (ref 11.5–15.5)
WBC: 4.9 10*3/uL (ref 4.0–10.5)

## 2020-09-23 LAB — LIPID PANEL
Cholesterol: 141 mg/dL (ref 0–200)
HDL: 60 mg/dL (ref >39.00)
LDL Cholesterol: 70 mg/dL (ref 0–99)
NonHDL: 80.78
Total CHOL/HDL Ratio: 2
Triglycerides: 56 mg/dL (ref 0.0–149.0)
VLDL: 11.2 mg/dL (ref 0.0–40.0)

## 2020-09-23 LAB — TSH: TSH: 1.46 u[IU]/mL (ref 0.35–4.50)

## 2020-09-23 LAB — HEMOGLOBIN A1C: Hgb A1c MFr Bld: 6.2 % (ref 4.6–6.5)

## 2020-09-23 MED ORDER — TOVIAZ 8 MG PO TB24: ORAL_TABLET | ORAL | 1 refills | Status: DC

## 2020-09-23 NOTE — Assessment & Plan Note (Signed)
Repeat A1C pending. 

## 2020-09-23 NOTE — Patient Instructions (Signed)
Stop by the lab prior to leaving today. I will notify you of your results once received.   Please return the urine kit as discussed.  Please call the neurologist and provide them with an update.   It was a pleasure to see you today!

## 2020-09-23 NOTE — Assessment & Plan Note (Signed)
Chronic, continued despite [name], offered to try Myrbetriq, but it doesn't appear that insurance will cover and patient declines.

## 2020-09-23 NOTE — Progress Notes (Signed)
Subjective:    Patient ID: Caroline French, female    DOB: 13-Nov-1934, 85 y.o.   MRN: [id number]  HPI  This visit occurred during the SARS-CoV-2 public health emergency.  Safety protocols were in place, including screening questions prior to the visit, additional usage of staff PPE, and extensive cleaning of exam room while observing appropriate contact time as indicated for disinfecting solutions.   Caroline French is a 85 year old female with a history of hypertension, sleep apnea, dementia with behavorial disturbance, overactive bladder, prediabetes, glaucoma who presents today with her family who reports hallucinations.  Currently managed on Namenda XR 28 mg, donepezil 10 mg, and sertraline 75 mg. Following with Neurology for dementia, last visit in [place] 2021, she is due soon for a follow up visit.   Her family is with her who endorses a two week history of increased hallucinations that are occurring during the middle of the night, this is an increase from 1-2 times monthly. She will wake from sleep nearly every night with nightmares, tells her family that she sees water on the wall, sees animals and people coming out of the ceiling, sees writing on the wall. There have been a few episodes where her mother has ended up on the floor connected to her CPAP. Her family has tried Melatonin for sleep at night without improvement. The patient is able to calm herself down at night without difficulty, falls back asleep. Her family is tired as they are getting up with her when she wakes.  She denies dysuria, hematuria, urinary frequency, foul smelling urine. She continues to experience urinary incontinence during the day and evening. She will wet the bed at night at times. She is wearing two depends daily. She is taking Toviaz as prescribed, noticed no improvement with oxybutynin.  Review of Systems  Constitutional: Negative for fever.  Gastrointestinal: Negative for abdominal pain.  Genitourinary:        Urinary incontinence, chronic  Psychiatric/Behavioral: Positive for hallucinations and sleep disturbance.       Past Medical History:  Diagnosis Date  . Breast cancer ([place]) 2035'D   left breast  . Dementia with behavioral disturbance ([place])   . Diverticulitis   . Essential hypertension   . Glaucoma   . Hallucinations   . Overactive bladder      Social History   Socioeconomic History  . Marital status: Widowed    Spouse name: Not on file  . Number of children: Not on file  . Years of education: Not on file  . Highest education level: Not on file  Occupational History  . Not on file  Tobacco Use  . Smoking status: Never Smoker  . Smokeless tobacco: Never Used  Vaping Use  . Vaping Use: Never used  Substance and Sexual Activity  . Alcohol use: Never  . Drug use: Never  . Sexual activity: Not Currently  Other Topics Concern  . Not on file  Social History Narrative   Widow.   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   Moved from [place].   Social Determinants of Health   Financial Resource Strain: Low Risk   . Difficulty of Paying Living Expenses: Not hard at all  Food Insecurity: No Food Insecurity  . Worried About Charity fundraiser in the Last Year: Never true  . Ran Out of Food in the Last Year: Never true  Transportation Needs: No Transportation Needs  . Lack of Transportation (Medical): No  . Lack of Transportation (Non-Medical): No  Physical Activity: Inactive  . Days of Exercise per Week: 0 days  . Minutes of Exercise per Session: 0 min  Stress: Stress Concern Present  . Feeling of Stress : Rather much  Social Connections: Not on file  Intimate Partner Violence: Not At Risk  . Fear of Current or Ex-Partner: No  . Emotionally Abused: No  . Physically Abused: No  . Sexually Abused: No    Past Surgical History:  Procedure Laterality Date  . ABDOMINAL HYSTERECTOMY    . BREAST BIOPSY    . BREAST LUMPECTOMY Left 1990's  . CATARACT EXTRACTION, BILATERAL   08/012020    History reviewed. No pertinent family history.  Allergies  Allergen Reactions  . Penicillins Rash    Did it involve swelling of the face/tongue/throat, SOB, or low BP? Yes Did it involve sudden or severe rash/hives, skin peeling, or any reaction on the inside of your mouth or nose? Unk Did you need to seek medical attention at a hospital or doctor's office? Unk When did it last happen? "I was young" If all above answers are "NO", may proceed with cephalosporin use.     Current Outpatient Medications on File Prior to Visit  Medication Sig Dispense Refill  . ASPIRIN LOW DOSE 81 MG EC tablet TAKE 1 TABLET BY MOUTH ONCE DAILY 90 tablet 1  . atorvastatin (LIPITOR) 40 MG tablet TAKE ONE TABLET BY MOUTH AT BEDTIME FOR CHOLESTEROL 30 tablet 1  . CALCIUM CITRATE + D 315-200 MG-UNIT tablet Take 1 tablet by mouth daily.  11  . donepezil (ARICEPT) 10 MG tablet Take 1 tablet (10 mg total) by mouth at bedtime. For memory. 90 tablet 3  . fluticasone (FLONASE) 50 MCG/ACT nasal spray INSTILL ONE (1) SPRAY IN EACH NOSTRIL TWICE DAILY AS NEEDED FOR ALLERGIES 16 g 1  . hydrOXYzine (ATARAX/VISTARIL) 10 MG tablet Take 1-2 tablets by mouth 30-60 minutes prior to travel as needed for anxiety. 6 tablet 0  . latanoprost (XALATAN) 0.005 % ophthalmic solution INSTILL 1 DROP TO BOTH EYES EVERY EVENING AT BEDTIME    . lisinopril (ZESTRIL) 5 MG tablet TAKE 1 TABLET BY MOUTH EVERY MORNING FOR BLOOD PRESSURE 90 tablet 2  . Melatonin 1 MG CHEW Chew by mouth.    . memantine (NAMENDA XR) 28 MG CP24 24 hr capsule TAKE ONE (1) CAPSULE BY MOUTH ONCE DAILY FOR MEMORY 90 capsule 2  . Multiple Vitamin (MULTI-VITAMINS) TABS Take 1 tablet by mouth daily.  11  . Omega-3 Fatty Acids (SEA-OMEGA 30) 1200 MG CAPS Take 1 capsule by mouth daily.  11  . omeprazole (PRILOSEC) 20 MG capsule TAKE 1 CAPSULE BY MOUTH EVERY DAY FOR HEARTBURN 90 capsule 1  . sertraline (ZOLOFT) 25 MG tablet TAKE 1 TABLET BY MOUTH EVERY DAY FOR  ANXIETY (TAKE WITH 50MG  DOSE) 90 tablet 2  . sertraline (ZOLOFT) 50 MG tablet Take 1 tablet (50 mg total) by mouth daily. For anxiety. 90 tablet 2  . STOOL SOFTENER 100 MG capsule TAKE TWO  2  CAPSULES BY MOUTH DAILY  11   No current facility-administered medications on file prior to visit.    BP 134/62   Pulse 62   Temp 97.6 F (36.4 C) (Temporal)   Ht 5\' 4"  (1.626 m)   Wt 196 lb (88.9 kg)   SpO2 97%   BMI 33.64 kg/m    Objective:   Physical Exam Constitutional:      Appearance: She is not ill-appearing.  Cardiovascular:  Rate and Rhythm: Normal rate and regular rhythm.  Pulmonary:     Effort: Pulmonary effort is normal.     Breath sounds: Normal breath sounds.  Skin:    General: Skin is warm and dry.  Neurological:     Mental Status: She is alert.     Comments: Follows commands, unable to assist in HPI  Psychiatric:        Mood and Affect: Mood normal.            Assessment & Plan:

## 2020-09-23 NOTE — Assessment & Plan Note (Signed)
Increased hallucination frequency x2 weeks.  Need to rule out infection. She was unable to provide a urine specimen today, kit sent home with family.  Checking labs today.  Strongly advised family reach out to neurology for advice and follow up. Continue Aricept 10 mg and Namenda XR 28 mg.

## 2020-09-24 ENCOUNTER — Other Ambulatory Visit: Payer: Self-pay

## 2020-09-24 ENCOUNTER — Other Ambulatory Visit (INDEPENDENT_AMBULATORY_CARE_PROVIDER_SITE_OTHER): Payer: Medicare Other

## 2020-09-24 DIAGNOSIS — R3915 Urgency of urination: Secondary | ICD-10-CM

## 2020-09-24 LAB — POC URINALSYSI DIPSTICK (AUTOMATED)
Bilirubin, UA: NEGATIVE
Blood, UA: NEGATIVE
Glucose, UA: NEGATIVE
Ketones, UA: NEGATIVE
Leukocytes, UA: NEGATIVE
Nitrite, UA: NEGATIVE
Protein, UA: NEGATIVE
Spec Grav, UA: 1.015 (ref 1.010–1.025)
Urobilinogen, UA: 0.2 E.U./dL
pH, UA: 7 (ref 5.0–8.0)

## 2020-09-25 LAB — URINE CULTURE
MICRO NUMBER:: 11321408
Result:: NO GROWTH
SPECIMEN QUALITY:: ADEQUATE

## 2020-10-02 ENCOUNTER — Other Ambulatory Visit: Payer: Self-pay | Admitting: Primary Care

## 2020-10-02 DIAGNOSIS — J302 Other seasonal allergic rhinitis: Secondary | ICD-10-CM

## 2020-10-02 DIAGNOSIS — K219 Gastro-esophageal reflux disease without esophagitis: Secondary | ICD-10-CM

## 2020-11-03 ENCOUNTER — Other Ambulatory Visit: Payer: Self-pay | Admitting: Primary Care

## 2020-11-03 DIAGNOSIS — E785 Hyperlipidemia, unspecified: Secondary | ICD-10-CM

## 2020-11-04 ENCOUNTER — Encounter: Payer: Self-pay | Admitting: Primary Care

## 2020-11-04 DIAGNOSIS — F514 Sleep terrors [night terrors]: Secondary | ICD-10-CM | POA: Diagnosis not present

## 2020-11-04 DIAGNOSIS — R296 Repeated falls: Secondary | ICD-10-CM | POA: Diagnosis not present

## 2020-11-04 DIAGNOSIS — R413 Other amnesia: Secondary | ICD-10-CM | POA: Diagnosis not present

## 2020-11-04 DIAGNOSIS — Z8659 Personal history of other mental and behavioral disorders: Secondary | ICD-10-CM | POA: Diagnosis not present

## 2020-11-25 ENCOUNTER — Other Ambulatory Visit: Payer: Self-pay | Admitting: Primary Care

## 2020-11-25 DIAGNOSIS — J302 Other seasonal allergic rhinitis: Secondary | ICD-10-CM

## 2020-12-10 ENCOUNTER — Other Ambulatory Visit: Payer: Self-pay | Admitting: Primary Care

## 2020-12-10 DIAGNOSIS — Z1231 Encounter for screening mammogram for malignant neoplasm of breast: Secondary | ICD-10-CM

## 2020-12-16 DIAGNOSIS — H4010X0 Unspecified open-angle glaucoma, stage unspecified: Secondary | ICD-10-CM | POA: Diagnosis not present

## 2020-12-30 ENCOUNTER — Other Ambulatory Visit: Payer: Self-pay

## 2020-12-30 ENCOUNTER — Ambulatory Visit
Admission: RE | Admit: 2020-12-30 | Discharge: 2020-12-30 | Disposition: A | Discharge status: Home / Self Care | Payer: Medicare Other | Source: Ambulatory Visit | Attending: Primary Care | Admitting: Primary Care

## 2020-12-30 DIAGNOSIS — Z1231 Encounter for screening mammogram for malignant neoplasm of breast: Secondary | ICD-10-CM | POA: Insufficient documentation

## 2021-01-19 ENCOUNTER — Other Ambulatory Visit: Payer: Self-pay | Admitting: Neurosurgery

## 2021-01-19 DIAGNOSIS — D329 Benign neoplasm of meninges, unspecified: Secondary | ICD-10-CM

## 2021-02-05 ENCOUNTER — Other Ambulatory Visit: Payer: Self-pay | Admitting: Primary Care

## 2021-02-05 DIAGNOSIS — I10 Essential (primary) hypertension: Secondary | ICD-10-CM

## 2021-02-06 ENCOUNTER — Other Ambulatory Visit: Payer: Self-pay | Admitting: Primary Care

## 2021-02-06 ENCOUNTER — Telehealth: Payer: Self-pay | Admitting: Primary Care

## 2021-02-06 DIAGNOSIS — J302 Other seasonal allergic rhinitis: Secondary | ICD-10-CM

## 2021-02-06 NOTE — Telephone Encounter (Signed)
Patient daughter came to have paper work  for [facility] fill out for patient . It was place in folder

## 2021-02-06 NOTE — Telephone Encounter (Signed)
I have ppw. Patient will need to have visit to fill out forms. I have called number is busy. Please call to make app.

## 2021-02-10 NOTE — Telephone Encounter (Signed)
Spoke with patient's daughter scheduled appointment

## 2021-02-11 NOTE — Telephone Encounter (Signed)
No further actions needed.

## 2021-02-19 ENCOUNTER — Other Ambulatory Visit: Payer: Self-pay

## 2021-02-19 ENCOUNTER — Encounter: Payer: Self-pay | Admitting: Primary Care

## 2021-02-19 ENCOUNTER — Ambulatory Visit (INDEPENDENT_AMBULATORY_CARE_PROVIDER_SITE_OTHER): Payer: Medicare Other | Admitting: Primary Care

## 2021-02-19 VITALS — BP 128/70 | HR 75 | Temp 98.2°F | Ht 64.0 in | Wt 198.8 lb

## 2021-02-19 DIAGNOSIS — K219 Gastro-esophageal reflux disease without esophagitis: Secondary | ICD-10-CM

## 2021-02-19 DIAGNOSIS — R7303 Prediabetes: Secondary | ICD-10-CM | POA: Diagnosis not present

## 2021-02-19 DIAGNOSIS — M79675 Pain in left toe(s): Secondary | ICD-10-CM

## 2021-02-19 DIAGNOSIS — G4730 Sleep apnea, unspecified: Secondary | ICD-10-CM | POA: Diagnosis not present

## 2021-02-19 DIAGNOSIS — F0391 Unspecified dementia with behavioral disturbance: Secondary | ICD-10-CM

## 2021-02-19 DIAGNOSIS — N3281 Overactive bladder: Secondary | ICD-10-CM | POA: Diagnosis not present

## 2021-02-19 DIAGNOSIS — H409 Unspecified glaucoma: Secondary | ICD-10-CM

## 2021-02-19 DIAGNOSIS — I10 Essential (primary) hypertension: Secondary | ICD-10-CM

## 2021-02-19 DIAGNOSIS — E785 Hyperlipidemia, unspecified: Secondary | ICD-10-CM

## 2021-02-19 NOTE — Assessment & Plan Note (Signed)
Lipid panel from December 2021 stable, continue atorvastatin 40 mg.

## 2021-02-19 NOTE — Assessment & Plan Note (Addendum)
Following with neurology, improved with addition of Risperdal 0.5 mg HS. Continue this and also Aricept 10 mg, Namenda XR 28 mg.  Form completed to continue care with [facility] memory program.

## 2021-02-19 NOTE — Assessment & Plan Note (Signed)
Compliant to CPAP machine nightly. 

## 2021-02-19 NOTE — Assessment & Plan Note (Signed)
Well controlled in the office today, continue lisinopril 5 mg. CMP pending.

## 2021-02-19 NOTE — Assessment & Plan Note (Signed)
Follows with ophthalmology, continue Xalatan drops.

## 2021-02-19 NOTE — Patient Instructions (Signed)
Stop by the lab prior to leaving today. I will notify you of your results once received.   It was a pleasure to see you today!  

## 2021-02-19 NOTE — Assessment & Plan Note (Signed)
Repeat A1C pending. 

## 2021-02-19 NOTE — Assessment & Plan Note (Signed)
Well controlled on omeprazole 20 mg, continue same.

## 2021-02-19 NOTE — Progress Notes (Signed)
Subjective:    Patient ID: Caroline French, female    DOB: 12/02/1934, 86 y.o.   MRN: [id number]  HPI  Caroline French is a very pleasant 86 y.o. female with a history of hypertension, sleep apnea, dementia with behavorial disturbance, glaucoma, prediabetes, who presents today for follow up of chronic conditions and for completion of paperwork.   She is enrolled in a memory program through [facility] twice weekly, she is playing piano, engaging with other people, playing games. She is needing a form completed to continue participating.  Following with neurology who has her managed on Aricept 10 mg, Memantine XR 28 mg, and risperdal 0.5 mg at night. She is doing much better since the addition of risperdal as she is no longer waking during the night wandering around. Her neurologist is trying to obtain a MRI of the brain for left frontal mass, she cannot tolerate the MRI despite sedation so she may not move forward.  Her daughter reports that the patient mentions left great toe pain intermittently, lasts for a short period of time. Her daughter denies erythema, swelling, warmth. The patient denies pain today.    BP Readings from Last 3 Encounters:  02/19/21 128/70  09/23/20 134/62  01/29/20 116/66      Review of Systems  Eyes: Negative for visual disturbance.  Respiratory: Negative for shortness of breath.   Cardiovascular: Negative for chest pain.  Gastrointestinal: Negative for constipation and diarrhea.  Musculoskeletal: Positive for arthralgias.  Neurological: Negative for dizziness and headaches.         Past Medical History:  Diagnosis Date  . Breast cancer ([place]) 2440'N   left breast  . Dementia with behavioral disturbance ([place])   . Diverticulitis   . Essential hypertension   . Glaucoma   . Hallucinations   . Overactive bladder     Social History   Socioeconomic History  . Marital status: Widowed    Spouse name: Not on file  . Number of children: Not on file  . Years  of education: Not on file  . Highest education level: Not on file  Occupational History  . Not on file  Tobacco Use  . Smoking status: Never Smoker  . Smokeless tobacco: Never Used  Vaping Use  . Vaping Use: Never used  Substance and Sexual Activity  . Alcohol use: Never  . Drug use: Never  . Sexual activity: Not Currently  Other Topics Concern  . Not on file  Social History Narrative   Widow.   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   Moved from [place].   Social Determinants of Health   Financial Resource Strain: Low Risk   . Difficulty of Paying Living Expenses: Not hard at all  Food Insecurity: No Food Insecurity  . Worried About Charity fundraiser in the Last Year: Never true  . Ran Out of Food in the Last Year: Never true  Transportation Needs: No Transportation Needs  . Lack of Transportation (Medical): No  . Lack of Transportation (Non-Medical): No  Physical Activity: Inactive  . Days of Exercise per Week: 0 days  . Minutes of Exercise per Session: 0 min  Stress: Stress Concern Present  . Feeling of Stress : Rather much  Social Connections: Not on file  Intimate Partner Violence: Not At Risk  . Fear of Current or Ex-Partner: No  . Emotionally Abused: No  . Physically Abused: No  . Sexually Abused: No    Past Surgical History:  Procedure Laterality Date  .  ABDOMINAL HYSTERECTOMY    . BREAST BIOPSY    . BREAST LUMPECTOMY Left 1990's  . CATARACT EXTRACTION, BILATERAL  08/012020    History reviewed. No pertinent family history.  Allergies  Allergen Reactions  . Penicillins Rash    Did it involve swelling of the face/tongue/throat, SOB, or low BP? Yes Did it involve sudden or severe rash/hives, skin peeling, or any reaction on the inside of your mouth or nose? Unk Did you need to seek medical attention at a hospital or doctor's office? Unk When did it last happen? "I was young" If all above answers are "NO", may proceed with cephalosporin use.     Current  Outpatient Medications on File Prior to Visit  Medication Sig Dispense Refill  . ASPIRIN LOW DOSE 81 MG EC tablet TAKE 1 TABLET BY MOUTH ONCE DAILY 90 tablet 2  . atorvastatin (LIPITOR) 40 MG tablet TAKE 1 TABLET BY MOUTH AT BEDTIME FOR CHOLESTEROL 90 tablet 3  . CALCIUM CITRATE + D 315-200 MG-UNIT tablet Take 1 tablet by mouth daily.  11  . donepezil (ARICEPT) 10 MG tablet Take 1 tablet (10 mg total) by mouth at bedtime. For memory. 90 tablet 3  . fesoterodine (TOVIAZ) 8 MG TB24 tablet TAKE 1 TABLET BY MOUTH EVERY DAY FOR OVERACTIVE BLADDER 90 tablet 1  . fluticasone (FLONASE) 50 MCG/ACT nasal spray INSTILL ONE (1) SPRAY IN EACH NOSTRIL TWICE DAILY AS NEEDED FOR ALLERGIES 16 g 2  . hydrOXYzine (ATARAX/VISTARIL) 10 MG tablet Take 1-2 tablets by mouth 30-60 minutes prior to travel as needed for anxiety. 6 tablet 0  . latanoprost (XALATAN) 0.005 % ophthalmic solution INSTILL 1 DROP TO BOTH EYES EVERY EVENING AT BEDTIME    . lisinopril (ZESTRIL) 5 MG tablet TAKE 1 TABLET BY MOUTH EVERY MORNING FOR BLOOD PRESSURE 90 tablet 2  . memantine (NAMENDA XR) 28 MG CP24 24 hr capsule TAKE ONE (1) CAPSULE BY MOUTH ONCE DAILY FOR MEMORY 90 capsule 2  . Multiple Vitamin (MULTI-VITAMINS) TABS Take 1 tablet by mouth daily.  11  . Omega-3 Fatty Acids (SEA-OMEGA 30) 1200 MG CAPS Take 1 capsule by mouth daily.  11  . omeprazole (PRILOSEC) 20 MG capsule TAKE 1 CAPSULE BY MOUTH EVERY DAY FOR HEARTBURN 90 capsule 1  . risperiDONE (RISPERDAL) 0.5 MG tablet Take 1 tablet by mouth at bedtime.    . sertraline (ZOLOFT) 25 MG tablet TAKE 1 TABLET BY MOUTH EVERY DAY FOR ANXIETY (TAKE WITH 50MG  DOSE) 90 tablet 2  . sertraline (ZOLOFT) 50 MG tablet Take 1 tablet (50 mg total) by mouth daily. For anxiety. 90 tablet 2  . STOOL SOFTENER 100 MG capsule TAKE TWO  2  CAPSULES BY MOUTH DAILY  11   No current facility-administered medications on file prior to visit.    BP 128/70   Pulse 75   Temp 98.2 F (36.8 C) (Temporal)   Ht  5\' 4"  (1.626 m)   Wt 198 lb 12.8 oz (90.2 kg)   SpO2 96%   BMI 34.12 kg/m  Objective:   Physical Exam Cardiovascular:     Rate and Rhythm: Normal rate and regular rhythm.  Pulmonary:     Effort: Pulmonary effort is normal.     Breath sounds: Normal breath sounds.  Musculoskeletal:     Cervical back: Neck supple.  Skin:    General: Skin is warm and dry.           Assessment & Plan:      This  visit occurred during the SARS-CoV-2 public health emergency.  Safety protocols were in place, including screening questions prior to the visit, additional usage of staff PPE, and extensive cleaning of exam room while observing appropriate contact time as indicated for disinfecting solutions.  

## 2021-02-19 NOTE — Assessment & Plan Note (Signed)
Doing well on Toviaz 8 mg daily, continue same.

## 2021-02-20 ENCOUNTER — Telehealth: Payer: Self-pay

## 2021-02-20 LAB — COMPREHENSIVE METABOLIC PANEL
ALT: 23 U/L (ref 0–35)
AST: 26 U/L (ref 0–37)
Albumin: 4.2 g/dL (ref 3.5–5.2)
Alkaline Phosphatase: 83 U/L (ref 39–117)
BUN: 12 mg/dL (ref 6–23)
CO2: 30 mEq/L (ref 19–32)
Calcium: 9.3 mg/dL (ref 8.4–10.5)
Chloride: 105 mEq/L (ref 96–112)
Creatinine, Ser: 1.15 mg/dL (ref 0.40–1.20)
GFR: 44.05 mL/min — ABNORMAL LOW (ref >60.00)
Glucose, Bld: 116 mg/dL — ABNORMAL HIGH (ref 70–99)
Potassium: 4 mEq/L (ref 3.5–5.1)
Sodium: 142 mEq/L (ref 135–145)
Total Bilirubin: 0.6 mg/dL (ref 0.2–1.2)
Total Protein: 7.2 g/dL (ref 6.0–8.3)

## 2021-02-20 LAB — LIPID PANEL
Cholesterol: 160 mg/dL (ref 0–200)
HDL: 68.4 mg/dL (ref >39.00)
LDL Cholesterol: 77 mg/dL (ref 0–99)
NonHDL: 91.73
Total CHOL/HDL Ratio: 2
Triglycerides: 76 mg/dL (ref 0.0–149.0)
VLDL: 15.2 mg/dL (ref 0.0–40.0)

## 2021-02-20 LAB — HEMOGLOBIN A1C: Hgb A1c MFr Bld: 6.2 % (ref 4.6–6.5)

## 2021-02-20 LAB — URIC ACID: Uric Acid, Serum: 3.6 mg/dL (ref 2.4–7.0)

## 2021-02-20 NOTE — Telephone Encounter (Signed)
Have called daughter l/m to call office to give fax number for form to be faxed.

## 2021-02-20 NOTE — Telephone Encounter (Signed)
Mrs. Lovena Le called back in with the fax number [phone]

## 2021-02-23 NOTE — Telephone Encounter (Signed)
Faxed as requested

## 2021-03-05 ENCOUNTER — Ambulatory Visit: Payer: Medicare Other

## 2021-03-17 DIAGNOSIS — H401122 Primary open-angle glaucoma, left eye, moderate stage: Secondary | ICD-10-CM | POA: Diagnosis not present

## 2021-03-27 ENCOUNTER — Other Ambulatory Visit: Payer: Self-pay

## 2021-03-27 ENCOUNTER — Ambulatory Visit (INDEPENDENT_AMBULATORY_CARE_PROVIDER_SITE_OTHER): Payer: Medicare Other

## 2021-03-27 DIAGNOSIS — Z0000 Encounter for general adult medical examination without abnormal findings: Secondary | ICD-10-CM

## 2021-03-27 NOTE — Progress Notes (Addendum)
Subjective:   Caroline French is a 86 y.o. female who presents for Medicare Annual (Subsequent) preventive examination.  Review of Systems: N/A     I connected with the patient today by telephone and verified that I am speaking with the correct person using two identifiers. Location patient: home Location nurse: work Persons participating in the telephone visit: patient, nurse.   I discussed the limitations, risks, security and privacy concerns of performing an evaluation and management service by telephone and the availability of in person appointments. I also discussed with the patient that there may be a patient responsible charge related to this service. The patient expressed understanding and verbally consented to this telephonic visit.        Cardiac Risk Factors include: advanced age (>28men, >46 women);hypertension;Other (see comment), Risk factor comments: hyperlipidemia     Objective:    Today's Vitals   There is no height or weight on file to calculate BMI.  Advanced Directives 03/27/2021 03/04/2020 01/24/2019 12/03/2018  Does Patient Have a Medical Advance Directive? No No No No  Would patient like information on creating a medical advance directive? No - Patient declined Yes (MAU/Ambulatory/Procedural Areas - Information given) No - Patient declined No - Patient declined    Current Medications (verified) Outpatient Encounter Medications as of 03/27/2021  Medication Sig   ASPIRIN LOW DOSE 81 MG EC tablet TAKE 1 TABLET BY MOUTH ONCE DAILY   atorvastatin (LIPITOR) 40 MG tablet TAKE 1 TABLET BY MOUTH AT BEDTIME FOR CHOLESTEROL   CALCIUM CITRATE + D 315-200 MG-UNIT tablet Take 1 tablet by mouth daily.   donepezil (ARICEPT) 10 MG tablet Take 1 tablet (10 mg total) by mouth at bedtime. For memory.   fesoterodine (TOVIAZ) 8 MG TB24 tablet TAKE 1 TABLET BY MOUTH EVERY DAY FOR OVERACTIVE BLADDER   fluticasone (FLONASE) 50 MCG/ACT nasal spray INSTILL ONE (1) SPRAY IN EACH NOSTRIL  TWICE DAILY AS NEEDED FOR ALLERGIES   hydrOXYzine (ATARAX/VISTARIL) 10 MG tablet Take 1-2 tablets by mouth 30-60 minutes prior to travel as needed for anxiety.   latanoprost (XALATAN) 0.005 % ophthalmic solution INSTILL 1 DROP TO BOTH EYES EVERY EVENING AT BEDTIME   lisinopril (ZESTRIL) 5 MG tablet TAKE 1 TABLET BY MOUTH EVERY MORNING FOR BLOOD PRESSURE   memantine (NAMENDA XR) 28 MG CP24 24 hr capsule TAKE ONE (1) CAPSULE BY MOUTH ONCE DAILY FOR MEMORY   Multiple Vitamin (MULTI-VITAMINS) TABS Take 1 tablet by mouth daily.   Omega-3 Fatty Acids (SEA-OMEGA 30) 1200 MG CAPS Take 1 capsule by mouth daily.   omeprazole (PRILOSEC) 20 MG capsule TAKE 1 CAPSULE BY MOUTH EVERY DAY FOR HEARTBURN   risperiDONE (RISPERDAL) 0.5 MG tablet Take 1 tablet by mouth at bedtime.   sertraline (ZOLOFT) 25 MG tablet TAKE 1 TABLET BY MOUTH EVERY DAY FOR ANXIETY (TAKE WITH 50MG  DOSE)   sertraline (ZOLOFT) 50 MG tablet Take 1 tablet (50 mg total) by mouth daily. For anxiety.   STOOL SOFTENER 100 MG capsule TAKE TWO  2  CAPSULES BY MOUTH DAILY   No facility-administered encounter medications on file as of 03/27/2021.    Allergies (verified) Penicillins   History: Past Medical History:  Diagnosis Date   Breast cancer ([place]) 6269'[id number]   left breast   Dementia with behavioral disturbance ([place])    Diverticulitis    Essential hypertension    Glaucoma    Hallucinations    Overactive bladder    Past Surgical History:  Procedure Laterality Date   ABDOMINAL  HYSTERECTOMY     BREAST BIOPSY     BREAST LUMPECTOMY Left 1990's   CATARACT EXTRACTION, BILATERAL  08/012020   History reviewed. No pertinent family history. Social History   Socioeconomic History   Marital status: Widowed    Spouse name: Not on file   Number of children: Not on file   Years of education: Not on file   Highest education level: Not on file  Occupational History   Not on file  Tobacco Use   Smoking status: Never   Smokeless tobacco:  Never  Vaping Use   Vaping Use: Never used  Substance and Sexual Activity   Alcohol use: Never   Drug use: Never   Sexual activity: Not Currently  Other Topics Concern   Not on file  Social History Narrative   Widow.   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   Moved from [place].   Social Determinants of Health   Financial Resource Strain: Low Risk    Difficulty of Paying Living Expenses: Not hard at all  Food Insecurity: No Food Insecurity   Worried About Charity fundraiser in the Last Year: Never true   [place] in the Last Year: Never true  Transportation Needs: No Transportation Needs   Lack of Transportation (Medical): No   Lack of Transportation (Non-Medical): No  Physical Activity: Insufficiently Active   Days of Exercise per Week: 7 days   Minutes of Exercise per Session: 10 min  Stress: No Stress Concern Present   Feeling of Stress : Not at all  Social Connections: Not on file    Tobacco Counseling Counseling given: Not Answered   Clinical Intake:  Pre-visit preparation completed: Yes  Pain : No/denies pain     Nutritional Risks: None Diabetes: No  How often do you need to have someone help you when you read instructions, pamphlets, or other written materials from your doctor or pharmacy?: 1 - Never What is the last grade level you completed in school?: Masters  Diabetic: No Nutrition Risk Assessment:  Has the patient had any N/V/D within the last 2 months?  No  Does the patient have any non-healing wounds?  No  Has the patient had any unintentional weight loss or weight gain?  No   Diabetes:  Is the patient diabetic?  No  If diabetic, was a CBG obtained today?   N/A Did the patient bring in their glucometer from home?   N/A How often do you monitor your CBG's? N/A.   Financial Strains and Diabetes Management:  Are you having any financial strains with the device, your supplies or your medication?  N/A .  Does the patient want to be seen by  Chronic Care Management for management of their diabetes?   N/A Would the patient like to be referred to a Nutritionist or for Diabetic Management?   N/A   Interpreter Needed?: No  Information entered by :: CJohnson, LPN   Activities of Daily Living In your present state of health, do you have any difficulty performing the following activities: 03/27/2021  Hearing? N  Vision? N  Difficulty concentrating or making decisions? Y  Comment some loss of memory noted.  Walking or climbing stairs? N  Dressing or bathing? N  Doing errands, shopping? Y  Preparing Food and eating ? N  Using the Toilet? N  In the past six months, have you accidently leaked urine? Y  Comment wears pad  Do you have problems with loss of bowel  control? N  Managing your Medications? Y  Managing your Finances? Y  Housekeeping or managing your Housekeeping? Y  Some recent data might be hidden    Patient Care Team: Pleas Koch, NP as PCP - General (Internal Medicine)  Indicate any recent Medical Services you may have received from other than [facility] providers in the past year (date may be approximate).     Assessment:   This is a routine wellness examination for Caroline French.  Hearing/Vision screen Vision Screening - Comments:: Patient gets annual eye exams  Dietary issues and exercise activities discussed: Current Exercise Habits: Home exercise routine, Type of exercise: walking, Time (Minutes): 15, Frequency (Times/Week): 7, Weekly Exercise (Minutes/Week): 105, Intensity: Moderate, Exercise limited by: None identified   Goals Addressed             This Visit's Progress    Patient Stated       03/27/2021, I will continue to walk 3-4 days a week for 15 minutes        Depression Screen PHQ 2/9 Scores 03/27/2021 02/19/2021 03/04/2020 01/24/2019  PHQ - 2 Score 0 0 0 0  PHQ- 9 Score 0 1 0 0    Fall Risk Fall Risk  03/27/2021 03/04/2020 01/24/2019  Falls in the past year? 0 0 0  Number falls in past yr:  0 0 -  Injury with Fall? 0 0 -  Risk for fall due to : Medication side effect Medication side effect -  Follow up Falls evaluation completed;Falls prevention discussed Falls evaluation completed;Falls prevention discussed -    FALL RISK PREVENTION PERTAINING TO THE HOME:  Any stairs in or around the home? Yes  If so, are there any without handrails? No  Home free of loose throw rugs in walkways, pet beds, electrical cords, etc? Yes  Adequate lighting in your home to reduce risk of falls? Yes   ASSISTIVE DEVICES UTILIZED TO PREVENT FALLS:  Life alert? No  Use of a cane, walker or w/c? No  Grab bars in the bathroom? No  Shower chair or bench in shower? No  Elevated toilet seat or a handicapped toilet? Yes   TIMED UP AND GO:  Was the test performed?  N/A telephone visit .   Cognitive Function: MMSE - Mini Mental State Exam 03/27/2021 03/04/2020 01/24/2019  Not completed: Unable to complete Unable to complete Unable to complete  Mini Cog  Mini-Cog screen was not completed. Loss of memory noted. Maximum score is 22. A value of 0 denotes this part of the MMSE was not completed or the patient failed this part of the Mini-Cog screening.       Immunizations Immunization History  Administered Date(s) Administered   Influenza Split 08/03/2004, 07/18/2009, 06/23/2011   Influenza, High Dose Seasonal PF 06/26/2012, 08/13/2013, 07/05/2014, 08/26/2015, 08/26/2016, 09/09/2017, 08/27/2019   Influenza,inj,Quad PF,6+ Mos 07/21/2018   Moderna Sars-Covid-2 Vaccination 11/13/2019, 12/11/2019   PPD Test 05/31/2018   Pneumococcal Conjugate-13 11/20/2013   Pneumococcal Polysaccharide-23 08/19/2011, 07/21/2018    TDAP status: Due, Education has been provided regarding the importance of this vaccine. Advised may receive this vaccine at local pharmacy or Health Dept. Aware to provide a copy of the vaccination record if obtained from local pharmacy or Health Dept. Verbalized acceptance and  understanding.  Flu Vaccine status: due Fall 2022  Pneumococcal vaccine status: Up to date  Covid-19 vaccine status: Completed 2 vaccines. Discussed booster.  Qualifies for Shingles Vaccine? Yes   Zostavax completed No   Shingrix Completed?: No.  Education has been provided regarding the importance of this vaccine. Patient has been advised to call insurance company to determine out of pocket expense if they have not yet received this vaccine. Advised may also receive vaccine at local pharmacy or Health Dept. Verbalized acceptance and understanding.  Screening Tests Health Maintenance  Topic Date Due   Zoster Vaccines- Shingrix (1 of 2) Never done   COVID-19 Vaccine (3 - Booster for Moderna series) 05/12/2020   TETANUS/TDAP  03/27/2022 (Originally 08/18/1954)   INFLUENZA VACCINE  05/11/2021   DEXA SCAN  Completed   PNA vac Low Risk Adult  Completed   HPV VACCINES  Aged Out    Health Maintenance  Health Maintenance Due  Topic Date Due   Zoster Vaccines- Shingrix (1 of 2) Never done   COVID-19 Vaccine (3 - Booster for Moderna series) 05/12/2020    Colorectal cancer screening: No longer required.   Mammogram status: Completed 12/30/2020. Repeat every year  Bone Density status: due, will discuss with provider  Lung Cancer Screening: (Low Dose CT Chest recommended if Age 20-80 years, 30 pack-year currently smoking OR have quit w/in 15years.) does not qualify.   Additional Screening:  Hepatitis C Screening: does not qualify; Completed N/A  Vision Screening: Recommended annual ophthalmology exams for early detection of glaucoma and other disorders of the eye. Is the patient up to date with their annual eye exam?  Yes  Who is the provider or what is the name of the office in which the patient attends annual eye exams? [facility] If pt is not established with a provider, would they like to be referred to a provider to establish care? No .   Dental Screening: Recommended annual  dental exams for proper oral hygiene  Community Resource Referral / Chronic Care Management: CRR required this visit?  No   CCM required this visit?  No      Plan:     I have personally reviewed and noted the following in the patient's chart:   Medical and social history Use of alcohol, tobacco or illicit drugs  Current medications and supplements including opioid prescriptions.  Functional ability and status Nutritional status Physical activity Advanced directives List of other physicians Hospitalizations, surgeries, and ER visits in previous 12 months Vitals Screenings to include cognitive, depression, and falls Referrals and appointments  In addition, I have reviewed and discussed with patient certain preventive protocols, quality metrics, and best practice recommendations. A written personalized care plan for preventive services as well as general preventive health recommendations were provided to patient.   Due to this being a telephonic visit, the after visit summary with patients personalized plan was offered to patient via mail or my-chart. Patient preferred to pick up at office at next visit.   Andrez Grime, LPN   [id number]

## 2021-03-27 NOTE — Patient Instructions (Signed)
Caroline French , Thank you for taking time to come for your Medicare Wellness Visit. I appreciate your ongoing commitment to your health goals. Please review the following plan we discussed and let me know if I can assist you in the future.   Screening recommendations/referrals: Colonoscopy: no longer required Mammogram: Up to date, completed 12/30/20, due 12/2021 Bone Density: due, will discuss with provider at physical Recommended yearly ophthalmology/optometry visit for glaucoma screening and checkup Recommended yearly dental visit for hygiene and checkup  Vaccinations: Influenza vaccine: due Fall 2022  Pneumococcal vaccine: Completed series Tdap vaccine: decline-insurance Shingles vaccine: due, check with your insurance regarding coverage of interested    Covid-19: 2 vaccines completed, discussed booster  Advanced directives: Advance directive discussed with you today. Even though you declined this today please call our office should you change your mind and we can give you the proper paperwork for you to fill out.   Conditions/risks identified: hypertension, hyperlipidemia   Next appointment: Follow up in one year for your annual wellness visit    Preventive Care 65 Years and Older, Female Preventive care refers to lifestyle choices and visits with your health care provider that can promote health and wellness. What does preventive care include? A yearly physical exam. This is also called an annual well check. Dental exams once or twice a year. Routine eye exams. Ask your health care provider how often you should have your eyes checked. Personal lifestyle choices, including: Daily care of your teeth and gums. Regular physical activity. Eating a healthy diet. Avoiding tobacco and drug use. Limiting alcohol use. Practicing safe sex. Taking low-dose aspirin every day. Taking vitamin and mineral supplements as recommended by your health care provider. What happens during an annual  well check? The services and screenings done by your health care provider during your annual well check will depend on your age, overall health, lifestyle risk factors, and family history of disease. Counseling  Your health care provider may ask you questions about your: Alcohol use. Tobacco use. Drug use. Emotional well-being. Home and relationship well-being. Sexual activity. Eating habits. History of falls. Memory and ability to understand (cognition). Work and work Statistician. Reproductive health. Screening  You may have the following tests or measurements: Height, weight, and BMI. Blood pressure. Lipid and cholesterol levels. These may be checked every 5 years, or more frequently if you are over 53 years old. Skin check. Lung cancer screening. You may have this screening every year starting at age 15 if you have a 30-pack-year history of smoking and currently smoke or have quit within the past 15 years. Fecal occult blood test (FOBT) of the stool. You may have this test every year starting at age 76. Flexible sigmoidoscopy or colonoscopy. You may have a sigmoidoscopy every 5 years or a colonoscopy every 10 years starting at age 65. Hepatitis C blood test. Hepatitis B blood test. Sexually transmitted disease (STD) testing. Diabetes screening. This is done by checking your blood sugar (glucose) after you have not eaten for a while (fasting). You may have this done every 1-3 years. Bone density scan. This is done to screen for osteoporosis. You may have this done starting at age 24. Mammogram. This may be done every 1-2 years. Talk to your health care provider about how often you should have regular mammograms. Talk with your health care provider about your test results, treatment options, and if necessary, the need for more tests. Vaccines  Your health care provider may recommend certain vaccines, such as:  Influenza vaccine. This is recommended every year. Tetanus, diphtheria,  and acellular pertussis (Tdap, Td) vaccine. You may need a Td booster every 10 years. Zoster vaccine. You may need this after age 18. Pneumococcal 13-valent conjugate (PCV13) vaccine. One dose is recommended after age 69. Pneumococcal polysaccharide (PPSV23) vaccine. One dose is recommended after age 70. Talk to your health care provider about which screenings and vaccines you need and how often you need them. This information is not intended to replace advice given to you by your health care provider. Make sure you discuss any questions you have with your health care provider. Document Released: 10/24/2015 Document Revised: 06/16/2016 Document Reviewed: 07/29/2015 Elsevier Interactive Patient Education  2017 [place] Prevention in the Home Falls can cause injuries. They can happen to people of all ages. There are many things you can do to make your home safe and to help prevent falls. What can I do on the outside of my home? Regularly fix the edges of walkways and driveways and fix any cracks. Remove anything that might make you trip as you walk through a door, such as a raised step or threshold. Trim any bushes or trees on the path to your home. Use bright outdoor lighting. Clear any walking paths of anything that might make someone trip, such as rocks or tools. Regularly check to see if handrails are loose or broken. Make sure that both sides of any steps have handrails. Any raised decks and porches should have guardrails on the edges. Have any leaves, snow, or ice cleared regularly. Use sand or salt on walking paths during winter. Clean up any spills in your garage right away. This includes oil or grease spills. What can I do in the bathroom? Use night lights. Install grab bars by the toilet and in the tub and shower. Do not use towel bars as grab bars. Use non-skid mats or decals in the tub or shower. If you need to sit down in the shower, use a plastic, non-slip  stool. Keep the floor dry. Clean up any water that spills on the floor as soon as it happens. Remove soap buildup in the tub or shower regularly. Attach bath mats securely with double-sided non-slip rug tape. Do not have throw rugs and other things on the floor that can make you trip. What can I do in the bedroom? Use night lights. Make sure that you have a light by your bed that is easy to reach. Do not use any sheets or blankets that are too big for your bed. They should not hang down onto the floor. Have a firm chair that has side arms. You can use this for support while you get dressed. Do not have throw rugs and other things on the floor that can make you trip. What can I do in the kitchen? Clean up any spills right away. Avoid walking on wet floors. Keep items that you use a lot in easy-to-reach places. If you need to reach something above you, use a strong step stool that has a grab bar. Keep electrical cords out of the way. Do not use floor polish or wax that makes floors slippery. If you must use wax, use non-skid floor wax. Do not have throw rugs and other things on the floor that can make you trip. What can I do with my stairs? Do not leave any items on the stairs. Make sure that there are handrails on both sides of the stairs and use them.  Fix handrails that are broken or loose. Make sure that handrails are as long as the stairways. Check any carpeting to make sure that it is firmly attached to the stairs. Fix any carpet that is loose or worn. Avoid having throw rugs at the top or bottom of the stairs. If you do have throw rugs, attach them to the floor with carpet tape. Make sure that you have a light switch at the top of the stairs and the bottom of the stairs. If you do not have them, ask someone to add them for you. What else can I do to help prevent falls? Wear shoes that: Do not have high heels. Have rubber bottoms. Are comfortable and fit you well. Are closed at the  toe. Do not wear sandals. If you use a stepladder: Make sure that it is fully opened. Do not climb a closed stepladder. Make sure that both sides of the stepladder are locked into place. Ask someone to hold it for you, if possible. Clearly mark and make sure that you can see: Any grab bars or handrails. First and last steps. Where the edge of each step is. Use tools that help you move around (mobility aids) if they are needed. These include: Canes. Walkers. Scooters. Crutches. Turn on the lights when you go into a dark area. Replace any light bulbs as soon as they burn out. Set up your furniture so you have a clear path. Avoid moving your furniture around. If any of your floors are uneven, fix them. If there are any pets around you, be aware of where they are. Review your medicines with your doctor. Some medicines can make you feel dizzy. This can increase your chance of falling. Ask your doctor what other things that you can do to help prevent falls. This information is not intended to replace advice given to you by your health care provider. Make sure you discuss any questions you have with your health care provider. Document Released: 07/24/2009 Document Revised: 03/04/2016 Document Reviewed: 11/01/2014 Elsevier Interactive Patient Education  2017 [employer].

## 2021-03-27 NOTE — Progress Notes (Signed)
PCP notes:  Health Maintenance: Covid booster- due Dexa- due Shingrix- due   Abnormal Screenings: none   Patient concerns: none   Nurse concerns: none   Next PCP appt.: none

## 2021-03-31 ENCOUNTER — Other Ambulatory Visit: Payer: Self-pay | Admitting: Primary Care

## 2021-03-31 DIAGNOSIS — K219 Gastro-esophageal reflux disease without esophagitis: Secondary | ICD-10-CM

## 2021-04-29 ENCOUNTER — Other Ambulatory Visit: Payer: Self-pay | Admitting: Primary Care

## 2021-04-29 DIAGNOSIS — J302 Other seasonal allergic rhinitis: Secondary | ICD-10-CM

## 2021-04-29 DIAGNOSIS — N3281 Overactive bladder: Secondary | ICD-10-CM

## 2021-04-29 DIAGNOSIS — I10 Essential (primary) hypertension: Secondary | ICD-10-CM

## 2021-04-29 DIAGNOSIS — F0391 Unspecified dementia with behavioral disturbance: Secondary | ICD-10-CM

## 2021-06-08 ENCOUNTER — Telehealth: Payer: Self-pay | Admitting: Primary Care

## 2021-06-08 DIAGNOSIS — F418 Other specified anxiety disorders: Secondary | ICD-10-CM

## 2021-06-08 DIAGNOSIS — F0391 Unspecified dementia with behavioral disturbance: Secondary | ICD-10-CM

## 2021-06-08 NOTE — Telephone Encounter (Signed)
Do you want me to call and set up virtual with patient?

## 2021-06-08 NOTE — Telephone Encounter (Signed)
Mrs. Caroline French called in for her mom due to they are traveling and wanted to know if [facility] Kraft could write a prescription for her for her anxiety due to she is going back to school twice a week and she is traveling

## 2021-06-09 MED ORDER — HYDROXYZINE HCL 10 MG PO TABS: ORAL_TABLET | ORAL | 0 refills | Status: DC

## 2021-06-09 NOTE — Telephone Encounter (Signed)
Please notify [name] that I am more than willing to send refills of her PRN anti-anxiety medication called hydroxyzine to the pharmacy. As mentioned before, this medication can cause drowsiness.   Verify pharmacy and send as pended.   Have them call us if they require anything further.

## 2021-06-09 NOTE — Telephone Encounter (Signed)
Called spoke to Daughter she was informed and would like to have called in to [facility]. Have sent as pended. Aware may cause drowsiness.  Will call if any questions.

## 2021-06-16 ENCOUNTER — Other Ambulatory Visit: Payer: Self-pay | Admitting: Primary Care

## 2021-06-16 DIAGNOSIS — F418 Other specified anxiety disorders: Secondary | ICD-10-CM

## 2021-06-16 DIAGNOSIS — F0391 Unspecified dementia with behavioral disturbance: Secondary | ICD-10-CM

## 2021-06-17 ENCOUNTER — Other Ambulatory Visit: Payer: Self-pay | Admitting: Primary Care

## 2021-06-17 DIAGNOSIS — F0391 Unspecified dementia with behavioral disturbance: Secondary | ICD-10-CM

## 2021-08-11 ENCOUNTER — Other Ambulatory Visit: Payer: Self-pay

## 2021-08-11 ENCOUNTER — Ambulatory Visit (INDEPENDENT_AMBULATORY_CARE_PROVIDER_SITE_OTHER): Payer: Medicare Other | Admitting: Primary Care

## 2021-08-11 VITALS — BP 120/60 | HR 65 | Temp 96.1°F | Ht 64.0 in | Wt 193.6 lb

## 2021-08-11 DIAGNOSIS — F03918 Unspecified dementia, unspecified severity, with other behavioral disturbance: Secondary | ICD-10-CM | POA: Diagnosis not present

## 2021-08-11 DIAGNOSIS — R4182 Altered mental status, unspecified: Secondary | ICD-10-CM

## 2021-08-11 DIAGNOSIS — Z23 Encounter for immunization: Secondary | ICD-10-CM

## 2021-08-11 NOTE — Progress Notes (Signed)
Subjective:    Patient ID: Caroline French, female    DOB: 12/13/1934, 86 y.o.   MRN: [id number]  HPI  Caroline French is a very pleasant 86 y.o. female with a history of hypertension, dementia with behavorial disturbance, overactive bladder, prediabetes, glaucoma, altered mental status who presents today with her daughter to discuss dementia.  Following with neurology, Dr. Melrose Nakayama through [facility], and is managed on donepezil 10 mg, memantine XR 28 mg, risperidone 0.5 mg HS.   We are managing sertraline for which she takes 75 mg total daily, uses hydroxyzine during travel. Both regimens work well overall per daughter.   Her daughter endorses her behavior has been well managed overall, especially since she was initiated on Risperdal 0.5 mg in May 2022.   About two weeks ago her daughter noticed a three day history of her mother hearing water noises, seeing things on the bedroom ceiling at night, and asking where family members are during the night. The adult daycare called her daughter as they noticed increased stiffness to her knee joints.  She's not exhibited this behavior in numerous months.   Her daughter denies fevers, foul smelling urine. The patient is drinking some water daily.     Review of Systems  Constitutional:  Negative for fever.  Gastrointestinal:  Negative for abdominal pain.  Genitourinary:  Negative for dysuria, frequency and hematuria.        Past Medical History:  Diagnosis Date   Breast cancer [facility]) 4098'[id number]   left breast   Dementia with behavioral disturbance ([place])    Diverticulitis    Essential hypertension    Glaucoma    Hallucinations    Overactive bladder     Social History   Socioeconomic History   Marital status: Widowed    Spouse name: Not on file   Number of children: Not on file   Years of education: Not on file   Highest education level: Not on file  Occupational History   Not on file  Tobacco Use   Smoking status: Never   Smokeless  tobacco: Never  Vaping Use   Vaping Use: Never used  Substance and Sexual Activity   Alcohol use: Never   Drug use: Never   Sexual activity: Not Currently  Other Topics Concern   Not on file  Social History Narrative   Widow.   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   Moved from [place].   Social Determinants of Health   Financial Resource Strain: Low Risk    Difficulty of Paying Living Expenses: Not hard at all  Food Insecurity: No Food Insecurity   Worried About Charity fundraiser in the Last Year: Never true   [place] in the Last Year: Never true  Transportation Needs: No Transportation Needs   Lack of Transportation (Medical): No   Lack of Transportation (Non-Medical): No  Physical Activity: Insufficiently Active   Days of Exercise per Week: 7 days   Minutes of Exercise per Session: 10 min  Stress: No Stress Concern Present   Feeling of Stress : Not at all  Social Connections: Not on file  Intimate Partner Violence: Not At Risk   Fear of Current or Ex-Partner: No   Emotionally Abused: No   Physically Abused: No   Sexually Abused: No    Past Surgical History:  Procedure Laterality Date   ABDOMINAL HYSTERECTOMY     BREAST BIOPSY     BREAST LUMPECTOMY Left 1990's   CATARACT EXTRACTION, BILATERAL  08/012020    No family history on file.  Allergies  Allergen Reactions   Penicillins Rash    Did it involve swelling of the face/tongue/throat, SOB, or low BP? Yes Did it involve sudden or severe rash/hives, skin peeling, or any reaction on the inside of your mouth or nose? Unk Did you need to seek medical attention at a hospital or doctor's office? Unk When did it last happen? "I was young" If all above answers are "NO", may proceed with cephalosporin use.     Current Outpatient Medications on File Prior to Visit  Medication Sig Dispense Refill   ASPIRIN LOW DOSE 81 MG EC tablet TAKE 1 TABLET BY MOUTH ONCE DAILY 90 tablet 2   atorvastatin (LIPITOR) 40 MG tablet  TAKE 1 TABLET BY MOUTH AT BEDTIME FOR CHOLESTEROL 90 tablet 3   CALCIUM CITRATE + D 315-200 MG-UNIT tablet Take 1 tablet by mouth daily.  11   donepezil (ARICEPT) 10 MG tablet Take 1 tablet (10 mg total) by mouth at bedtime. For memory. 90 tablet 3   fluticasone (FLONASE) 50 MCG/ACT nasal spray INSTILL 1 SPRAY IN EACH NOSTRIL TWICE DAILY AS NEEDED FOR ALLERGIES 48 g 1   hydrOXYzine (ATARAX/VISTARIL) 10 MG tablet Take 1-2 tablets by mouth 30-60 minutes prior to travel as needed for anxiety. 30 tablet 0   latanoprost (XALATAN) 0.005 % ophthalmic solution INSTILL 1 DROP TO BOTH EYES EVERY EVENING AT BEDTIME     lisinopril (ZESTRIL) 5 MG tablet TAKE 1 TABLET BY MOUTH EVERY MORNING FOR BLOOD PRESSURE 90 tablet 2   memantine (NAMENDA XR) 28 MG CP24 24 hr capsule TAKE ONE (1) CAPSULE BY MOUTH ONCE DAILY FOR MEMORY 90 capsule 2   Multiple Vitamin (MULTI-VITAMINS) TABS Take 1 tablet by mouth daily.  11   Omega-3 Fatty Acids (SEA-OMEGA 30) 1200 MG CAPS Take 1 capsule by mouth daily.  11   omeprazole (PRILOSEC) 20 MG capsule TAKE 1 CAPSULE BY MOUTH EVERY DAY FOR HEARTBURN 90 capsule 3   risperiDONE (RISPERDAL) 0.5 MG tablet Take 1 tablet by mouth at bedtime.     sertraline (ZOLOFT) 25 MG tablet TAKE 1 TABLET BY MOUTH EVERY DAY FOR ANXIETY ALONG WITH 50MG  DOSE 90 tablet 2   sertraline (ZOLOFT) 50 MG tablet Take 1 tablet (50 mg total) by mouth daily. For anxiety. Take with 25 mg tablet. 90 tablet 2   STOOL SOFTENER 100 MG capsule TAKE TWO  2  CAPSULES BY MOUTH DAILY  11   TOVIAZ 8 MG TB24 tablet TAKE 1 TABLET BY MOUTH ONCE DAILY FOR OVERACTIVE BLADDER 90 tablet 2   No current facility-administered medications on file prior to visit.    BP 120/60   Pulse 65   Temp (!) 96.1 F (35.6 C) (Temporal)   Ht 5\' 4"  (1.626 m)   Wt 193 lb 9 oz (87.8 kg)   SpO2 96%   BMI 33.22 kg/m  Objective:   Physical Exam Cardiovascular:     Rate and Rhythm: Normal rate and regular rhythm.  Pulmonary:     Effort:  Pulmonary effort is normal.     Breath sounds: Normal breath sounds.  Musculoskeletal:     Cervical back: Neck supple.  Skin:    General: Skin is warm and dry.  Neurological:     Comments: Alert and following commands.  Unable to answer all questions.  Psychiatric:        Mood and Affect: Mood normal.  Assessment & Plan:      This visit occurred during the SARS-CoV-2 public health emergency.  Safety protocols were in place, including screening questions prior to the visit, additional usage of staff PPE, and extensive cleaning of exam room while observing appropriate contact time as indicated for disinfecting solutions.

## 2021-08-11 NOTE — Assessment & Plan Note (Signed)
Improved and stable now.  Continue donepezil 10 mg, memantine XR 28 mg, risperidone 0.5 mg HS per Neurology.  Continue Zoloft 75 mg daily.

## 2021-08-11 NOTE — Patient Instructions (Signed)
Continue medications as prescribed by myself and Dr. Melrose Nakayama.  Continue to drink water everyday.   We will see you in mid May 2023.  It was a pleasure to see you today!

## 2021-08-11 NOTE — Assessment & Plan Note (Addendum)
Brief three day episode last week, resolved. Exam today unremarkable and stable for baseline.   Continue prescribed demential regimen.  Unable to collect UA today. Home UA kit provided to daughter to use if symptoms returned. She agreed.

## 2021-09-23 ENCOUNTER — Other Ambulatory Visit: Payer: Self-pay | Admitting: Primary Care

## 2021-09-23 DIAGNOSIS — F418 Other specified anxiety disorders: Secondary | ICD-10-CM

## 2021-09-23 DIAGNOSIS — F03918 Unspecified dementia, unspecified severity, with other behavioral disturbance: Secondary | ICD-10-CM

## 2021-10-03 ENCOUNTER — Other Ambulatory Visit: Payer: Self-pay | Admitting: Primary Care

## 2021-10-03 DIAGNOSIS — F03918 Unspecified dementia, unspecified severity, with other behavioral disturbance: Secondary | ICD-10-CM

## 2021-10-03 DIAGNOSIS — F418 Other specified anxiety disorders: Secondary | ICD-10-CM

## 2021-10-13 ENCOUNTER — Other Ambulatory Visit: Payer: Self-pay | Admitting: Primary Care

## 2021-10-13 DIAGNOSIS — I10 Essential (primary) hypertension: Secondary | ICD-10-CM

## 2021-10-13 DIAGNOSIS — E785 Hyperlipidemia, unspecified: Secondary | ICD-10-CM

## 2021-12-10 ENCOUNTER — Telehealth: Payer: Self-pay

## 2021-12-10 NOTE — Telephone Encounter (Signed)
Daughter called eye doctor is out of office and she has message in there office to call. Let her know that it is not something that kate prescribes and there should be someone at eye doctors office that will handel in her providers absence. She will let me know tomorrow if they are not willing to fill and we will see if there are any other options.  ?

## 2021-12-13 NOTE — Telephone Encounter (Signed)
Noted  

## 2021-12-16 ENCOUNTER — Ambulatory Visit: Payer: Medicare Other | Admitting: Family

## 2021-12-17 ENCOUNTER — Ambulatory Visit: Payer: Medicare Other | Admitting: Family

## 2021-12-22 ENCOUNTER — Emergency Department (HOSPITAL_COMMUNITY): Payer: Medicare Other

## 2021-12-22 ENCOUNTER — Other Ambulatory Visit: Payer: Self-pay

## 2021-12-22 ENCOUNTER — Other Ambulatory Visit: Payer: Self-pay | Admitting: Primary Care

## 2021-12-22 ENCOUNTER — Inpatient Hospital Stay (HOSPITAL_COMMUNITY)
Admission: EM | Admit: 2021-12-22 | Discharge: 2021-12-28 | DRG: 178 | Disposition: A | Discharge status: Skilled Nursing Facility | Payer: Medicare Other | Attending: Internal Medicine | Admitting: Internal Medicine

## 2021-12-22 ENCOUNTER — Ambulatory Visit: Payer: Medicare Other | Admitting: Family

## 2021-12-22 ENCOUNTER — Encounter (HOSPITAL_COMMUNITY): Payer: Self-pay | Admitting: Emergency Medicine

## 2021-12-22 DIAGNOSIS — H409 Unspecified glaucoma: Secondary | ICD-10-CM | POA: Diagnosis not present

## 2021-12-22 DIAGNOSIS — R509 Fever, unspecified: Secondary | ICD-10-CM | POA: Diagnosis not present

## 2021-12-22 DIAGNOSIS — J189 Pneumonia, unspecified organism: Secondary | ICD-10-CM

## 2021-12-22 DIAGNOSIS — K219 Gastro-esophageal reflux disease without esophagitis: Secondary | ICD-10-CM | POA: Diagnosis not present

## 2021-12-22 DIAGNOSIS — R627 Adult failure to thrive: Secondary | ICD-10-CM | POA: Diagnosis present

## 2021-12-22 DIAGNOSIS — Z7982 Long term (current) use of aspirin: Secondary | ICD-10-CM | POA: Diagnosis not present

## 2021-12-22 DIAGNOSIS — R9431 Abnormal electrocardiogram [ECG] [EKG]: Secondary | ICD-10-CM | POA: Diagnosis not present

## 2021-12-22 DIAGNOSIS — Z7401 Bed confinement status: Secondary | ICD-10-CM | POA: Diagnosis not present

## 2021-12-22 DIAGNOSIS — Z20822 Contact with and (suspected) exposure to covid-19: Secondary | ICD-10-CM | POA: Diagnosis present

## 2021-12-22 DIAGNOSIS — Z880 Allergy status to penicillin: Secondary | ICD-10-CM

## 2021-12-22 DIAGNOSIS — I10 Essential (primary) hypertension: Secondary | ICD-10-CM | POA: Diagnosis present

## 2021-12-22 DIAGNOSIS — E669 Obesity, unspecified: Secondary | ICD-10-CM | POA: Diagnosis present

## 2021-12-22 DIAGNOSIS — Z79899 Other long term (current) drug therapy: Secondary | ICD-10-CM | POA: Diagnosis not present

## 2021-12-22 DIAGNOSIS — N183 Chronic kidney disease, stage 3 unspecified: Secondary | ICD-10-CM | POA: Diagnosis not present

## 2021-12-22 DIAGNOSIS — A419 Sepsis, unspecified organism: Secondary | ICD-10-CM

## 2021-12-22 DIAGNOSIS — Z6833 Body mass index (BMI) 33.0-33.9, adult: Secondary | ICD-10-CM

## 2021-12-22 DIAGNOSIS — N3281 Overactive bladder: Secondary | ICD-10-CM | POA: Diagnosis present

## 2021-12-22 DIAGNOSIS — N1831 Chronic kidney disease, stage 3a: Secondary | ICD-10-CM | POA: Diagnosis present

## 2021-12-22 DIAGNOSIS — N179 Acute kidney failure, unspecified: Secondary | ICD-10-CM | POA: Diagnosis present

## 2021-12-22 DIAGNOSIS — R609 Edema, unspecified: Secondary | ICD-10-CM | POA: Diagnosis not present

## 2021-12-22 DIAGNOSIS — K5900 Constipation, unspecified: Secondary | ICD-10-CM | POA: Diagnosis not present

## 2021-12-22 DIAGNOSIS — M6281 Muscle weakness (generalized): Secondary | ICD-10-CM | POA: Diagnosis not present

## 2021-12-22 DIAGNOSIS — E876 Hypokalemia: Secondary | ICD-10-CM | POA: Diagnosis not present

## 2021-12-22 DIAGNOSIS — F0150 Vascular dementia without behavioral disturbance: Secondary | ICD-10-CM | POA: Diagnosis not present

## 2021-12-22 DIAGNOSIS — R2689 Other abnormalities of gait and mobility: Secondary | ICD-10-CM | POA: Diagnosis not present

## 2021-12-22 DIAGNOSIS — Z1231 Encounter for screening mammogram for malignant neoplasm of breast: Secondary | ICD-10-CM

## 2021-12-22 DIAGNOSIS — R278 Other lack of coordination: Secondary | ICD-10-CM | POA: Diagnosis not present

## 2021-12-22 DIAGNOSIS — Z853 Personal history of malignant neoplasm of breast: Secondary | ICD-10-CM

## 2021-12-22 DIAGNOSIS — R0689 Other abnormalities of breathing: Secondary | ICD-10-CM | POA: Diagnosis not present

## 2021-12-22 DIAGNOSIS — I5032 Chronic diastolic (congestive) heart failure: Secondary | ICD-10-CM | POA: Diagnosis present

## 2021-12-22 DIAGNOSIS — I959 Hypotension, unspecified: Secondary | ICD-10-CM | POA: Diagnosis not present

## 2021-12-22 DIAGNOSIS — F03918 Unspecified dementia, unspecified severity, with other behavioral disturbance: Secondary | ICD-10-CM

## 2021-12-22 DIAGNOSIS — F0390 Unspecified dementia without behavioral disturbance: Secondary | ICD-10-CM | POA: Diagnosis present

## 2021-12-22 DIAGNOSIS — I130 Hypertensive heart and chronic kidney disease with heart failure and stage 1 through stage 4 chronic kidney disease, or unspecified chronic kidney disease: Secondary | ICD-10-CM | POA: Diagnosis present

## 2021-12-22 DIAGNOSIS — R4182 Altered mental status, unspecified: Secondary | ICD-10-CM | POA: Diagnosis not present

## 2021-12-22 DIAGNOSIS — J690 Pneumonitis due to inhalation of food and vomit: Principal | ICD-10-CM | POA: Diagnosis present

## 2021-12-22 DIAGNOSIS — K5792 Diverticulitis of intestine, part unspecified, without perforation or abscess without bleeding: Secondary | ICD-10-CM | POA: Diagnosis not present

## 2021-12-22 DIAGNOSIS — R531 Weakness: Secondary | ICD-10-CM | POA: Diagnosis not present

## 2021-12-22 DIAGNOSIS — F418 Other specified anxiety disorders: Secondary | ICD-10-CM

## 2021-12-22 DIAGNOSIS — I509 Heart failure, unspecified: Secondary | ICD-10-CM | POA: Diagnosis not present

## 2021-12-22 DIAGNOSIS — R1312 Dysphagia, oropharyngeal phase: Secondary | ICD-10-CM | POA: Diagnosis not present

## 2021-12-22 DIAGNOSIS — J188 Other pneumonia, unspecified organism: Secondary | ICD-10-CM | POA: Diagnosis not present

## 2021-12-22 HISTORY — DX: Pneumonia, unspecified organism: J18.9

## 2021-12-22 LAB — COMPREHENSIVE METABOLIC PANEL
ALT: 30 U/L (ref 0–44)
AST: 33 U/L (ref 15–41)
Albumin: 3.8 g/dL (ref 3.5–5.0)
Alkaline Phosphatase: 86 U/L (ref 38–126)
Anion gap: 9 (ref 5–15)
BUN: 14 mg/dL (ref 8–23)
CO2: 26 mmol/L (ref 22–32)
Calcium: 9.1 mg/dL (ref 8.9–10.3)
Chloride: 104 mmol/L (ref 98–111)
Creatinine, Ser: 1.17 mg/dL — ABNORMAL HIGH (ref 0.44–1.00)
GFR, Estimated: 46 mL/min — ABNORMAL LOW (ref >60)
Glucose, Bld: 216 mg/dL — ABNORMAL HIGH (ref 70–99)
Potassium: 3.7 mmol/L (ref 3.5–5.1)
Sodium: 139 mmol/L (ref 135–145)
Total Bilirubin: 0.6 mg/dL (ref 0.3–1.2)
Total Protein: 7.8 g/dL (ref 6.5–8.1)

## 2021-12-22 LAB — CBC WITH DIFFERENTIAL/PLATELET
Abs Immature Granulocytes: 0.03 10*3/uL (ref 0.00–0.07)
Basophils Absolute: 0 10*3/uL (ref 0.0–0.1)
Basophils Relative: 0 %
Eosinophils Absolute: 0 10*3/uL (ref 0.0–0.5)
Eosinophils Relative: 0 %
HCT: 43.6 % (ref 36.0–46.0)
Hemoglobin: 14 g/dL (ref 12.0–15.0)
Immature Granulocytes: 0 %
Lymphocytes Relative: 5 %
Lymphs Abs: 0.5 10*3/uL — ABNORMAL LOW (ref 0.7–4.0)
MCH: 31 pg (ref 26.0–34.0)
MCHC: 32.1 g/dL (ref 30.0–36.0)
MCV: 96.5 fL (ref 80.0–100.0)
Monocytes Absolute: 0.6 10*3/uL (ref 0.1–1.0)
Monocytes Relative: 6 %
Neutro Abs: 8.7 10*3/uL — ABNORMAL HIGH (ref 1.7–7.7)
Neutrophils Relative %: 89 %
Platelets: 164 10*3/uL (ref 150–400)
RBC: 4.52 MIL/uL (ref 3.87–5.11)
RDW: 15 % (ref 11.5–15.5)
WBC: 9.9 10*3/uL (ref 4.0–10.5)
nRBC: 0 % (ref 0.0–0.2)

## 2021-12-22 LAB — RESP PANEL BY RT-PCR (FLU A&B, COVID) ARPGX2
Influenza A by PCR: NEGATIVE
Influenza B by PCR: NEGATIVE
SARS Coronavirus 2 by RT PCR: NEGATIVE

## 2021-12-22 LAB — LACTIC ACID, PLASMA
Lactic Acid, Venous: 2 mmol/L (ref 0.5–1.9)
Lactic Acid, Venous: 1.7 mmol/L (ref 0.5–1.9)

## 2021-12-22 LAB — PROTIME-INR
INR: 1.2 (ref 0.8–1.2)
Prothrombin Time: 15.4 seconds — ABNORMAL HIGH (ref 11.4–15.2)

## 2021-12-22 LAB — APTT: aPTT: 33 seconds (ref 24–36)

## 2021-12-22 MED ORDER — LEVOFLOXACIN IN D5W 750 MG/150ML IV SOLN
750.0000 mg | Freq: Once | INTRAVENOUS | Status: AC
  Administered 2021-12-22: 750 mg via INTRAVENOUS
  Filled 2021-12-22: qty 150

## 2021-12-22 MED ORDER — LACTATED RINGERS IV BOLUS (SEPSIS)
1000.0000 mL | Freq: Once | INTRAVENOUS | Status: AC
  Administered 2021-12-22: 1000 mL via INTRAVENOUS

## 2021-12-22 MED ORDER — ACETAMINOPHEN 325 MG PO TABS
650.0000 mg | ORAL_TABLET | Freq: Once | ORAL | Status: AC
  Administered 2021-12-22: 650 mg via ORAL
  Filled 2021-12-22: qty 2

## 2021-12-22 NOTE — ED Provider Notes (Signed)
?[facility] DEPT ?Provider Note ? ? ?CSN: [id number] ?Arrival date & time: 12/22/21  2051 ? ?  ? ?History ? ?Chief Complaint  ?Patient presents with  ? Fever  ? ? ?Caroline French is a 87 y.o. female.  Level 5 caveat secondary to dementia.  She is brought in from home by EMS for evaluation of a fever.  Patient cannot give me any significant history.  Lives with daughter.  She does says she has had a cough and she has a chronically swollen right leg. ? ?The history is provided by the patient and the EMS personnel.  ?Fever ?Max temp prior to arrival:  102 ?Duration:  1 day ?Timing:  Constant ?Progression:  Unchanged ?Chronicity:  New ?Relieved by:  None tried ?Worsened by:  Nothing ?Ineffective treatments:  None tried ? ?  ? ?Home Medications ?Prior to Admission medications   ?Medication Sig Start Date End Date Taking? Authorizing Provider  ?aspirin (ASPIRIN LOW DOSE) 81 MG EC tablet TAKE 1 TABLET BY MOUTH ONCE DAILY 10/14/21   Pleas Koch, NP  ?atorvastatin (LIPITOR) 40 MG tablet Take 1 tablet (40 mg total) by mouth daily. For cholesterol. 10/14/21   Pleas Koch, NP  ?CALCIUM CITRATE + D 315-200 MG-UNIT tablet Take 1 tablet by mouth daily. 01/11/18   [provider]  ?donepezil (ARICEPT) 10 MG tablet Take 1 tablet (10 mg total) by mouth at bedtime. For memory. 01/30/19   Pleas Koch, NP  ?fluticasone (FLONASE) 50 MCG/ACT nasal spray INSTILL 1 SPRAY IN EACH NOSTRIL TWICE DAILY AS NEEDED FOR ALLERGIES 04/30/21   Pleas Koch, NP  ?hydrOXYzine (ATARAX) 10 MG tablet TAKE 1-2 TABLETS BY MOUTH 30-60 MINUTES PRIOR TO TRAVEL AS NEEDED FOR ANXIETY. 09/23/21   Pleas Koch, NP  ?latanoprost (XALATAN) 0.005 % ophthalmic solution INSTILL 1 DROP TO BOTH EYES EVERY EVENING AT BEDTIME 04/20/17   [provider]  ?lisinopril (ZESTRIL) 5 MG tablet TAKE 1 TABLET BY MOUTH EVERY MORNING FOR BLOOD PRESSURE 04/30/21   Pleas Koch, NP  ?memantine (NAMENDA XR) 28 MG  CP24 24 hr capsule TAKE ONE (1) CAPSULE BY MOUTH ONCE DAILY FOR MEMORY 06/18/20   Pleas Koch, NP  ?Multiple Vitamin (MULTI-VITAMINS) TABS Take 1 tablet by mouth daily. 01/11/18   [provider]  ?Omega-3 Fatty Acids (SEA-OMEGA 30) 1200 MG CAPS Take 1 capsule by mouth daily. 01/11/18   [provider]  ?omeprazole (PRILOSEC) 20 MG capsule TAKE 1 CAPSULE BY MOUTH EVERY DAY FOR HEARTBURN 04/01/21   Pleas Koch, NP  ?risperiDONE (RISPERDAL) 0.5 MG tablet Take 1 tablet by mouth at bedtime. 02/09/21   [provider]  ?sertraline (ZOLOFT) 25 MG tablet TAKE 1 TABLET BY MOUTH EVERY DAY FOR ANXIETY ALONG WITH '50MG'$  DOSE 06/18/21   Pleas Koch, NP  ?sertraline (ZOLOFT) 50 MG tablet Take 1 tablet (50 mg total) by mouth daily. For anxiety. Take with 25 mg tablet. 04/30/21   Pleas Koch, NP  ?STOOL SOFTENER 100 MG capsule TAKE TWO  2  CAPSULES BY MOUTH DAILY 01/11/18   [provider]  ?TOVIAZ 8 MG TB24 tablet TAKE 1 TABLET BY MOUTH ONCE DAILY FOR OVERACTIVE BLADDER 04/30/21   Pleas Koch, NP  ?   ? ?Allergies    ?Penicillins   ? ?Review of Systems   ?Review of Systems  ?Unable to perform ROS: Dementia  ?Constitutional:  Positive for fever.  ? ?Physical Exam ?Updated Vital Signs ?BP Marland Kitchen)  147/65   Pulse 91   Temp (!) 102.2 ?F (39 ?C) (Rectal)   Resp 16   Ht '5\' 4"'$  (1.626 m)   Wt 88 kg   SpO2 95%   BMI 33.30 kg/m?  ?Physical Exam ?Vitals and nursing note reviewed.  ?Constitutional:   ?   General: She is not in acute distress. ?   Appearance: Normal appearance. She is well-developed.  ?HENT:  ?   Head: Normocephalic and atraumatic.  ?Eyes:  ?   Conjunctiva/sclera: Conjunctivae normal.  ?Cardiovascular:  ?   Rate and Rhythm: Normal rate and regular rhythm.  ?   Pulses: Normal pulses.  ?   Heart sounds: No murmur heard. ?Pulmonary:  ?   Effort: Pulmonary effort is normal. No respiratory distress.  ?   Breath sounds: Normal breath sounds.  ?Abdominal:  ?   Palpations:  Abdomen is soft.  ?   Tenderness: There is no abdominal tenderness. There is no guarding or rebound.  ?Musculoskeletal:  ?   Cervical back: Neck supple.  ?   Right lower leg: Edema present.  ?Skin: ?   General: Skin is warm and dry.  ?   Capillary Refill: Capillary refill takes less than 2 seconds.  ?Neurological:  ?   General: No focal deficit present.  ?   Mental Status: She is alert.  ? ? ?ED Results / Procedures / Treatments   ?Labs ?(all labs ordered are listed, but only abnormal results are displayed) ?Labs Reviewed  ?COMPREHENSIVE METABOLIC PANEL - Abnormal; Notable for the following components:  ?    Result Value  ? Glucose, Bld 216 (*)   ? Creatinine, Ser 1.17 (*)   ? GFR, Estimated 46 (*)   ? All other components within normal limits  ?CBC WITH DIFFERENTIAL/PLATELET - Abnormal; Notable for the following components:  ? Neutro Abs 8.7 (*)   ? Lymphs Abs 0.5 (*)   ? All other components within normal limits  ?LACTIC ACID, PLASMA - Abnormal; Notable for the following components:  ? Lactic Acid, Venous 2.0 (*)   ? All other components within normal limits  ?PROTIME-INR - Abnormal; Notable for the following components:  ? Prothrombin Time 15.4 (*)   ? All other components within normal limits  ?RESP PANEL BY RT-PCR (FLU A&B, COVID) [id number]  ?CULTURE, BLOOD (ROUTINE X 2)  ?CULTURE, BLOOD (ROUTINE X 2)  ?URINE CULTURE  ?LACTIC ACID, PLASMA  ?APTT  ?URINALYSIS, ROUTINE W REFLEX MICROSCOPIC  ? ? ?EKG ?EKG Interpretation ? ?Date/Time:  Tuesday December 22 2021 21:42:43 EDT ?Ventricular Rate:  85 ?PR Interval:  145 ?QRS Duration: 79 ?QT Interval:  385 ?QTC Calculation: 458 ?R Axis:   52 ?Text Interpretation: Sinus rhythm Borderline T abnormalities, anterior leads No significant change since prior 2/20 Confirmed by Aletta Edouard [phone]) on 12/22/2021 9:45:40 PM ? ?Radiology ?DG Chest 2 View ? ?Result Date: 12/22/2021 ?CLINICAL DATA:  Infection.  Fever. EXAM: CHEST - 2 VIEW COMPARISON:  Chest x-ray 12/03/2018 FINDINGS:  Patchy airspace opacities are seen in the bilateral infrahilar regions. Costophrenic angles are clear. No pneumothorax. Cardiomediastinal silhouette is within normal limits. No acute fractures. IMPRESSION: Patchy infrahilar airspace disease worrisome for infection. Electronically Signed   By: Ronney Asters M.D.   On: 12/22/2021 21:39   ? ?Procedures ?Marland KitchenCritical Care ?Performed by: Hayden Rasmussen, MD ?Authorized by: Hayden Rasmussen, MD  ? ?Critical care provider statement:  ?  Critical care time (minutes):  45 ?  Critical care time was exclusive  of:  Separately billable procedures and treating other patients ?  Critical care was necessary to treat or prevent imminent or life-threatening deterioration of the following conditions:  Sepsis ?  Critical care was time spent personally by me on the following activities:  Development of treatment plan with patient or surrogate, discussions with consultants, evaluation of patient's response to treatment, examination of patient, obtaining history from patient or surrogate, ordering and performing treatments and interventions, ordering and review of laboratory studies, ordering and review of radiographic studies, pulse oximetry, re-evaluation of patient's condition and review of old charts ?  I assumed direction of critical care for this patient from another provider in my specialty: no    ? ? ?Medications Ordered in ED ?Medications  ?lactated ringers bolus 1,000 mL (0 mLs Intravenous Stopped 12/22/21 2345)  ?acetaminophen (TYLENOL) tablet 650 mg (650 mg Oral Given 12/22/21 2136)  ?levofloxacin (LEVAQUIN) IVPB 750 mg (0 mg Intravenous Stopped 12/22/21 2339)  ? ? ?ED Course/ Medical Decision Making/ A&P ?Clinical Course as of 12/22/21 2304  ?Tue Dec 22, 2021  ?2139 2 view chest x-ray interpreted by me as no clear infiltrate.  Awaiting radiology reading. [MB]  ?2149 Radiology calling probable infiltrate perihilar.  Will initiate antibiotics. [MB]  ?2239 Patient's daughter is  here now.  She said the patient has been more slow to respond since yesterday.  Otherwise has not really seen much for cough and no vomiting diarrhea or urinary symptoms.  No recent falls.  She wants her to be a full code.

## 2021-12-22 NOTE — ED Triage Notes (Signed)
Pt arrived via EMS from home. Pt began feeling weak a few hours ago and her temp was 101 per family. Pt is A&Ox4 and denies pain, nausea, vomiting, abdominal pain.  ?

## 2021-12-23 ENCOUNTER — Inpatient Hospital Stay (HOSPITAL_COMMUNITY): Payer: Medicare Other

## 2021-12-23 ENCOUNTER — Other Ambulatory Visit (HOSPITAL_COMMUNITY): Payer: Medicare Other

## 2021-12-23 ENCOUNTER — Encounter (HOSPITAL_COMMUNITY): Payer: Self-pay | Admitting: Family Medicine

## 2021-12-23 DIAGNOSIS — A419 Sepsis, unspecified organism: Secondary | ICD-10-CM

## 2021-12-23 DIAGNOSIS — R509 Fever, unspecified: Secondary | ICD-10-CM

## 2021-12-23 DIAGNOSIS — J189 Pneumonia, unspecified organism: Secondary | ICD-10-CM

## 2021-12-23 DIAGNOSIS — R609 Edema, unspecified: Secondary | ICD-10-CM

## 2021-12-23 DIAGNOSIS — N179 Acute kidney failure, unspecified: Secondary | ICD-10-CM

## 2021-12-23 DIAGNOSIS — F0390 Unspecified dementia without behavioral disturbance: Secondary | ICD-10-CM

## 2021-12-23 HISTORY — DX: Sepsis, unspecified organism: A41.9

## 2021-12-23 LAB — CBC
HCT: 38.5 % (ref 36.0–46.0)
Hemoglobin: 12.2 g/dL (ref 12.0–15.0)
MCH: 30.5 pg (ref 26.0–34.0)
MCHC: 31.7 g/dL (ref 30.0–36.0)
MCV: 96.3 fL (ref 80.0–100.0)
Platelets: 150 10*3/uL (ref 150–400)
RBC: 4 MIL/uL (ref 3.87–5.11)
RDW: 15.1 % (ref 11.5–15.5)
WBC: 8.2 10*3/uL (ref 4.0–10.5)
nRBC: 0 % (ref 0.0–0.2)

## 2021-12-23 LAB — ECHOCARDIOGRAM COMPLETE
AR max vel: 2.15 cm2
AV Area VTI: 2.19 cm2
AV Area mean vel: 2.03 cm2
AV Mean grad: 4 mmHg
AV Peak grad: 7.4 mmHg
Ao pk vel: 1.36 m/s
Area-P 1/2: 4.21 cm2
Height: 64 in
MV VTI: 1.75 cm2
S' Lateral: 2 cm
Single Plane A4C EF: 69 %
Weight: 3044.11 oz

## 2021-12-23 LAB — CBC WITH DIFFERENTIAL/PLATELET
Abs Immature Granulocytes: 0.04 10*3/uL (ref 0.00–0.07)
Basophils Absolute: 0 10*3/uL (ref 0.0–0.1)
Basophils Relative: 0 %
Eosinophils Absolute: 0 10*3/uL (ref 0.0–0.5)
Eosinophils Relative: 0 %
HCT: 40.3 % (ref 36.0–46.0)
Hemoglobin: 12.9 g/dL (ref 12.0–15.0)
Immature Granulocytes: 1 %
Lymphocytes Relative: 11 %
Lymphs Abs: 0.9 10*3/uL (ref 0.7–4.0)
MCH: 30.6 pg (ref 26.0–34.0)
MCHC: 32 g/dL (ref 30.0–36.0)
MCV: 95.7 fL (ref 80.0–100.0)
Monocytes Absolute: 0.8 10*3/uL (ref 0.1–1.0)
Monocytes Relative: 9 %
Neutro Abs: 6.6 10*3/uL (ref 1.7–7.7)
Neutrophils Relative %: 79 %
Platelets: 145 10*3/uL — ABNORMAL LOW (ref 150–400)
RBC: 4.21 MIL/uL (ref 3.87–5.11)
RDW: 15 % (ref 11.5–15.5)
WBC: 8.4 10*3/uL (ref 4.0–10.5)
nRBC: 0 % (ref 0.0–0.2)

## 2021-12-23 LAB — BASIC METABOLIC PANEL
Anion gap: 8 (ref 5–15)
BUN: 13 mg/dL (ref 8–23)
CO2: 26 mmol/L (ref 22–32)
Calcium: 8.7 mg/dL — ABNORMAL LOW (ref 8.9–10.3)
Chloride: 105 mmol/L (ref 98–111)
Creatinine, Ser: 1.01 mg/dL — ABNORMAL HIGH (ref 0.44–1.00)
GFR, Estimated: 55 mL/min — ABNORMAL LOW (ref >60)
Glucose, Bld: 99 mg/dL (ref 70–99)
Potassium: 3.4 mmol/L — ABNORMAL LOW (ref 3.5–5.1)
Sodium: 139 mmol/L (ref 135–145)

## 2021-12-23 LAB — URINALYSIS, ROUTINE W REFLEX MICROSCOPIC
Bilirubin Urine: NEGATIVE
Glucose, UA: NEGATIVE mg/dL
Hgb urine dipstick: NEGATIVE
Ketones, ur: NEGATIVE mg/dL
Leukocytes,Ua: NEGATIVE
Nitrite: NEGATIVE
Protein, ur: NEGATIVE mg/dL
Specific Gravity, Urine: 1.019 (ref 1.005–1.030)
pH: 6 (ref 5.0–8.0)

## 2021-12-23 MED ORDER — SENNOSIDES-DOCUSATE SODIUM 8.6-50 MG PO TABS: 1.0000 | ORAL_TABLET | Freq: Every evening | ORAL | Status: DC | PRN

## 2021-12-23 MED ORDER — LEVOFLOXACIN IN D5W 750 MG/150ML IV SOLN
750.0000 mg | INTRAVENOUS | Status: DC
  Administered 2021-12-24: 750 mg via INTRAVENOUS
  Filled 2021-12-23: qty 150

## 2021-12-23 MED ORDER — ONDANSETRON HCL 4 MG PO TABS: 4.0000 mg | ORAL_TABLET | Freq: Four times a day (QID) | ORAL | Status: DC | PRN

## 2021-12-23 MED ORDER — ATORVASTATIN CALCIUM 40 MG PO TABS
40.0000 mg | ORAL_TABLET | Freq: Every day | ORAL | Status: DC
  Administered 2021-12-23 – 2021-12-28 (×6): 40 mg via ORAL
  Filled 2021-12-23 (×6): qty 1

## 2021-12-23 MED ORDER — DONEPEZIL HCL 10 MG PO TABS
10.0000 mg | ORAL_TABLET | Freq: Every day | ORAL | Status: DC
  Administered 2021-12-23 – 2021-12-27 (×5): 10 mg via ORAL
  Filled 2021-12-23 (×5): qty 1

## 2021-12-23 MED ORDER — ALBUTEROL SULFATE (2.5 MG/3ML) 0.083% IN NEBU
2.5000 mg | INHALATION_SOLUTION | RESPIRATORY_TRACT | Status: DC | PRN
  Administered 2021-12-24: 2.5 mg via RESPIRATORY_TRACT
  Filled 2021-12-23: qty 3

## 2021-12-23 MED ORDER — FESOTERODINE FUMARATE ER 8 MG PO TB24
8.0000 mg | ORAL_TABLET | Freq: Every day | ORAL | Status: DC
  Administered 2021-12-23 – 2021-12-28 (×6): 8 mg via ORAL
  Filled 2021-12-23 (×6): qty 1

## 2021-12-23 MED ORDER — ENOXAPARIN SODIUM 40 MG/0.4ML IJ SOSY
40.0000 mg | PREFILLED_SYRINGE | INTRAMUSCULAR | Status: DC
  Administered 2021-12-23 – 2021-12-28 (×6): 40 mg via SUBCUTANEOUS
  Filled 2021-12-23 (×6): qty 0.4

## 2021-12-23 MED ORDER — ONDANSETRON HCL 4 MG/2ML IJ SOLN
4.0000 mg | Freq: Four times a day (QID) | INTRAMUSCULAR | Status: DC | PRN
Start: 2021-12-23 — End: 2021-12-28

## 2021-12-23 MED ORDER — PERFLUTREN LIPID MICROSPHERE
1.0000 mL | INTRAVENOUS | Status: AC | PRN
  Administered 2021-12-23: 1 mL via INTRAVENOUS

## 2021-12-23 MED ORDER — LISINOPRIL 5 MG PO TABS
5.0000 mg | ORAL_TABLET | Freq: Every day | ORAL | Status: DC
  Administered 2021-12-23 – 2021-12-28 (×6): 5 mg via ORAL
  Filled 2021-12-23 (×6): qty 1

## 2021-12-23 MED ORDER — ACETAMINOPHEN 650 MG RE SUPP: 650.0000 mg | Freq: Four times a day (QID) | RECTAL | Status: DC | PRN

## 2021-12-23 MED ORDER — ASPIRIN EC 81 MG PO TBEC
81.0000 mg | DELAYED_RELEASE_TABLET | Freq: Every day | ORAL | Status: DC
  Administered 2021-12-23 – 2021-12-28 (×6): 81 mg via ORAL
  Filled 2021-12-23 (×6): qty 1

## 2021-12-23 MED ORDER — ACETAMINOPHEN 325 MG PO TABS
650.0000 mg | ORAL_TABLET | Freq: Four times a day (QID) | ORAL | Status: DC | PRN
  Filled 2021-12-23: qty 2

## 2021-12-23 MED ORDER — LACTATED RINGERS IV SOLN: INTRAVENOUS | Status: DC

## 2021-12-23 MED ORDER — MEMANTINE HCL ER 28 MG PO CP24
28.0000 mg | ORAL_CAPSULE | Freq: Every day | ORAL | Status: DC
  Administered 2021-12-23 – 2021-12-28 (×6): 28 mg via ORAL
  Filled 2021-12-23 (×6): qty 1

## 2021-12-23 MED ORDER — LATANOPROST 0.005 % OP SOLN
1.0000 [drp] | Freq: Every day | OPHTHALMIC | Status: DC
  Administered 2021-12-23 – 2021-12-26 (×4): 1 [drp] via OPHTHALMIC
  Filled 2021-12-23: qty 2.5

## 2021-12-23 NOTE — Plan of Care (Signed)
Initiated [facility] Care plan 

## 2021-12-23 NOTE — Assessment & Plan Note (Signed)
Continue lisinopril and monitor BP ?

## 2021-12-23 NOTE — Evaluation (Signed)
Physical Therapy Evaluation ?Patient Details ?Name: Caroline French ?MRN: [id number] ?DOB: 1935/04/25 ?Today's Date: 12/23/2021 ? ?History of Present Illness ? Caroline French is a 87 y.o. female with medical history significant of dementia, hypertension history of breast cancer, history of diverticulitis, overactive bladder who presents by EMS for fever,Patient is found to have pneumonia on chest x-ray  ?Clinical Impression ? The patient presents with significant change in prior level of function. Patient required mod assist of 2 persons to  stand and much cueing to take  shuffle steps  to get to recliner. Patient with noted jerking of trunk  and  extremities. ? Patient ambulatory in her home with no device and independent toileting and negotiated 14 steps several times /day. ? Daughter present and hopeful that patient will  progress to return  ambulatory status to return home. Pt admitted with above diagnosis. ? Pt currently with functional limitations due to the deficits listed below (see PT Problem List). Pt will benefit from skilled PT to increase their independence and safety with mobility to allow discharge to the venue listed below.   ? ?   ? ?Recommendations for follow up therapy are one component of a multi-disciplinary discharge planning process, led by the attending physician.  Recommendations may be updated based on patient status, additional functional criteria and insurance authorization. ? ?Follow Up Recommendations Home health PT ? ?  ?Assistance Recommended at Discharge Frequent or constant Supervision/Assistance  ?Patient can return home with the following ? Two people to help with walking and/or transfers;Assistance with cooking/housework;Assist for transportation;Help with stairs or ramp for entrance;A lot of help with bathing/dressing/bathroom ? ?  ?Equipment Recommendations None recommended by PT  ?Recommendations for Other Services ?   OT ?  ?Functional Status Assessment Patient has had a recent decline in  their functional status and demonstrates the ability to make significant improvements in function in a reasonable and predictable amount of time.  ? ?  ?Precautions / Restrictions Precautions ?Precautions: Fall  ? ?  ? ?Mobility ? Bed Mobility ?Overal bed mobility: Needs Assistance ?Bed Mobility: Supine to Sit ?  ?  ?Supine to sit: Mod assist, +2 for safety/equipment, +2 for physical assistance, HOB elevated ?  ?  ?General bed mobility comments: assist with legs  to move the legs over bed edge. Assist with trunk  to sitting up and to scoot to bed edge. ?  ? ?Transfers ?Overall transfer level: Needs assistance ?Equipment used: Rolling walker (2 wheels) ?Transfers: Sit to/from Stand, Bed to chair/wheelchair/BSC ?Sit to Stand: Max assist, From elevated surface, +2 physical assistance, +2 safety/equipment ?  ?  ?  ?  ?  ?General transfer comment: assist to rise from the  bed, multimodal cues  for hand placement , assist to power up, much cueing to step to recliner , wide base, very unsteady and jerking  movements of the legs and trunk ?  ? ?Ambulation/Gait ?  ?  ?  ?  ?  ?  ?  ?  ? ?Stairs ?  ?  ?  ?  ?  ? ?Wheelchair Mobility ?  ? ?Modified Rankin (Stroke Patients Only) ?  ? ?  ? ?Balance Overall balance assessment: Needs assistance ?Sitting-balance support: Bilateral upper extremity supported, Feet supported ?Sitting balance-Leahy Scale: Poor ?Sitting balance - Comments: posterior bias, ?  ?Standing balance support: During functional activity, Bilateral upper extremity supported, Reliant on assistive device for balance ?Standing balance-Leahy Scale: Poor ?  ?  ?  ?  ?  ?  ?  ?  ?  ?  ?  ?  ?   ? ? ? ?  Pertinent Vitals/Pain Pain Assessment ?Pain Assessment: No/denies pain  ? ? ?Home Living Family/patient expects to be discharged to:: Private residence ?Living Arrangements: Children ?Available Help at Discharge: Family;Personal care attendant;Available 24 hours/day ?Type of Home: House ?Home Access: Stairs to  enter ?Entrance Stairs-Rails: Right ?Entrance Stairs-Number of Steps: 4 ?Alternate Level Stairs-Number of Steps: 14 ?Home Layout: Two level;Bed/bath upstairs ?Home Equipment: None ?Additional Comments: [facility] x's [facility], has a PCA for 1 hour a few days aweek_Info from daughter  ?  ?Prior Function Prior Level of Function : Needs assist ?  ?  ?  ?  ?  ?  ?Mobility Comments: independent ambulator in home, able to negotiate steps to upstairs 4-5 times /day, ?ADLs Comments: independent in dressing and toileting during day. ?  ? ? ?Hand Dominance  ?   ? ?  ?Extremity/Trunk Assessment  ? Upper Extremity Assessment ?Upper Extremity Assessment: Defer to OT evaluation ?  ? ?Lower Extremity Assessment ?Lower Extremity Assessment: Generalized weakness (noted tremors and jerking  when attempts to move the legs) ?  ? ?Cervical / Trunk Assessment ?Cervical / Trunk Assessment: Other exceptions ?Cervical / Trunk Exceptions: note jerking of the trunk when  moving to sitting on the bed edge  ?Communication  ? Communication: Expressive difficulties  ?Cognition Arousal/Alertness: Awake/alert ?Behavior During Therapy: [facility] for tasks assessed/performed ?Overall Cognitive Status: History of cognitive impairments - at baseline ?  ?  ?  ?  ?  ?  ?  ?  ?  ?  ?  ?  ?  ?  ?  ?  ?  ?  ?  ? ?  ?General Comments   ? ?  ?Exercises    ? ?Assessment/Plan  ?  ?PT Assessment Patient needs continued PT services  ?PT Problem List Decreased strength;Decreased mobility;Decreased safety awareness;Decreased knowledge of precautions;Decreased activity tolerance;Decreased balance;Decreased cognition;Decreased knowledge of use of DME ? ?   ?  ?PT Treatment Interventions DME instruction;Therapeutic activities;Gait training;Therapeutic exercise;Patient/family education;Functional mobility training;Balance training   ? ?PT Goals (Current goals can be found in the Care Plan section)  ?Acute Rehab PT Goals ?Patient Stated Goal: per daughter , return  home if she is able  to return to prior level ?PT Goal Formulation: With family ?Time For Goal Achievement: 01/06/22 ?Potential to Achieve Goals: Fair ? ?  ?Frequency Min 3X/week ?  ? ? ?Co-evaluation   ?  ?  ?  ?  ? ? ?  ?AM-PAC PT "6 Clicks" Mobility  ?Outcome Measure Help needed turning from your back to your side while in a flat bed without using bedrails?: A Lot ?Help needed moving from lying on your back to sitting on the side of a flat bed without using bedrails?: A Lot ?Help needed moving to and from a bed to a chair (including a wheelchair)?: Total ?Help needed standing up from a chair using your arms (e.g., wheelchair or bedside chair)?: Total ?Help needed to walk in hospital room?: Total ?Help needed climbing 3-5 steps with a railing? : Total ?6 Click Score: 8 ? ?  ?End of Session Equipment Utilized During Treatment: Gait belt ?Activity Tolerance: Patient tolerated treatment well ?Patient left: in chair;with call bell/phone within reach;with chair alarm set;with family/visitor present ?Nurse Communication: Mobility status ?PT Visit Diagnosis: Unsteadiness on feet (R26.81) ?  ? ?Time: 8416-6063 ?PT Time Calculation (min) (ACUTE ONLY): 28 min ? ? ?Charges:   PT Evaluation ?$PT Eval Low Complexity: 1 Low ?PT Treatments ?$Therapeutic Activity: 8-22 mins ?  ?   ? ? ?  Tresa Endo PT ?Acute Rehabilitation Services ?Pager [phone] ?Office [phone] ? ? ?Romar Woodrick, Shella Maxim ?12/23/2021, 4:39 PM ? ?

## 2021-12-23 NOTE — Progress Notes (Signed)
End of shift ? ?Pt is a 2 assist while ambulating.  Pt slept a good portion of the day.  She needs some assistance with meals.   ? ?Daughter brought home cpap machine.  Ultrasound for DVT on legs performed today, Echo performed. Blood cultures in progress. ?

## 2021-12-23 NOTE — Evaluation (Signed)
Clinical/Bedside Swallow Evaluation ?Patient Details  ?Name: Caroline French ?MRN: [id number] ?Date of Birth: May 25, 1935 ? ?Today's Date: 12/23/2021 ?Time: SLP Start Time (ACUTE ONLY): 2197 SLP Stop Time (ACUTE ONLY): 1230 ?SLP Time Calculation (min) (ACUTE ONLY): 35 min ? ?Past Medical History:  ?Past Medical History:  ?Diagnosis Date  ? Breast cancer ([place]) 1992's  ? left breast  ? Dementia with behavioral disturbance   ? Diverticulitis   ? Essential hypertension   ? Glaucoma   ? Hallucinations   ? Overactive bladder   ? ?Past Surgical History:  ?Past Surgical History:  ?Procedure Laterality Date  ? ABDOMINAL HYSTERECTOMY    ? BREAST BIOPSY    ? BREAST LUMPECTOMY Left 1990's  ? CATARACT EXTRACTION, BILATERAL  08/012020  ? ?HPI:  ?87yo female admitted 12/22/21 with fever and intermittent dry cough. PMH: dementia, HTN, breast cancer, diverticulitis, overactive bladder, chronic RLE swelling. CXR = patchy infrahilar airspace disease worrisome for infection  ?  ?Assessment / Plan / Recommendation  ?Clinical Impression ? Pt seen at bedside for assessment of swallow function and safety. Pt was awake and alert, pleasant and cooperative. RN present initially. Pt presents with upper and lower dentures. CN exam unremarkable. She reports no difficulty swallowing. Pt accepted trials of thin liquid, puree, and solid textures. Pt passed the 3oz water challenge, indicating low risk of aspiration. Pt tolerated all consistencies without obvious oral issues or overt s/s aspiration. Recommend Dys3 (mechanical soft/chopped meats) and thin liquids. Pt will benefit from assistance with set up. Safe swallow precautions posted at [facility]. RN and MD aware. SLP will follow to assess diet tolerance and continue education. ?SLP Visit Diagnosis: Dysphagia, unspecified (R13.10) ?   ?Aspiration Risk ? Mild aspiration risk  ?  ?Diet Recommendation Dysphagia 3 (Mech soft);Thin liquid  ? ?Liquid Administration via: Cup;Straw ?Medication Administration: Whole  meds with liquid ?Supervision: Patient able to self feed;Staff to assist with self feeding;Intermittent supervision to cue for compensatory strategies ?Compensations: Minimize environmental distractions;Slow rate;Small sips/bites ?Postural Changes: Seated upright at 90 degrees  ?  ?Other  Recommendations Oral Care Recommendations: Oral care BID   ? ?Recommendations for follow up therapy are one component of a multi-disciplinary discharge planning process, led by the attending physician.  Recommendations may be updated based on patient status, additional functional criteria and insurance authorization. ? ?Follow up Recommendations Other (comment) (TBD)  ? ? ?  ?Assistance Recommended at Discharge Frequent or constant Supervision/Assistance  ?Functional Status Assessment Patient has not had a recent decline in their functional status  ?Frequency and Duration min 1 x/week  ?2 weeks;1 week ?  ?   ? ?Prognosis Prognosis for Safe Diet Advancement: Fair ?Barriers to Reach Goals: Cognitive deficits  ? ?  ? ?Swallow Study   ?General Date of Onset: 12/22/21 ?HPI: 87yo female admitted 12/22/21 with fever and intermittent dry cough. PMH: dementia, HTN, breast cancer, diverticulitis, overactive bladder, chronic RLE swelling. CXR = patchy infrahilar airspace disease worrisome for infection ?Type of Study: Bedside Swallow Evaluation ?Previous Swallow Assessment: none ?Diet Prior to this Study: Regular ?Temperature Spikes Noted: No ?Respiratory Status: Room air ?History of Recent Intubation: No ?Behavior/Cognition: Alert;Cooperative;Pleasant mood ?Oral Cavity Assessment: Within Functional Limits ?Oral Care Completed by SLP: No ?Oral Cavity - Dentition: Dentures, top;Dentures, bottom ?Vision: Functional for self-feeding ?Self-Feeding Abilities: Able to feed self;Needs assist;Needs set up ?Patient Positioning: Upright in bed ?Baseline Vocal Quality: Normal ?Volitional Cough: Strong ?Volitional Swallow: Able to elicit  ?   ?Oral/Motor/Sensory Function Overall Oral Motor/Sensory Function: Within functional  limits   ?Ice Chips Ice chips: Not tested   ?Thin Liquid Thin Liquid: Within functional limits ?Presentation: Straw ?Other Comments: pt passed 3oz water challenge  ?  ?Nectar Thick Nectar Thick Liquid: Not tested   ?Honey Thick Honey Thick Liquid: Not tested   ?Puree Puree: Within functional limits ?Presentation: Spoon   ?Solid ? ? ?  Solid: Within functional limits ?Presentation: Spoon  ? ?  ?Caroline French B. Caroline French, MSP, CCC-SLP ?Speech Language Pathologist ?Office: [phone] ? ?Caroline French ?12/23/2021,12:38 PM ? ? ? ?

## 2021-12-23 NOTE — H&P (Signed)
?History and Physical  ? ? ?Patient: Caroline French ZYS:[id number] DOB: 05/05/1935 ?DOA: 12/22/2021 ?DOS: the patient was seen and examined on 12/23/2021 ?PCP: Pleas Koch, NP  ?Patient coming from: Home ? ?Chief Complaint:  ?Chief Complaint  ?Patient presents with  ? Fever  ? ?HPI: Caroline French is a 87 y.o. female with medical history significant of dementia, hypertension history of breast cancer, history of diverticulitis, overactive bladder who presents by EMS for fever.  Patient is unable to give any history.  History is obtained from the ER doctor who obtained from the daughter.  Daughter is no longer at bedside and there is no answer when attempted to call.  She reports that she developed a fever yesterday.  She has an intermittent dry cough for the last days.  She has a chronically swollen right leg.  There is no recent trauma or fall.  No syncope.  No report of any vomiting or diarrhea. ?Patient is found to have pneumonia on chest x-ray.  Had fever and tachypnea.  Was given antibiotic therapy with Levaquin due to her reported penicillin allergy from the daughter to the ER physician. ? ?Review of Systems: unable to review all systems due to the inability of the patient to answer questions. ?Past Medical History:  ?Diagnosis Date  ? Breast cancer ([place]) 1992's  ? left breast  ? Dementia with behavioral disturbance   ? Diverticulitis   ? Essential hypertension   ? Glaucoma   ? Hallucinations   ? Overactive bladder   ? ?Past Surgical History:  ?Procedure Laterality Date  ? ABDOMINAL HYSTERECTOMY    ? BREAST BIOPSY    ? BREAST LUMPECTOMY Left 1990's  ? CATARACT EXTRACTION, BILATERAL  08/012020  ? ?Social History:  reports that she has never smoked. She has never used smokeless tobacco. She reports that she does not drink alcohol and does not use drugs. ? ?Allergies  ?Allergen Reactions  ? Penicillins Rash  ?  Did it involve swelling of the face/tongue/throat, SOB, or low BP? Yes ?Did it involve sudden or severe  rash/hives, skin peeling, or any reaction on the inside of your mouth or nose? Unk ?Did you need to seek medical attention at a hospital or doctor's office? Unk ?When did it last happen? "I was young" ?If all above answers are ?NO?, may proceed with cephalosporin use. ?  ? ? ?History reviewed. No pertinent family history. ? ?Prior to Admission medications   ?Medication Sig Start Date End Date Taking? Authorizing Provider  ?aspirin (ASPIRIN LOW DOSE) 81 MG EC tablet TAKE 1 TABLET BY MOUTH ONCE DAILY 10/14/21   Pleas Koch, NP  ?atorvastatin (LIPITOR) 40 MG tablet Take 1 tablet (40 mg total) by mouth daily. For cholesterol. 10/14/21   Pleas Koch, NP  ?CALCIUM CITRATE + D 315-200 MG-UNIT tablet Take 1 tablet by mouth daily. 01/11/18   [provider]  ?donepezil (ARICEPT) 10 MG tablet Take 1 tablet (10 mg total) by mouth at bedtime. For memory. 01/30/19   Pleas Koch, NP  ?fluticasone (FLONASE) 50 MCG/ACT nasal spray INSTILL 1 SPRAY IN EACH NOSTRIL TWICE DAILY AS NEEDED FOR ALLERGIES 04/30/21   Pleas Koch, NP  ?hydrOXYzine (ATARAX) 10 MG tablet TAKE 1-2 TABLETS BY MOUTH 30-60 MINUTES PRIOR TO TRAVEL AS NEEDED FOR ANXIETY. 09/23/21   Pleas Koch, NP  ?latanoprost (XALATAN) 0.005 % ophthalmic solution INSTILL 1 DROP TO BOTH EYES EVERY EVENING AT BEDTIME 04/20/17   [provider]  ?lisinopril (ZESTRIL)  5 MG tablet TAKE 1 TABLET BY MOUTH EVERY MORNING FOR BLOOD PRESSURE 04/30/21   Pleas Koch, NP  ?memantine (NAMENDA XR) 28 MG CP24 24 hr capsule TAKE ONE (1) CAPSULE BY MOUTH ONCE DAILY FOR MEMORY 06/18/20   Pleas Koch, NP  ?Multiple Vitamin (MULTI-VITAMINS) TABS Take 1 tablet by mouth daily. 01/11/18   [provider]  ?Omega-3 Fatty Acids (SEA-OMEGA 30) 1200 MG CAPS Take 1 capsule by mouth daily. 01/11/18   [provider]  ?omeprazole (PRILOSEC) 20 MG capsule TAKE 1 CAPSULE BY MOUTH EVERY DAY FOR HEARTBURN 04/01/21   Pleas Koch, NP   ?risperiDONE (RISPERDAL) 0.5 MG tablet Take 1 tablet by mouth at bedtime. 02/09/21   [provider]  ?sertraline (ZOLOFT) 25 MG tablet TAKE 1 TABLET BY MOUTH EVERY DAY FOR ANXIETY ALONG WITH '50MG'$  DOSE 06/18/21   Pleas Koch, NP  ?sertraline (ZOLOFT) 50 MG tablet Take 1 tablet (50 mg total) by mouth daily. For anxiety. Take with 25 mg tablet. 04/30/21   Pleas Koch, NP  ?STOOL SOFTENER 100 MG capsule TAKE TWO  2  CAPSULES BY MOUTH DAILY 01/11/18   [provider]  ?TOVIAZ 8 MG TB24 tablet TAKE 1 TABLET BY MOUTH ONCE DAILY FOR OVERACTIVE BLADDER 04/30/21   Pleas Koch, NP  ? ? ?Physical Exam: ?Vitals:  ? 12/22/21 2314 12/22/21 2330 12/23/21 0033 12/23/21 0036  ?BP:  (!) 158/65  (!) 148/58  ?Pulse:  84  85  ?Resp:  (!) 27  20  ?Temp: 98.8 ?F (37.1 ?C)   (!) 100.5 ?F (38.1 ?C)  ?TempSrc: Oral   Oral  ?SpO2:  97%  97%  ?Weight:   86.3 kg   ?Height:   '5\' 4"'$  (1.626 m)   ? ?General: WDWN, Alert and oriented to self.  ?Eyes: EOMI, PERRL, conjunctivae normal.  Sclera nonicteric ?HENT:  [place]/AT, external ears normal.  Nares patent without epistasis.  Mucous membranes are dry ?Neck: Soft, normal range of motion, supple, no masses, no thyromegaly.  Trachea midline ?Respiratory: Equal breath sounds but mildly diminished and shallow inspiration effort. no wheezing, no crackles. Normal respiratory effort. ?Cardiovascular: Regular rate and rhythm, no murmurs / rubs / gallops. No extremity edema. 2+ pedal pulses.  ?Abdomen: Soft, no tenderness, nondistended, no rebound or guarding.  No masses palpated. Bowel sounds normoactive ?Musculoskeletal: FROM. no cyanosis. Normal muscle tone.  ?Skin: Warm, dry, intact no rashes, lesions, ulcers. No induration ?Neurologic: Moves all 4 extremities spontaneously. Normal speech. patella DTR +1 bilaterally. Strength 5/5 in all extremities.   ?Psychiatric: Normal mood.  ? ?Data Reviewed: ?Lab work: [employer] is unremarkable.  Lactic acid 2.0 initially.  Repeat lactic acid  1.7.  Sodium 139 potassium 3.7 chloride 104 bicarb 26 creatinine 1.17 BUN 14 glucose 216 LFTs normal COVID-negative.  Influenza A and B negative ? ?Chest x-ray shows patchy infrahilar opacities bilaterally concerning for infection. ? ?CT head without contrast shows no acute intracranial pathology or hemorrhage.  Age-related atrophy and chronic microvascular ischemic changes noted ? ?EKG shows normal sinus rhythm with nonspecific ST changes leads.  No ST elevation.  QTc 458 ? ?Assessment and Plan: ?* CAP (community acquired pneumonia) ?Ms. Tugwell is admitted to Telemetry.  ?Was given Levaquin in ER as has allergy to PCN that is severe per report daughter gave to ER doctor. Daughter is now gone.  ?Will discuss with pharmacy.  Pt will not need antibiotic dose for 48 hours based on renal function giving time for  dayshift attending to address with daughter the true allergy and follow up on cultures from ER.  ?Oxygen as needed to keep sat 92-96%.  ? ?AKI (acute kidney injury) ([place]) ?IVF hydration with LR. Monitor renal function.  ? ?Sepsis ([place]) ?Meets criteria with fever, tachypnea, pneumonia.  ?Lactic acid was 2 but decreased after IVF hydration.  ?IVF hydration.  ? ?Essential hypertension ?Continue lisinopril and monitor BP ? ?Dementia ([place]) ?Continue aricept and namenda ? ?Advance Care Planning:   Code Status:  Full Code.  Lovenox for DVT prophylaxis. ? ?Family Communication: No family at bedside. No answer when called ? ?Author: ?Eben Burow, MD ?12/23/2021 1:58 AM ? ?For on call review [email or web address].  ?

## 2021-12-23 NOTE — TOC Initial Note (Signed)
Transition of Care (TOC) - Initial/Assessment Note  ? ? ?Patient Details  ?Name: Caroline French ?MRN: [id number] ?Date of Birth: 03-31-1935 ? ?Transition of Care (TOC) CM/SW Contact:    ?Tawanna Cooler, RN ?Phone Number: ?12/23/2021, 1:18 PM ? ?Clinical Narrative:                 ? ?Patient has dementia, lives with daughter.  PT eval pending.  TOC following for potential discharge needs.  ? ? ?Expected Discharge Plan: [place] ?Barriers to Discharge: Continued Medical Work up ? ? ? ?Expected Discharge Plan and Services ?Expected Discharge Plan: [place] ?  ?  ?  ?Living arrangements for the past 2 months: Single Family Home ?                ?  ?  ?Prior Living Arrangements/Services ?Living arrangements for the past 2 months: [place] ?Lives with:: Adult Children ?Patient language and need for interpreter reviewed:: Yes ?       ?Need for Family Participation in Patient Care: Yes (Comment) ?Care giver support system in place?: Yes (comment) ?  ?Criminal Activity/Legal Involvement Pertinent to Current Situation/Hospitalization: No - Comment as needed ? ? ?  ?Alcohol / Substance Use: Not Applicable ?Psych Involvement: No (comment) ? ?Admission diagnosis:  CAP (community acquired pneumonia) [J18.9] ?Altered mental status, unspecified altered mental status type [R41.82] ?Community acquired pneumonia, unspecified laterality [J18.9] ?Patient Active Problem List  ? Diagnosis Date Noted  ? Sepsis ([place]) 12/23/2021  ? Dementia ([place]) 12/23/2021  ? AKI (acute kidney injury) ([place]) 12/23/2021  ? CAP (community acquired pneumonia) 12/22/2021  ? Urgency of urination 09/23/2020  ? Gastroesophageal reflux disease 01/29/2020  ? Altered mental status 09/11/2019  ? Sleep apnea 10/19/2018  ? Prediabetes 07/21/2018  ? Hyperlipidemia 01/13/2018  ? Dementia with behavioral disturbance 01/13/2018  ? Overactive bladder 01/13/2018  ? Glaucoma 01/13/2018  ? Essential hypertension 01/13/2018  ? ?PCP:   Pleas Koch, NP ?Pharmacy:   ?[place], [place] ?[place] ?[facility] [place] [address] ?Phone: [phone] Fax: [phone] ? ?[id number] [place], [place] - [id number]?Phone: [phone]: [phone]? ? ? ?Readmission Risk Interventions ?No flowsheet data found. ? ? ?

## 2021-12-23 NOTE — Assessment & Plan Note (Signed)
Continue aricept and namenda.   

## 2021-12-23 NOTE — Assessment & Plan Note (Signed)
IVF hydration with LR. Monitor renal function.  ?

## 2021-12-23 NOTE — Progress Notes (Signed)
BLE venous duplex has been completed.   ? ?Results can be found under chart review under CV PROC. ?12/23/2021 2:23 PM ?Dondre Catalfamo RVT, RDMS ? ?

## 2021-12-23 NOTE — Assessment & Plan Note (Signed)
Meets criteria with fever, tachypnea, pneumonia.  ?Lactic acid was 2 but decreased after IVF hydration.  ?IVF hydration.  ?

## 2021-12-23 NOTE — Progress Notes (Signed)
Rt gave pt flutter valve per MD order. Pt can not do at this time. Pt has weak blow into the device.  ?

## 2021-12-23 NOTE — Assessment & Plan Note (Addendum)
[name] is admitted to Telemetry.  ?Was given Levaquin in ER as has allergy to PCN that is severe per report daughter gave to ER doctor. Daughter is now gone.  ?Will discuss with pharmacy.  Pt will not need antibiotic dose for 48 hours based on renal function giving time for dayshift attending to address with daughter the true allergy and follow up on cultures from ER.  ?Oxygen as needed to keep sat 92-96%.  ?

## 2021-12-23 NOTE — Progress Notes (Addendum)
Pt arrived to room from ED via stretcher, answered orientation questions appropriately however has underlying dementia and is a poor historian, asked about bump on left side of forehead.  Unable to complete admission questions will attempt with daughter in the morning ? VS stable , temp 100.5 orally will give tylenol when appropriate.  ?Lateral transfer to the bed, new gown put on, purewick to suction to collect urine sample.  1L LR given in ED@ ~2140 Will bladder scan if pt does not have significant urine output by 0500.  Pt with some stress incontinence dribbles with cough ?

## 2021-12-23 NOTE — Plan of Care (Signed)

## 2021-12-23 NOTE — Progress Notes (Addendum)
Patient is admitted earlier this am, details please refer to hpi ?Fever appears have come done, she does not appear  ?she has dementia, she is oriented to person, she knows she is in [place], but calm and cooperative, she denies pain, she has bilateral lower extremity pitting edema, daughter reports it is chronic, but recently they having hard time to put compression stocking on her ?Will get venous doppler and echocardiogram ?Daughter reports patient going to memory care day program ,not sure is any sick contact there ?Will continue levaquin for now since unknown reaction to pcn ?[place], continue home cpap ?

## 2021-12-24 DIAGNOSIS — E876 Hypokalemia: Secondary | ICD-10-CM | POA: Diagnosis not present

## 2021-12-24 DIAGNOSIS — F0150 Vascular dementia without behavioral disturbance: Secondary | ICD-10-CM | POA: Diagnosis not present

## 2021-12-24 DIAGNOSIS — R627 Adult failure to thrive: Secondary | ICD-10-CM | POA: Diagnosis not present

## 2021-12-24 DIAGNOSIS — J189 Pneumonia, unspecified organism: Secondary | ICD-10-CM | POA: Diagnosis not present

## 2021-12-24 LAB — BASIC METABOLIC PANEL
Anion gap: 7 (ref 5–15)
BUN: 11 mg/dL (ref 8–23)
CO2: 27 mmol/L (ref 22–32)
Calcium: 8.7 mg/dL — ABNORMAL LOW (ref 8.9–10.3)
Chloride: 101 mmol/L (ref 98–111)
Creatinine, Ser: 1.1 mg/dL — ABNORMAL HIGH (ref 0.44–1.00)
GFR, Estimated: 50 mL/min — ABNORMAL LOW (ref >60)
Glucose, Bld: 101 mg/dL — ABNORMAL HIGH (ref 70–99)
Potassium: 3.3 mmol/L — ABNORMAL LOW (ref 3.5–5.1)
Sodium: 135 mmol/L (ref 135–145)

## 2021-12-24 LAB — URINE CULTURE

## 2021-12-24 LAB — MRSA NEXT GEN BY PCR, NASAL: MRSA by PCR Next Gen: NOT DETECTED

## 2021-12-24 LAB — MAGNESIUM: Magnesium: 1.8 mg/dL (ref 1.7–2.4)

## 2021-12-24 LAB — PROCALCITONIN: Procalcitonin: 0.11 ng/mL

## 2021-12-24 MED ORDER — MAGNESIUM SULFATE 2 GM/50ML IV SOLN
2.0000 g | Freq: Once | INTRAVENOUS | Status: AC
  Administered 2021-12-24: 2 g via INTRAVENOUS
  Filled 2021-12-24: qty 50

## 2021-12-24 MED ORDER — FERROUS SULFATE 325 (65 FE) MG PO TABS
325.0000 mg | ORAL_TABLET | Freq: Every day | ORAL | Status: DC
  Administered 2021-12-25 – 2021-12-28 (×4): 325 mg via ORAL
  Filled 2021-12-24 (×4): qty 1

## 2021-12-24 MED ORDER — POTASSIUM CHLORIDE 20 MEQ PO PACK
40.0000 meq | PACK | Freq: Once | ORAL | Status: AC
  Administered 2021-12-24: 40 meq via ORAL
  Filled 2021-12-24: qty 2

## 2021-12-24 MED ORDER — RISPERIDONE 0.25 MG PO TABS
0.5000 mg | ORAL_TABLET | Freq: Every day | ORAL | Status: DC
  Administered 2021-12-24 – 2021-12-27 (×4): 0.5 mg via ORAL
  Filled 2021-12-24 (×2): qty 1
  Filled 2021-12-24: qty 2
  Filled 2021-12-24: qty 1

## 2021-12-24 MED ORDER — OMEGA-3-ACID ETHYL ESTERS 1 G PO CAPS
1.0000 | ORAL_CAPSULE | Freq: Every day | ORAL | Status: DC
  Administered 2021-12-25 – 2021-12-28 (×3): 1 g via ORAL
  Filled 2021-12-24 (×4): qty 1

## 2021-12-24 MED ORDER — OYSTER SHELL CALCIUM/D3 500-5 MG-MCG PO TABS
1.0000 | ORAL_TABLET | Freq: Every day | ORAL | Status: DC
  Administered 2021-12-24 – 2021-12-28 (×5): 1 via ORAL
  Filled 2021-12-24 (×5): qty 1

## 2021-12-24 NOTE — TOC Progression Note (Addendum)
Transition of Care (TOC) - Progression Note  ? ? ?Patient Details  ?Name: Caroline French ?MRN: [id number] ?Date of Birth: 13-Aug-1935 ? ?Transition of Care (TOC) CM/SW Contact  ?Tawanna Cooler, RN ?Phone Number: ?12/24/2021, 10:19 AM ? ?Clinical Narrative:    ? ?PT recommends [facility].  Called patient's daughter, [name], at [phone].  Patient lives with [name].  Receives personal care services with [facility] a few times a week.   ?[name] has no preference for a home health care provider, patient has always been independent and hasn't had home health in the past.  Patient doesn't have a walker at home, [name] is unsure if patient will need one. ? ?TOC CM reached out to [place] at [facility]. He confirmed availability in patient's area and accepted patient for services.   ? ?CM called [name] back to let her know the [place] will be [place].  Asked [name] about any equipment needs since PT didn't recommend anything.  [name] states patient normally very independent, doesn't have a walker.  States she will probably just go get one from a pharmacy if patient needs one.  TOC continuing to follow.  ? ?Addendum: Received a message from nursing that OT is recommending SNF.  Nursing aware that TOC will need an updated PT note also recommending SNF in order to send a referral.    ?Spoke with daughter, [name], about the OT recommendation for SNF.  Let her know that if PT changes recommendation to SNF, we can send out a SNF referral and give her bed offers. She verbalized understanding and agreement.   ? ? ?Expected Discharge Plan: [place] ?Barriers to Discharge: Continued Medical Work up ? ?Expected Discharge Plan and Services ?Expected Discharge Plan: [place] ?  ?  ?  ?Living arrangements for the past 2 months: Single Family Home ?                ?   ? ?Readmission Risk Interventions ?No flowsheet data found. ? ?

## 2021-12-24 NOTE — Progress Notes (Addendum)
?PROGRESS NOTE ? ? ? ?Caroline French  GQQ:[id number] DOB: October 04, 1935 DOA: 12/22/2021 ?PCP: Pleas Koch, NP  ? ? ? ?Brief Narrative:  ? ?Caroline French is a 87 y.o. female with medical history significant of dementia, hypertension history of breast cancer, history of diverticulitis, overactive bladder who presents by EMS for fever.  Patient is unable to give any history ? ?Subjective: ? ?She is sitting up in chair, appear stronger than yesterday, per RN , she still needs two assists though ?She is calm and pleasantly confused, she knows she is in [place], not oriented to time ? ?She denies pain or sob, I heard cough x1 when I ask her to take deep breath, cough is weak, nonproductive  ? ?Fever resolved, tmax 99.3 ? ?Assessment & Plan: ? Principal Problem: ?  CAP (community acquired pneumonia) ?Active Problems: ?  Sepsis ([place]) ?  AKI (acute kidney injury) ([place]) ?  Essential hypertension ?  Dementia ([place]) ? ? ? ?Assessment and Plan: ? ?* CAP (community acquired pneumonia) ?Patient presented with fever, tachypnea,  ?Chest x-ray with patchy infrahilar airspace disease worrisome for infection ?Blood culture no growth, MRSA screen negative, flu/COVID-19 screen negative ?Concern for aspiration pneumonia ?Seen by speech, currently on soft diet, aspiration precaution ?Improving on Levaquin ( pcn allergy), continue ? ?Hypokalemia ?Replace K ?  ?CKDIIIa ?Cr appear close to baseline  ? ? ?Essential hypertension/HLD ?Continue lisinopril and  statin ? ?Bilateral lower extremity edema ?Chronic ?Venous Doppler no DVT ?Echocardiogram with preserved LVEF ,grade 1 diastolic dysfunction ?Elevate legs/compression stocking ? ?Dementia ([place]) ?Continue aricept and namenda ?Also takes Risperdal nightly which daughter report helped greatly, resumed  ? ? ?: Body mass index is 32.66 kg/m?Marland Kitchen. obesity  ?  ?  ? ? ? ?I have Reviewed nursing notes, Vitals, pain scores, I/o's, Lab results and  imaging results since pt's last encounter, details please see  discussion above  ?I ordered the following labs:  ?Unresulted Labs (From admission, onward)  ? ?  Start     Ordered  ? 12/25/21 0500  CBC with Differential/Platelet  Tomorrow morning,   R       ?Question:  Specimen collection method  Answer:  Lab=Lab collect  ? 12/24/21 1657  ? 12/25/21 6712  Basic metabolic panel  Tomorrow morning,   R       ?Question:  Specimen collection method  Answer:  Lab=Lab collect  ? 12/24/21 1657  ? 12/24/21 0500  Procalcitonin  Daily,   R     ?Question:  Specimen collection method  Answer:  Lab=Lab collect  ? 12/23/21 1239  ? ?  ?  ? ?  ? ? ? ?DVT prophylaxis: enoxaparin (LOVENOX) injection 40 mg Start: 12/23/21 1000 ? ? ?Code Status:   Code Status: Full Code ? ?Family Communication: daughter over the phone ?Disposition:  ? ?Status is: Inpatient ? ? ?Dispo: The patient is from: home ?             Anticipated d/c is to: snf ?             Anticipated d/c date is: snf, daughter report patient is significantly weaker compared to her baseline, not able to take care of her at home ? ?Antimicrobials:   ?Anti-infectives (From admission, onward)  ? ? Start     Dose/Rate Route Frequency Ordered Stop  ? 12/24/21 2200  levofloxacin (LEVAQUIN) IVPB 750 mg       ? 750 mg ?100 mL/hr over 90 Minutes Intravenous Every 48  hours 12/23/21 1305    ? 12/22/21 2200  levofloxacin (LEVAQUIN) IVPB 750 mg       ? 750 mg ?100 mL/hr over 90 Minutes Intravenous  Once 12/22/21 2151 12/22/21 2339  ? ?  ? ? ? ? ? ? ?Objective: ?Vitals:  ? 12/23/21 1236 12/23/21 2316 12/24/21 0650 12/24/21 1412  ?BP: (!) 152/64 (!) 148/73 (!) 147/70 (!) 121/59  ?Pulse: 86 84 79 100  ?Resp: '18 20 20 20  '$ ?Temp: 99.2 ?F (37.3 ?C) 99.3 ?F (37.4 ?C) 98.7 ?F (37.1 ?C) 99.3 ?F (37.4 ?C)  ?TempSrc: Oral Oral Oral Oral  ?SpO2: 98% 98% 100% 98%  ?Weight:      ?Height:      ? ? ?Intake/Output Summary (Last 24 hours) at 12/24/2021 1706 ?Last data filed at 12/24/2021 0645 ?Gross per 24 hour  ?Intake --  ?Output 1150 ml  ?Net -1150 ml  ? ?Filed Weights   ? 12/22/21 2106 12/23/21 0033  ?Weight: 88 kg 86.3 kg  ? ? ?Examination: ? ?General exam: alert, awake, pleasantly demented ?Respiratory system: diminished, no wheezing, no rales, no rhonchi. Respiratory effort normal. ?Cardiovascular system:  RRR.  ?Gastrointestinal system: Abdomen is nondistended, soft and nontender.  Normal bowel sounds heard. ?Central nervous system: Alert and oriented to person only, No focal neurological deficits. ?Extremities:  bilateral lower extremity edema has improved, R> L ?Skin: No rashes, lesions or ulcers ?Psychiatry: demented, calm and cooperative .  ? ? ? ?Data Reviewed: I have personally reviewed  labs and visualized  imaging studies since the last encounter and formulate the plan  ? ? ? ? ? ? ?Scheduled Meds: ? aspirin EC  81 mg Oral Daily  ? atorvastatin  40 mg Oral Daily  ? donepezil  10 mg Oral QHS  ? enoxaparin (LOVENOX) injection  40 mg Subcutaneous Q24H  ? fesoterodine  8 mg Oral Daily  ? latanoprost  1 drop Both Eyes QHS  ? lisinopril  5 mg Oral Daily  ? memantine  28 mg Oral Daily  ? ?Continuous Infusions: ? levofloxacin (LEVAQUIN) IV    ? ? ? LOS: 2 days  ? ? ? ? ?Florencia Reasons, MD PhD FACP ?[facility] ? ?Available via Epic secure chat 7am-7pm for nonurgent issues ?Please page for urgent issues ?To page the attending provider between 7A-7P or the covering provider during after hours 7P-7A, please log into the web site [email or web address] and access using universal [place] password for that web site. If you do not have the password, please call the hospital operator. ? ? ? ?12/24/2021, 5:06 PM  ? ? ?

## 2021-12-24 NOTE — Evaluation (Signed)
Occupational Therapy Evaluation ?Patient Details ?Name: Caroline French ?MRN: [id number] ?DOB: Apr 22, 1935 ?Today's Date: 12/24/2021 ? ? ?History of Present Illness [name] is a 87 y.o. female with medical history significant of dementia, hypertension history of breast cancer, history of diverticulitis, overactive bladder who presents by EMS for fever,Patient is found to have pneumonia on chest x-ray  ? ?Clinical Impression ?  ?Mrs. Charleen  French is an 87 year old woman who presents with admitted to hospital with above medical history and presents with generalized weakness, decreased activity tolerance, impaired balance and myclonus (muscle jerking of trunk and extremities). Patient needing mod assist to transfer to side of bed , mod assist x 2 to power up and min assist to take steps with walker. Patient limited by muscle jerking and reporting her knees felt weak. She needed heavy verbal cues to take steps to recliner and assistance with walker. Patient needing increased assistance with ADLs including max-total assist for LB ADLs and toileting. Patient will benefit from skilled OT services while in hospital to improve deficits and learn compensatory strategies as needed in order to return to PLOF.  Recommend short term rehab at discharge. ?  ?   ? ?Recommendations for follow up therapy are one component of a multi-disciplinary discharge planning process, led by the attending physician.  Recommendations may be updated based on patient status, additional functional criteria and insurance authorization.  ? ?Follow Up Recommendations ? Skilled nursing-short term rehab (<3 hours/day)  ?  ?Assistance Recommended at Discharge Frequent or constant Supervision/Assistance  ?Patient can return home with the following A lot of help with walking and/or transfers;A lot of help with bathing/dressing/bathroom;Direct supervision/assist for medications management;Assistance with cooking/housework;Direct supervision/assist for financial  management;Help with stairs or ramp for entrance;Assist for transportation ? ?  ?Functional Status Assessment ? Patient has had a recent decline in their functional status and demonstrates the ability to make significant improvements in function in a reasonable and predictable amount of time.  ?Equipment Recommendations ?  (TBD)  ?  ?Recommendations for Other Services   ? ? ?  ?Precautions / Restrictions Precautions ?Precautions: Fall ?Restrictions ?Weight Bearing Restrictions: No  ? ?  ? ?Mobility Bed Mobility ?Overal bed mobility: Needs Assistance ?Bed Mobility: Supine to Sit ?  ?  ?Supine to sit: Mod assist, HOB elevated, +2 for safety/equipment ?  ?  ?  ?  ? ?Transfers ?Overall transfer level: Needs assistance ?Equipment used: Rolling walker (2 wheels) ?Transfers: Sit to/from Stand, Bed to chair/wheelchair/BSC ?Sit to Stand: Mod assist, +2 physical assistance, From elevated surface ?  ?  ?Step pivot transfers: Min assist, +2 physical assistance ?  ?  ?  ?  ? ?  ?Balance Overall balance assessment: Needs assistance ?Sitting-balance support: No upper extremity supported, Feet supported ?Sitting balance-Leahy Scale: Fair ?  ?  ?Standing balance support: During functional activity, Reliant on assistive device for balance ?Standing balance-Leahy Scale: Poor ?  ?  ?  ?  ?  ?  ?  ?  ?  ?  ?  ?  ?   ? ?ADL either performed or assessed with clinical judgement  ? ?ADL Overall ADL's : Needs assistance/impaired ?Eating/Feeding: Set up;Sitting ?Eating/Feeding Details (indicate cue type and reason): able to hold cup and drink from straw ?Grooming: Sitting;Set up;Wash/dry face ?Grooming Details (indicate cue type and reason): able to wash face ?Upper Body Bathing: Moderate assistance;Sitting ?  ?Lower Body Bathing: Maximal assistance;Sit to/from stand ?  ?Upper Body Dressing : Moderate assistance;Sitting ?  ?Lower  Body Dressing: Maximal assistance;Sit to/from stand ?Lower Body Dressing Details (indicate cue type and reason):  able to reach down to pull up socks but unable to don. unable to manage clothing due to poor functional mobility and standing balance and reliant on walker ?Toilet Transfer: Moderate assistance;BSC/3in1;+2 for physical assistance ?Toilet Transfer Details (indicate cue type and reason): mod x 2 to power up ?Toileting- Clothing Manipulation and Hygiene: Maximal assistance ?Toileting - Clothing Manipulation Details (indicate cue type and reason): max assist for clothing management ?  ?  ?Functional mobility during ADLs: Moderate assistance;+2 for physical assistance;Rolling walker (2 wheels) ?General ADL Comments: Mod assist to transfer to side of bed. Mod x 2 to power up from bed. Min assist with heavy cues and assistance wtih walker to take steps to recliner.  ? ? ? ?Vision   ?Vision Assessment?: No apparent visual deficits  ?   ?Perception   ?  ?Praxis   ?  ? ?Pertinent Vitals/Pain Pain Assessment ?Pain Assessment: No/denies pain  ? ? ? ?Hand Dominance Right ?  ?Extremity/Trunk Assessment Upper Extremity Assessment ?Upper Extremity Assessment: RUE deficits/detail;LUE deficits/detail ?RUE Deficits / Details: Reports of history of shoulder injury 3-/5 shoulder, 3+/5 otherwise - intermittent myoclonus ?RUE Sensation:  (appears functional) ?RUE Coordination:  (appears functional) ?LUE Deficits / Details: grossly 3+/5 throughout ?LUE Sensation:  (appears functional) ?LUE Coordination:  (appears functional) ?  ?Lower Extremity Assessment ?Lower Extremity Assessment: Defer to PT evaluation ?  ?Cervical / Trunk Assessment ?Cervical / Trunk Assessment: Other exceptions ?Cervical / Trunk Exceptions: note jerking of the trunk and limbs when  moving to sitting on the bed edge ?  ?Communication Communication ?Communication: Expressive difficulties ?  ?Cognition Arousal/Alertness: Awake/alert ?Behavior During Therapy: [facility] for tasks assessed/performed ?Overall Cognitive Status: No family/caregiver present to determine baseline  cognitive functioning ?  ?  ?  ?  ?  ?  ?  ?  ?  ?  ?  ?  ?  ?  ?  ?  ?General Comments: Patient has history of dementia - unsure of more altered than normal. Alert to self and able to follow commands. ?  ?  ?General Comments    ? ?  ?Exercises   ?  ?Shoulder Instructions    ? ? ?Home Living Family/patient expects to be discharged to:: Private residence ?Living Arrangements: Children ?Available Help at Discharge: Family;Personal care attendant;Available 24 hours/day ?Type of Home: House ?Home Access: Stairs to enter ?Entrance Stairs-Number of Steps: 4 ?Entrance Stairs-Rails: Right ?Home Layout: Two level;Bed/bath upstairs ?Alternate Level Stairs-Number of Steps: 14 ?Alternate Level Stairs-Rails: Right ?Bathroom Shower/Tub: Walk-in shower ?  ?  ?  ?  ?Home Equipment: None ?  ?Additional Comments[facility], has a PCA for 1 hour a few days aweek_Info from daughter ?  ? ?  ?Prior Functioning/Environment Prior Level of Function : Needs assist ?  ?  ?  ?  ?  ?  ?Mobility Comments: independent ambulator in home, able to negotiate steps to upstairs 4-5 times /day, ?ADLs Comments: independent in dressing and toileting during day. ?  ? ?  ?  ?OT Problem List: Decreased strength;Decreased activity tolerance;Impaired balance (sitting and/or standing);Decreased cognition;Decreased safety awareness;Decreased coordination;Decreased knowledge of use of DME or AE;Obesity ?  ?   ?OT Treatment/Interventions: Self-care/ADL training;Therapeutic exercise;DME and/or AE instruction;Therapeutic activities;Balance training;Patient/family education  ?  ?OT Goals(Current goals can be found in the care plan section) Acute Rehab OT Goals ?OT Goal Formulation: Patient unable to participate  in goal setting ?Time For Goal Achievement: 01/07/22 ?Potential to Achieve Goals: Good  ?OT Frequency: Min 2X/week ?  ? ?Co-evaluation   ?  ?  ?  ?  ? ?  ?AM-PAC OT "6 Clicks" Daily Activity     ?Outcome Measure Help from another person  eating meals?: A Little ?Help from another person taking care of personal grooming?: A Little ?Help from another person toileting, which includes using toliet, bedpan, or urinal?: A Lot ?Help from another person b

## 2021-12-24 NOTE — Progress Notes (Signed)
Speech Language Pathology Treatment: Dysphagia  ?Patient Details ?Name: Caroline French ?MRN: [id number] ?DOB: August 31, 1935 ?Today's Date: 12/24/2021 ?Time: 6294-7654 ?SLP Time Calculation (min) (ACUTE ONLY): 15 min ? ?Assessment / Plan / Recommendation ?Clinical Impression ? Pt seen at bedside for skilled ST intervention targeting goals for tolerance of least restrictive diet following bedside evaluation completed 12/23/21. RN reports pt tolerated PO meds and morning meal without overt s/s aspiration. Pt was observed drinking juice via straw and eating graham crackers. Congested cough noted prior to PO intake. Pt reports no difficulty swallowing, but is not a reliable historian. No overt s/s aspiration observed during PO trials. Recommend continuing with soft/chopped diet, thin liquids. Upright position, small bites/sips at a slow rate with minimized distractions continues to be encouraged.  ? ?  ?HPI HPI: 87yo female admitted 12/22/21 with fever and intermittent dry cough. PMH: dementia, HTN, breast cancer, diverticulitis, overactive bladder, chronic RLE swelling. CXR = patchy infrahilar airspace disease worrisome for infection ?  ?   ?SLP Plan ? Continue with current plan of care ? ?  ?  ?Recommendations for follow up therapy are one component of a multi-disciplinary discharge planning process, led by the attending physician.  Recommendations may be updated based on patient status, additional functional criteria and insurance authorization. ?  ? ?Recommendations  ?Diet recommendations: Dysphagia 3 (mechanical soft);Thin liquid ?Liquids provided via: Straw;Cup ?Medication Administration: Whole meds with liquid ?Supervision: Staff to assist with self feeding;Patient able to self feed;Intermittent supervision to cue for compensatory strategies ?Compensations: Minimize environmental distractions;Slow rate;Small sips/bites ?Postural Changes and/or Swallow Maneuvers: Seated upright 90 degrees;Upright 30-60 min after meal  ?   ?     ?   ? ? ? ? Oral Care Recommendations: Oral care BID ?Follow Up Recommendations: Other (comment) (TBD) ?Assistance recommended at discharge: Frequent or constant Supervision/Assistance ?SLP Visit Diagnosis: Dysphagia, unspecified (R13.10) ?Plan: Continue with current plan of care ? ? ? ? ?  ?  ?Terald Jump B. Ishita Mcnerney, MSP, CCC-SLP ?Speech Language Pathologist ?Office: [phone] ? ?Shonna Chock ? ?12/24/2021, 9:56 AM ?

## 2021-12-24 NOTE — Progress Notes (Signed)
Patients daughter expressed concern about patient returning home with home health physical therapy. Patient is still a 2+ moderate assist to stand and get to the chair and with mobility. Patients daughter reports that she has a 2 story home and patients room is upstairs. She is concerned about how patient will get up and down the steps. I have notified MD Margaretha Glassing along with case management. I think the patient could benefit from another PT session. OT saw patient this morning and are recommending SNF for the patient. I do not think patient would be capable with climbing a flight of stairs at this time. Will notify oncoming nurse of situation to pass along.  ?

## 2021-12-25 DIAGNOSIS — J189 Pneumonia, unspecified organism: Secondary | ICD-10-CM | POA: Diagnosis not present

## 2021-12-25 DIAGNOSIS — R627 Adult failure to thrive: Secondary | ICD-10-CM | POA: Diagnosis not present

## 2021-12-25 DIAGNOSIS — F0150 Vascular dementia without behavioral disturbance: Secondary | ICD-10-CM | POA: Diagnosis not present

## 2021-12-25 DIAGNOSIS — E876 Hypokalemia: Secondary | ICD-10-CM | POA: Diagnosis not present

## 2021-12-25 LAB — BASIC METABOLIC PANEL
Anion gap: 8 (ref 5–15)
BUN: 13 mg/dL (ref 8–23)
CO2: 28 mmol/L (ref 22–32)
Calcium: 8.8 mg/dL — ABNORMAL LOW (ref 8.9–10.3)
Chloride: 104 mmol/L (ref 98–111)
Creatinine, Ser: 1.08 mg/dL — ABNORMAL HIGH (ref 0.44–1.00)
GFR, Estimated: 51 mL/min — ABNORMAL LOW (ref >60)
Glucose, Bld: 114 mg/dL — ABNORMAL HIGH (ref 70–99)
Potassium: 3.6 mmol/L (ref 3.5–5.1)
Sodium: 140 mmol/L (ref 135–145)

## 2021-12-25 LAB — CBC WITH DIFFERENTIAL/PLATELET
Abs Immature Granulocytes: 0.02 10*3/uL (ref 0.00–0.07)
Basophils Absolute: 0 10*3/uL (ref 0.0–0.1)
Basophils Relative: 1 %
Eosinophils Absolute: 0 10*3/uL (ref 0.0–0.5)
Eosinophils Relative: 1 %
HCT: 38.8 % (ref 36.0–46.0)
Hemoglobin: 12.4 g/dL (ref 12.0–15.0)
Immature Granulocytes: 0 %
Lymphocytes Relative: 14 %
Lymphs Abs: 0.8 10*3/uL (ref 0.7–4.0)
MCH: 30.3 pg (ref 26.0–34.0)
MCHC: 32 g/dL (ref 30.0–36.0)
MCV: 94.9 fL (ref 80.0–100.0)
Monocytes Absolute: 0.8 10*3/uL (ref 0.1–1.0)
Monocytes Relative: 13 %
Neutro Abs: 4.3 10*3/uL (ref 1.7–7.7)
Neutrophils Relative %: 71 %
Platelets: 144 10*3/uL — ABNORMAL LOW (ref 150–400)
RBC: 4.09 MIL/uL (ref 3.87–5.11)
RDW: 14.9 % (ref 11.5–15.5)
WBC: 6 10*3/uL (ref 4.0–10.5)
nRBC: 0 % (ref 0.0–0.2)

## 2021-12-25 LAB — PROCALCITONIN: Procalcitonin: 0.11 ng/mL

## 2021-12-25 MED ORDER — LEVOFLOXACIN 750 MG PO TABS
750.0000 mg | ORAL_TABLET | ORAL | Status: AC
  Administered 2021-12-26: 750 mg via ORAL
  Filled 2021-12-25: qty 1

## 2021-12-25 NOTE — Progress Notes (Signed)
Physical Therapy Treatment ?Patient Details ?Name: Caroline French ?MRN: [id number] ?DOB: December 02, 1934 ?Today's Date: 12/25/2021 ? ? ?History of Present Illness [place] is a 87 y.o. female with medical history significant of dementia, hypertension history of breast cancer, history of diverticulitis, overactive bladder who presents by EMS for fever,Patient is found to have pneumonia on chest x-ray ? ?  ?PT Comments  ? ? Patient is progressing slowly but continues to require 2+ assist for safety. She completed bed mobility with mod assist and Sit<>stand transfers with RW and Mod+2 assist for safety. PT able to transfer to [facility] and then amb short distance in room, ~6', to move to recliner with min assist and RW. At current mobility level pt requires too much assist to return home with family assist and will need ST rehab at SNF to address impairments and regain independence with mobility. Acute will continue to follow and progress as able. ? ?  ?Recommendations for follow up therapy are one component of a multi-disciplinary discharge planning process, led by the attending physician.  Recommendations may be updated based on patient status, additional functional criteria and insurance authorization. ? ?Follow Up Recommendations ? Skilled nursing-short term rehab (<3 hours/day) ?  ?  ?Assistance Recommended at Discharge Frequent or constant Supervision/Assistance  ?Patient can return home with the following Two people to help with walking and/or transfers;Assistance with cooking/housework;Assist for transportation;Help with stairs or ramp for entrance;Two people to help with bathing/dressing/bathroom;Assistance with feeding ?  ?Equipment Recommendations ? None recommended by PT  ?  ?Recommendations for Other Services   ? ? ?  ?Precautions / Restrictions Precautions ?Precautions: Fall ?Restrictions ?Weight Bearing Restrictions: No  ?  ? ?Mobility ? Bed Mobility ?Overal bed mobility: Needs Assistance ?Bed Mobility: Supine to Sit ?  ?   ?Supine to sit: Mod assist, HOB elevated, +2 for safety/equipment ?  ?  ?General bed mobility comments: Mod assist with multimodal cues to inintaite walking LE's off EOB. ?  ? ?Transfers ?Overall transfer level: Needs assistance ?Equipment used: Rolling walker (2 wheels) ?Transfers: Sit to/from Stand, Bed to chair/wheelchair/BSC ?Sit to Stand: Mod assist, +2 physical assistance, From elevated surface ?  ?Step pivot transfers: Min assist, +2 physical assistance, +2 safety/equipment ?  ?  ?  ?General transfer comment: Mod +2 with cues for hand placement on RW and asisst to initiate rise from EOB and steady once standing. Pt able to step feet with min assist to manage walker position and direction to move to [facility]. cues for safe reach back to control lower on BSC and recliner. ?  ? ?Ambulation/Gait ?Ambulation/Gait assistance: +2 safety/equipment, +2 physical assistance, Min assist ?Gait Distance (Feet): 6 Feet ?Assistive device: Rolling walker (2 wheels) ?Gait Pattern/deviations: Step-to pattern, Decreased stride length, Shuffle, Trunk flexed ?Gait velocity: decr ?  ?  ?General Gait Details: min assist +2 for safety to manage walker and guide turn to amb short distance from [facility] to recliner. ? ? ?Stairs ?  ?  ?  ?  ?  ? ? ?Wheelchair Mobility ?  ? ?Modified Rankin (Stroke Patients Only) ?  ? ? ?  ?Balance Overall balance assessment: Needs assistance ?Sitting-balance support: No upper extremity supported, Feet supported ?Sitting balance-Leahy Scale: Fair ?  ?  ?Standing balance support: During functional activity, Reliant on assistive device for balance ?Standing balance-Leahy Scale: Poor ?  ?  ?  ?  ?  ?  ?  ?  ?  ?  ?  ?  ?  ? ?  ?Cognition  Arousal/Alertness: Awake/alert ?Behavior During Therapy: [facility] for tasks assessed/performed ?Overall Cognitive Status: No family/caregiver present to determine baseline cognitive functioning ?  ?  ?  ?  ?  ?  ?  ?  ?  ?  ?  ?  ?  ?  ?  ?  ?General Comments: hx of dementia at baseline,  pt answered questions appropriately, flat affect throughout session. pt required cues for safety/sequencing tasks but was alert and followed commands with extra time. ?  ?  ? ?  ?Exercises   ? ?  ?General Comments   ?  ?  ? ?Pertinent Vitals/Pain Pain Assessment ?Pain Assessment: No/denies pain  ? ? ?Home Living   ?  ?  ?  ?  ?  ?  ?  ?  ?  ?   ?  ?Prior Function    ?  ?  ?   ? ?PT Goals (current goals can now be found in the care plan section) Acute Rehab PT Goals ?Patient Stated Goal: per daughter , return home if she is able  to return to prior level ?PT Goal Formulation: With family ?Time For Goal Achievement: 01/06/22 ?Potential to Achieve Goals: Fair ?Progress towards PT goals: Progressing toward goals ? ?  ?Frequency ? ? ? Min 3X/week ? ? ? ?  ?PT Plan Discharge plan needs to be updated  ? ? ?Co-evaluation   ?  ?  ?  ?  ? ?  ?AM-PAC PT "6 Clicks" Mobility   ?Outcome Measure ? Help needed turning from your back to your side while in a flat bed without using bedrails?: A Lot ?Help needed moving from lying on your back to sitting on the side of a flat bed without using bedrails?: A Lot ?Help needed moving to and from a bed to a chair (including a wheelchair)?: A Lot ?Help needed standing up from a chair using your arms (e.g., wheelchair or bedside chair)?: A Lot ?Help needed to walk in hospital room?: A Lot ?Help needed climbing 3-5 steps with a railing? : Total ?6 Click Score: 11 ? ?  ?End of Session Equipment Utilized During Treatment: Gait belt ?Activity Tolerance: Patient tolerated treatment well ?Patient left: in chair;with call bell/phone within reach;with chair alarm set;with family/visitor present ?Nurse Communication: Mobility status ?PT Visit Diagnosis: Unsteadiness on feet (R26.81) ?  ? ? ?Time: 5916-3846 ?PT Time Calculation (min) (ACUTE ONLY): 22 min ? ?Charges:  $Therapeutic Activity: 8-22 mins          ?          ? ?Gwynneth Albright PT, DPT ?Acute Rehabilitation Services ?Office [phone] ?Pager  [phone]  ? ? ?Jacques Navy ?12/25/2021, 11:06 AM ? ?

## 2021-12-25 NOTE — TOC CM/SW Note (Signed)
Transition of Care (TOC) -30 day Note   ?  ?  ?Patient Details  ?Name: Caroline French ?WCH:[id number] ?Date of Birth:1934/12/28 ?  ?Transition of Care (TOC) CM/SW Contact  ?Name:[name] ?Phone [phone] ?Date:12/25/2021 ?Time:3:32p ?  ?MUST ID: [id number] ?  ?To Whom it May Concern: ?  ?Please be advised that the above patient will require a short-term nursing home stay, anticipated 30 days or less rehabilitation and strengthening. The plan is for return home.  ?  ? ?

## 2021-12-25 NOTE — Progress Notes (Signed)
?PROGRESS NOTE ? ? ? ?Caroline French  BMW:[id number] DOB: 06/02/1935 DOA: 12/22/2021 ?PCP: Pleas Koch, NP  ? ? ? ?Brief Narrative:  ? ?Caroline French is a 87 y.o. female with medical history significant of dementia, hypertension history of breast cancer, history of diverticulitis, overactive bladder who presents by EMS for fever.  Patient is unable to give any history ? ?Subjective: ? ?No acute interval changes ?She is sitting up in chair, She is calm and pleasantly confused, not able to provide history ?There is no fever, vital signs are stable, I do not hear any cough today ? ? ? ?Assessment & Plan: ? Principal Problem: ?  CAP (community acquired pneumonia) ?Active Problems: ?  Sepsis ([place]) ?  AKI (acute kidney injury) ([place]) ?  Essential hypertension ?  Dementia ([place]) ? ? ? ?Assessment and Plan: ? ?* CAP (community acquired pneumonia), possible aspiration pneumonia ?Patient presented with fever, tachypnea,  ?Chest x-ray with patchy infrahilar airspace disease worrisome for infection ?Blood culture no growth, MRSA screen negative, flu/COVID-19 screen negative ?Concern for aspiration pneumonia ?Seen by speech, currently on soft diet, aspiration precaution ?Improving on Levaquin ( pcn allergy) ? ?Hypokalemia ?Replace K ?  ?CKDIIIa ?Cr appear close to baseline  ? ? ?Essential hypertension/HLD ?Continue lisinopril and  statin ? ?Bilateral lower extremity edema ?Chronic ?Venous Doppler no DVT ?Echocardiogram with preserved LVEF ,grade 1 diastolic dysfunction ?Elevate legs/compression stocking ?Will discuss with daughter regarding prn lasix ? ?Dementia ([place]) ?Continue aricept and namenda ?Also takes Risperdal nightly which daughter report helped greatly, resumed  ? ? ?: Body mass index is 32.66 kg/m?[place] Kitchen. obesity  ?  ?  ?FTT, plan for snf placement, awaiting for bed ? ? ?I have Reviewed nursing notes, Vitals, pain scores, I/o's, Lab results and  imaging results since pt's last encounter, details please see discussion above   ?I ordered the following labs:  ?Unresulted Labs (From admission, onward)  ? ?  Start     Ordered  ? 12/26/21 0500  CBC  Tomorrow morning,   R       ?Question:  Specimen collection method  Answer:  Lab=Lab collect  ? 12/25/21 1651  ? 12/26/21 2725  Basic metabolic panel  Tomorrow morning,   R       ?Question:  Specimen collection method  Answer:  Lab=Lab collect  ? 12/25/21 1651  ? 12/26/21 0500  Magnesium  Tomorrow morning,   STAT       ?Question:  Specimen collection method  Answer:  Lab=Lab collect  ? 12/25/21 1651  ? 12/26/21 0500  Vitamin B12  Tomorrow morning,   R       ?Question:  Specimen collection method  Answer:  Lab=Lab collect  ? 12/25/21 1651  ? ?  ?  ? ?  ? ? ? ?DVT prophylaxis: Place TED hose Start: 12/25/21 1651 ?enoxaparin (LOVENOX) injection 40 mg Start: 12/23/21 1000 ? ? ?Code Status:   Code Status: Full Code ? ?Family Communication: daughter over the phone ?Disposition:  ? ?Status is: Inpatient ? ? ?Dispo: The patient is from: home ?             Anticipated d/c is to: snf ?             Anticipated d/c date is: snf, daughter report patient is significantly weaker compared to her baseline, not able to take care of her at home ? ?Antimicrobials:   ?Anti-infectives (From admission, onward)  ? ? Start     Dose/Rate  Route Frequency Ordered Stop  ? 12/26/21 2200  levofloxacin (LEVAQUIN) tablet 750 mg       ? 750 mg Oral Every 48 hours 12/25/21 0954 12/28/21 2159  ? 12/24/21 2200  levofloxacin (LEVAQUIN) IVPB 750 mg  Status:  Discontinued       ? 750 mg ?100 mL/hr over 90 Minutes Intravenous Every 48 hours 12/23/21 1305 12/25/21 0953  ? 12/22/21 2200  levofloxacin (LEVAQUIN) IVPB 750 mg       ? 750 mg ?100 mL/hr over 90 Minutes Intravenous  Once 12/22/21 2151 12/22/21 2339  ? ?  ? ? ? ? ? ? ?Objective: ?Vitals:  ? 12/24/21 1739 12/24/21 2051 12/25/21 0427 12/25/21 1341  ?BP:  128/79 (!) 156/75 120/69  ?Pulse:  88 70 80  ?Resp:  18 (!) 22 20  ?Temp:  98.3 ?F (36.8 ?C) 98.2 ?F (36.8 ?C) 98.5 ?F (36.9 ?C)   ?TempSrc:  Oral Oral Oral  ?SpO2: 93% 100% 100% 98%  ?Weight:      ?Height:      ? ? ?Intake/Output Summary (Last 24 hours) at 12/25/2021 1652 ?Last data filed at 12/25/2021 0500 ?Gross per 24 hour  ?Intake 390 ml  ?Output 650 ml  ?Net -260 ml  ? ?Filed Weights  ? 12/22/21 2106 12/23/21 0033  ?Weight: 88 kg 86.3 kg  ? ? ?Examination: ? ?General exam: alert, awake, pleasantly demented ?Respiratory system: diminished, no wheezing, no rales, no rhonchi. Respiratory effort normal. ?Cardiovascular system:  RRR.  ?Gastrointestinal system: Abdomen is nondistended, soft and nontender.  Normal bowel sounds heard. ?Central nervous system: Alert and oriented to person only, No focal neurological deficits. ?Extremities:  bilateral lower extremity edema has improved, R> L ?Skin: No rashes, lesions or ulcers ?Psychiatry: demented, calm and cooperative .  ? ? ? ?Data Reviewed: I have personally reviewed  labs and visualized  imaging studies since the last encounter and formulate the plan  ? ? ? ? ? ? ?Scheduled Meds: ? aspirin EC  81 mg Oral Daily  ? atorvastatin  40 mg Oral Daily  ? calcium-vitamin D  1 tablet Oral Daily  ? donepezil  10 mg Oral QHS  ? enoxaparin (LOVENOX) injection  40 mg Subcutaneous Q24H  ? ferrous sulfate  325 mg Oral Q breakfast  ? fesoterodine  8 mg Oral Daily  ? latanoprost  1 drop Both Eyes QHS  ? [START ON 12/26/2021] levofloxacin  750 mg Oral Q48H  ? lisinopril  5 mg Oral Daily  ? memantine  28 mg Oral Daily  ? omega-3 acid ethyl esters  1 capsule Oral Daily  ? risperiDONE  0.5 mg Oral QHS  ? ?Continuous Infusions: ? ? ? ? LOS: 3 days  ? ? ? ? ?Florencia Reasons, MD PhD FACP ?[facility] ? ?Available via Epic secure chat 7am-7pm for nonurgent issues ?Please page for urgent issues ?To page the attending provider between 7A-7P or the covering provider during after hours 7P-7A, please log into the web site [email or web address] and access using universal [place] password for that web site. If you do not have the  password, please call the hospital operator. ? ? ? ?12/25/2021, 4:52 PM  ? ? ?

## 2021-12-25 NOTE — TOC Progression Note (Signed)
Transition of Care (TOC) - Progression Note  ? ? ?Patient Details  ?Name: Caroline French ?MRN: [id number] ?Date of Birth: [birth year] ? ?Transition of Care (TOC) CM/SW Contact  ?Dessa Phi, RN ?Phone Number: ?12/25/2021, 3:55 PM ? ?Clinical Narrative:  await pasrr, & bed offers-dtr [name] prefers [place].  ? ? ? ?Expected Discharge Plan: [place] ?Barriers to Discharge: Continued Medical Work up ? ?Expected Discharge Plan and Services ?Expected Discharge Plan: [place] ?  ?  ?  ?Living arrangements for the past 2 months: [place] ?                ?  ?  ?  ?  ?  ?  ?  ?  ?  ?  ? ? ?Social Determinants of Health (SDOH) Interventions ?  ? ?Readmission Risk Interventions ?No flowsheet data found. ? ?

## 2021-12-25 NOTE — Plan of Care (Signed)
  Problem: Clinical Measurements: Goal: Will remain free from infection Outcome: Progressing Goal: Respiratory complications will improve Outcome: Progressing   Problem: Activity: Goal: Risk for activity intolerance will decrease Outcome: Progressing   Problem: Nutrition: Goal: Adequate nutrition will be maintained Outcome: Progressing   Problem: Pain Managment: Goal: General experience of comfort will improve Outcome: Progressing   

## 2021-12-25 NOTE — Progress Notes (Signed)
Patient resting on home CPAP unit.  °

## 2021-12-25 NOTE — NC FL2 (Addendum)
?[place] MEDICAID FL2 LEVEL OF CARE SCREENING TOOL  ?  ? ?IDENTIFICATION  ?Patient Name: ?Caroline French Birthdate: 1935-03-16 Sex: female Admission Date (Current Location): ?12/22/2021  ?[place] and [place] Number: ? [place] ?  Facility and Address:  ?[facility],  [address] [place], [place] ?     Provider Number: ?[id number]  ?Attending Physician Name and Address:  ?Modena Jansky, MD ? Relative Name and Phone Number:  ?[name] Taylor(tr)[phone] ?   ?Current Level of Care: ?Hospital Recommended Level of Care: ?[place] Prior Approval Number: ?  ? ?Date Approved/Denied: ?  PASRR Number: ?[id number] A ? ?Discharge Plan: ?SNF ?  ? ?Current Diagnoses: ?Patient Active Problem List  ? Diagnosis Date Noted  ? Sepsis ([place]) 12/23/2021  ? Dementia ([place]) 12/23/2021  ? AKI (acute kidney injury) ([place]) 12/23/2021  ? CAP (community acquired pneumonia) 12/22/2021  ? Urgency of urination 09/23/2020  ? Gastroesophageal reflux disease 01/29/2020  ? Altered mental status 09/11/2019  ? Sleep apnea 10/19/2018  ? Prediabetes 07/21/2018  ? Hyperlipidemia 01/13/2018  ? Dementia with behavioral disturbance 01/13/2018  ? Overactive bladder 01/13/2018  ? Glaucoma 01/13/2018  ? Essential hypertension 01/13/2018  ? ? ?Orientation RESPIRATION BLADDER Height & Weight   ?  ?Self ? Normal Incontinent Weight: 190 lb 4.1 oz (86.3 kg) ?Height:  '5\' 4"'$  (162.6 cm)  ?BEHAVIORAL SYMPTOMS/MOOD NEUROLOGICAL BOWEL NUTRITION STATUS  ?    Incontinent Diet (dysphagia 1)  ?AMBULATORY STATUS COMMUNICATION OF NEEDS Skin   ?Limited Assist Verbally Normal ?  ?  ?  ?    ?     ?     ? ? ?Personal Care Assistance Level of Assistance  ?Bathing, Feeding, Dressing Bathing Assistance: Limited assistance ?Feeding assistance: Limited assistance ?Dressing Assistance: Limited assistance ?   ? ?Functional Limitations Info  ?Sight, Hearing, Speech Sight Info: Impaired (eyeglasses) ?Hearing Info: Adequate ?Speech Info: Impaired  (Dentures/dysphagia)  ? ? ?SPECIAL CARE FACTORS FREQUENCY  ?PT (By licensed PT), OT (By licensed OT)   ?  ?PT Frequency:  (5x week) ?OT Frequency:  (5x week) ?  ?  ?  ?   ? ? ?Contractures Contractures Info: Not present  ? ? ?Additional Factors Info  ?Code Status, Allergies, Psychotropic Code Status Info:  (Full) ?Allergies Info:  (Penicillins) ?Psychotropic Info:  (aricept/namenda/risperdal see MAR) ?  ?  ?   ? ?Current Medications (12/28/2021):  This is the current hospital active medication list ?Current Facility-Administered Medications  ?Medication Dose Route Frequency Provider Last Rate Last Admin  ? acetaminophen (TYLENOL) tablet 650 mg  650 mg Oral Q6H PRN Chotiner, Yevonne Aline, MD      ? Or  ? acetaminophen (TYLENOL) suppository 650 mg  650 mg Rectal Q6H PRN Chotiner, Yevonne Aline, MD      ? albuterol (PROVENTIL) (2.5 MG/3ML) 0.083% nebulizer solution 2.5 mg  2.5 mg Nebulization Q2H PRN Chotiner, Yevonne Aline, MD   2.5 mg at 12/24/21 1738  ? ascorbic acid (VITAMIN C) tablet 250 mg  250 mg Oral Daily Florencia Reasons, MD   250 mg at 12/28/21 7673  ? aspirin EC tablet 81 mg  81 mg Oral Daily Chotiner, Yevonne Aline, MD   81 mg at 12/28/21 4193  ? atorvastatin (LIPITOR) tablet 40 mg  40 mg Oral Daily Chotiner, Yevonne Aline, MD   40 mg at 12/28/21 7902  ? calcium-vitamin D (OSCAL WITH D) 500-5 MG-MCG per tablet 1 tablet  1 tablet Oral Daily Florencia Reasons, MD  1 tablet at 12/28/21 7793  ? chlorhexidine (PERIDEX) 0.12 % solution 15 mL  15 mL Mouth Rinse BID Florencia Reasons, MD   15 mL at 12/28/21 9030  ? docusate sodium (COLACE) capsule 100 mg  100 mg Oral Daily Florencia Reasons, MD   100 mg at 12/28/21 0923  ? donepezil (ARICEPT) tablet 10 mg  10 mg Oral QHS Chotiner, Yevonne Aline, MD   10 mg at 12/27/21 2218  ? enoxaparin (LOVENOX) injection 40 mg  40 mg Subcutaneous Q24H Chotiner, Yevonne Aline, MD   40 mg at 12/28/21 3007  ? ferrous sulfate tablet 325 mg  325 mg Oral Q breakfast Florencia Reasons, MD   325 mg at 12/28/21 0746  ? fesoterodine (TOVIAZ) tablet 8 mg  8  mg Oral Daily Chotiner, Yevonne Aline, MD   8 mg at 12/28/21 6226  ? hydrOXYzine (ATARAX) tablet 10-20 mg  10-20 mg Oral Daily PRN Florencia Reasons, MD   10 mg at 12/27/21 2218  ? latanoprost (XALATAN) 0.005 % ophthalmic solution 1 drop  1 drop Both Eyes QHS Chotiner, Yevonne Aline, MD   1 drop at 12/26/21 2124  ? lisinopril (ZESTRIL) tablet 5 mg  5 mg Oral Daily Chotiner, Yevonne Aline, MD   5 mg at 12/28/21 3335  ? loratadine (CLARITIN) tablet 10 mg  10 mg Oral Daily Florencia Reasons, MD   10 mg at 12/28/21 4562  ? MEDLINE mouth rinse  15 mL Mouth Rinse q12n4p Florencia Reasons, MD      ? memantine (NAMENDA XR) 24 hr capsule 28 mg  28 mg Oral Daily Chotiner, Yevonne Aline, MD   28 mg at 12/28/21 5638  ? omega-3 acid ethyl esters (LOVAZA) capsule 1 g  1 capsule Oral Daily Florencia Reasons, MD   1 g at 12/28/21 9373  ? ondansetron (ZOFRAN) tablet 4 mg  4 mg Oral Q6H PRN Chotiner, Yevonne Aline, MD      ? Or  ? ondansetron (ZOFRAN) injection 4 mg  4 mg Intravenous Q6H PRN Chotiner, Yevonne Aline, MD      ? polyethylene glycol (MIRALAX / GLYCOLAX) packet 17 g  17 g Oral Daily Florencia Reasons, MD   17 g at 12/28/21 4287  ? risperiDONE (RISPERDAL) tablet 0.5 mg  0.5 mg Oral QHS Florencia Reasons, MD   0.5 mg at 12/27/21 2218  ? senna-docusate (Senokot-S) tablet 1 tablet  1 tablet Oral QHS PRN Chotiner, Yevonne Aline, MD      ? senna-docusate (Senokot-S) tablet 1 tablet  1 tablet Oral QHS Florencia Reasons, MD   1 tablet at 12/27/21 2218  ? sertraline (ZOLOFT) tablet 25 mg  25 mg Oral Daily Florencia Reasons, MD   25 mg at 12/28/21 6811  ? sertraline (ZOLOFT) tablet 50 mg  50 mg Oral Daily Florencia Reasons, MD   50 mg at 12/28/21 5726  ? ? ? ?Discharge Medications: ?Please see discharge summary for a list of discharge medications. ? ?Relevant Imaging Results: ? ?Relevant Lab Results: ? ? ?Additional Information ?SS#[id number] ? ?Stanely Sexson, LCSW ? ? ? ? ?

## 2021-12-26 DIAGNOSIS — J189 Pneumonia, unspecified organism: Secondary | ICD-10-CM | POA: Diagnosis not present

## 2021-12-26 DIAGNOSIS — E876 Hypokalemia: Secondary | ICD-10-CM | POA: Diagnosis not present

## 2021-12-26 DIAGNOSIS — R627 Adult failure to thrive: Secondary | ICD-10-CM | POA: Diagnosis not present

## 2021-12-26 DIAGNOSIS — F0150 Vascular dementia without behavioral disturbance: Secondary | ICD-10-CM | POA: Diagnosis not present

## 2021-12-26 LAB — VITAMIN B12: Vitamin B-12: 733 pg/mL (ref 180–914)

## 2021-12-26 LAB — CBC
HCT: 40.3 % (ref 36.0–46.0)
Hemoglobin: 12.9 g/dL (ref 12.0–15.0)
MCH: 30.7 pg (ref 26.0–34.0)
MCHC: 32 g/dL (ref 30.0–36.0)
MCV: 96 fL (ref 80.0–100.0)
Platelets: 159 10*3/uL (ref 150–400)
RBC: 4.2 MIL/uL (ref 3.87–5.11)
RDW: 14.9 % (ref 11.5–15.5)
WBC: 5.4 10*3/uL (ref 4.0–10.5)
nRBC: 0 % (ref 0.0–0.2)

## 2021-12-26 LAB — BASIC METABOLIC PANEL
Anion gap: 9 (ref 5–15)
BUN: 23 mg/dL (ref 8–23)
CO2: 28 mmol/L (ref 22–32)
Calcium: 9.3 mg/dL (ref 8.9–10.3)
Chloride: 103 mmol/L (ref 98–111)
Creatinine, Ser: 1.09 mg/dL — ABNORMAL HIGH (ref 0.44–1.00)
GFR, Estimated: 50 mL/min — ABNORMAL LOW (ref >60)
Glucose, Bld: 111 mg/dL — ABNORMAL HIGH (ref 70–99)
Potassium: 3.7 mmol/L (ref 3.5–5.1)
Sodium: 140 mmol/L (ref 135–145)

## 2021-12-26 LAB — MAGNESIUM: Magnesium: 2.3 mg/dL (ref 1.7–2.4)

## 2021-12-26 MED ORDER — POTASSIUM CHLORIDE 20 MEQ PO PACK
40.0000 meq | PACK | Freq: Once | ORAL | Status: AC
  Administered 2021-12-26: 40 meq via ORAL
  Filled 2021-12-26: qty 2

## 2021-12-26 MED ORDER — HYDROXYZINE HCL 25 MG PO TABS
25.0000 mg | ORAL_TABLET | Freq: Once | ORAL | Status: AC
Start: 2021-12-26 — End: 2021-12-26
  Administered 2021-12-26: 25 mg via ORAL
  Filled 2021-12-26: qty 1

## 2021-12-26 MED ORDER — FUROSEMIDE 10 MG/ML IJ SOLN
40.0000 mg | Freq: Once | INTRAMUSCULAR | Status: AC
  Administered 2021-12-26: 40 mg via INTRAVENOUS
  Filled 2021-12-26: qty 4

## 2021-12-26 MED ORDER — SERTRALINE HCL 50 MG PO TABS
50.0000 mg | ORAL_TABLET | Freq: Every day | ORAL | Status: DC
Start: 2021-12-27 — End: 2021-12-28
  Administered 2021-12-27 – 2021-12-28 (×2): 50 mg via ORAL
  Filled 2021-12-26 (×2): qty 1

## 2021-12-26 MED ORDER — SERTRALINE HCL 25 MG PO TABS
25.0000 mg | ORAL_TABLET | Freq: Every day | ORAL | Status: DC
  Administered 2021-12-27 – 2021-12-28 (×2): 25 mg via ORAL
  Filled 2021-12-26 (×2): qty 1

## 2021-12-26 MED ORDER — POLYETHYLENE GLYCOL 3350 17 G PO PACK
17.0000 g | PACK | Freq: Every day | ORAL | Status: DC
  Administered 2021-12-26 – 2021-12-28 (×3): 17 g via ORAL
  Filled 2021-12-26 (×3): qty 1

## 2021-12-26 MED ORDER — ORAL CARE MOUTH RINSE
15.0000 mL | Freq: Two times a day (BID) | OROMUCOSAL | Status: DC
  Administered 2021-12-26 – 2021-12-27 (×3): 15 mL via OROMUCOSAL

## 2021-12-26 MED ORDER — SENNOSIDES-DOCUSATE SODIUM 8.6-50 MG PO TABS
1.0000 | ORAL_TABLET | Freq: Every day | ORAL | Status: DC
  Administered 2021-12-26 – 2021-12-27 (×2): 1 via ORAL
  Filled 2021-12-26 (×2): qty 1

## 2021-12-26 MED ORDER — DOCUSATE SODIUM 100 MG PO CAPS
100.0000 mg | ORAL_CAPSULE | Freq: Every day | ORAL | Status: DC
  Administered 2021-12-26 – 2021-12-28 (×3): 100 mg via ORAL
  Filled 2021-12-26 (×3): qty 1

## 2021-12-26 NOTE — TOC PASRR Note (Signed)
30 Day Passar Note ? ?RE: Caroline French        ?Date of Birth: [birth year]   ?Date:12/26/2021       ? ?To Whom It May Concern: ? ?Please be advised that the above-named patient will require a short-term nursing home stay - anticipated 30 days or less for rehabilitation and strengthening.  The plan is for return home. ? ? ?Evette Cristal, MSW, LCSW ?[phone] ? ? ?

## 2021-12-26 NOTE — Progress Notes (Signed)
Occupational Therapy Treatment ?Patient Details ?Name: Yanna Callas ?MRN: [id number] ?DOB: [birth year] ?Today's Date: 12/26/2021 ? ? ?History of present illness Ronnesha Hovanec is a 87 y.o. female with medical history significant of dementia, hypertension history of breast cancer, history of diverticulitis, overactive bladder who presents by EMS for fever,Patient is found to have pneumonia on chest x-ray ?  ?OT comments ? Patient was motivated to participate in session with continued need for +2 for safety with transfers with patient unsteady when standing. Patient was able to transfer to 3 in 1 commode and then to bed with +2 for safety with RW. Patient would continue to benefit from skilled OT services at this time while admitted and after d/c to address noted deficits in order to improve overall safety and independence in ADLs.  ?  ? ?Recommendations for follow up therapy are one component of a multi-disciplinary discharge planning process, led by the attending physician.  Recommendations may be updated based on patient status, additional functional criteria and insurance authorization. ?   ?Follow Up Recommendations ? Skilled nursing-short term rehab (<3 hours/day)  ?  ?Assistance Recommended at Discharge Frequent or constant Supervision/Assistance  ?Patient can return home with the following ? A lot of help with walking and/or transfers;A lot of help with bathing/dressing/bathroom;Direct supervision/assist for medications management;Assistance with cooking/housework;Direct supervision/assist for financial management;Help with stairs or ramp for entrance;Assist for transportation ?  ?Equipment Recommendations ? None recommended by OT  ?  ?Recommendations for Other Services   ? ?  ?Precautions / Restrictions Precautions ?Precautions: Fall ?Restrictions ?Weight Bearing Restrictions: No  ? ? ?  ? ?Mobility Bed Mobility ?Overal bed mobility: Needs Assistance ?Bed Mobility: Supine to Sit ?  ?  ?Supine to sit: Mod assist, HOB  elevated ?  ?  ?General bed mobility comments: with cues for initation and sequencing of BLE to edge of bed ?  ? ?Transfers ?  ?  ?  ?  ?  ?  ?  ?  ?  ?  ?  ?  ?Balance Overall balance assessment: Needs assistance ?Sitting-balance support: No upper extremity supported, Feet supported ?Sitting balance-Leahy Scale: Fair ?  ?  ?Standing balance support: During functional activity, Reliant on assistive device for balance ?Standing balance-Leahy Scale: Poor ?  ?  ?  ?  ?  ?  ?  ?  ?  ?  ?  ?  ?   ? ?ADL either performed or assessed with clinical judgement  ? ?ADL Overall ADL's : Needs assistance/impaired ?  ?  ?  ?  ?  ?  ?  ?  ?  ?  ?  ?  ?Toilet Transfer: Moderate assistance;BSC/3in1;+2 for physical assistance ?Toilet Transfer Details (indicate cue type and reason): mod x 2 to power upnoted muscle movement making patient unsteady.+2 needed for safety ?Toileting- Clothing Manipulation and Hygiene: Maximal assistance ?Toileting - Clothing Manipulation Details (indicate cue type and reason): max assist for clothing management and hygiene ?  ?  ?  ?General ADL Comments: Mod assist to transfer to side of bed. Mod x 2 to power up from bed. Min assist with heavy cues and assistance wtih walker to take steps to recliner. ?  ? ?Extremity/Trunk Assessment   ?  ?  ?  ?  ?  ? ?Vision   ?  ?  ?Perception   ?  ?Praxis   ?  ? ?Cognition Arousal/Alertness: Awake/alert ?Behavior During Therapy: [facility] for tasks assessed/performed ?Overall Cognitive Status: No family/caregiver present to determine  baseline cognitive functioning ?  ?  ?  ?  ?  ?  ?  ?  ?  ?  ?  ?  ?  ?  ?  ?  ?General Comments: patient was able to answer questions but noted to have some difficulty with timeline of moving to [place]. patient did indicate being in [place] during session at one point but later in session reported she had moved from there. patient needed sequencing and initation cues for all tasks. ?  ?  ?   ?Exercises   ? ?  ?Shoulder Instructions   ? ? ?  ?General  Comments    ? ? ?Pertinent Vitals/ Pain       Pain Assessment ?Pain Assessment: No/denies pain ? ?Home Living   ?  ?  ?  ?  ?  ?  ?  ?  ?  ?  ?  ?  ?  ?  ?  ?  ?  ?  ? ?  ?Prior Functioning/Environment    ?  ?  ?  ?   ? ?Frequency ? Min 2X/week  ? ? ? ? ?  ?Progress Toward Goals ? ?OT Goals(current goals can now be found in the care plan section) ? Progress towards OT goals: Progressing toward goals ? ?   ?Plan Discharge plan remains appropriate   ? ?Co-evaluation ? ? ?   ?  ?  ?  ?  ? ?  ?AM-PAC OT "6 Clicks" Daily Activity     ?Outcome Measure ? ? Help from another person eating meals?: A Little ?Help from another person taking care of personal grooming?: A Little ?  ?Help from another person bathing (including washing, rinsing, drying)?: A Lot ?Help from another person to put on and taking off regular upper body clothing?: A Lot ?Help from another person to put on and taking off regular lower body clothing?: A Lot ?6 Click Score: 12 ? ?  ?End of Session Equipment Utilized During Treatment: Gait belt;Rolling walker (2 wheels) ? ?OT Visit Diagnosis: Muscle weakness (generalized) (M62.81) ?  ?Activity Tolerance Patient tolerated treatment well ?  ?Patient Left in chair;with call bell/phone within reach;with chair alarm set;with nursing/sitter in room ?  ?Nurse Communication Mobility status ?  ? ?   ? ?Time: 1022-1100 ?OT Time Calculation (min): 38 min ? ?Charges: OT General Charges ?$OT Visit: 1 Visit ?OT Treatments ?$Self Care/Home Management : 38-52 mins ? ?Winslow Verrill OTR/L, MS ?Acute Rehabilitation Department ?Office# [phone] ?Pager# [phone] ? ? ?[place] ?12/26/2021, 1:05 PM ?

## 2021-12-26 NOTE — TOC Progression Note (Signed)
Transition of Care (TOC) - Progression Note  ? ? ?Patient Details  ?Name: Joanny Monica ?MRN: [id number] ?Date of Birth: 1935/03/15 ? ?Transition of Care (TOC) CM/SW Contact  ?Tawanna Cooler, RN ?Phone Number: ?12/26/2021, 4:47 PM ? ?Clinical Narrative:    ? ?Long offered a bed.  Confirmed with daughter that [facility] her [facility] choice and bed accepted. Level 2 PASRR number still pending.   ?Plan is discharge to [facility] pending PASRR number and bed availability.   ? ?Expected Discharge Plan: [place] ?Barriers to Discharge: Continued Medical Work up ? ?Expected Discharge Plan and Services ?Expected Discharge Plan: [place] ?  ?   ?Living arrangements for the past 2 months: [place] ?                ?  ?

## 2021-12-26 NOTE — Progress Notes (Signed)
?PROGRESS NOTE ? ? ? ?Caroline French  XAJ:[id number] DOB: [birth year] DOA: 12/22/2021 ?PCP: Pleas Koch, NP  ? ? ? ?Brief Narrative:  ? ?Caroline French is a 87 y.o. female with medical history significant of dementia, hypertension history of breast cancer, history of diverticulitis, overactive bladder who presents by EMS for fever.  Patient is unable to give any history ? ?Subjective: ? ?Refused CPAP machine last night ? ?She is sitting up in chair, She is calm and pleasantly confused, not able to provide history, but denies pain, reports feeling better ?There is no fever, vital signs are stable,  ? ?Daughter is concerned that she is not getting her stool softener daily ?Daughter reports still hear patient cough occasionally ? ? ? ?Assessment & Plan: ? Principal Problem: ?  CAP (community acquired pneumonia) ?Active Problems: ?  Sepsis ([place]) ?  AKI (acute kidney injury) ([place]) ?  Essential hypertension ?  Dementia ([place]) ? ? ? ?Assessment and Plan: ? ?* CAP (community acquired pneumonia), possible aspiration pneumonia ?Patient presented with fever, tachypnea,  ?Chest x-ray with patchy infrahilar airspace disease worrisome for infection ?Blood culture no growth, MRSA screen negative, flu/COVID-19 screen negative ?Concern for aspiration pneumonia ?Seen by speech, currently on soft diet, aspiration precaution ?Improving on Levaquin ( pcn allergy) ? ?Bilateral lower extremity edema, possible from chronic diastolic CHF ?Chronic ?Venous Doppler no DVT ?Echocardiogram with preserved LVEF ,grade 1 diastolic dysfunction ?Elevate legs/compression stocking ?daughter agreed to trial dose of Lasix, gave IV Lasix 40x1 today ? ?Hypokalemia ?Replace K ?  ?CKDIIIa ?Cr appear close to baseline  ? ? ?Essential hypertension/HLD ?Continue lisinopril and  statin ? ? ?Dementia ([place]) ?Continue aricept and namenda ?Also takes Risperdal nightly which daughter report helped greatly, resumed  ? ?Constipation, bm documented daily the last two days,  daughter requested miralax which is ordered  ? ? ?: Body mass index is 32.66 kg/m?Marland Kitchen. obesity  ?  ?  ?FTT, plan for snf placement, awaiting for bed ? ? ?I have Reviewed nursing notes, Vitals, pain scores, I/o's, Lab results and  imaging results since pt's last encounter, details please see discussion above  ?I ordered the following labs:  ?Unresulted Labs (From admission, onward)  ? ?  Start     Ordered  ? 12/27/21 0947  Basic metabolic panel  Tomorrow morning,   R       ?Question:  Specimen collection method  Answer:  Lab=Lab collect  ? 12/26/21 1622  ? 12/27/21 0500  Magnesium  Tomorrow morning,   STAT       ?Question:  Specimen collection method  Answer:  Lab=Lab collect  ? 12/26/21 1622  ? ?  ?  ? ?  ? ? ? ?DVT prophylaxis: Place TED hose Start: 12/25/21 1651 ?enoxaparin (LOVENOX) injection 40 mg Start: 12/23/21 1000 ? ? ?Code Status:   Code Status: Full Code ? ?Family Communication: daughter over the phone ?Disposition:  ? ?Status is: Inpatient ? ? ?Dispo: The patient is from: home ?             Anticipated d/c is to: snf ?             Anticipated d/c date is: Medically stable to snf, awaiting for PASSR likely Monday or Tuesday discharge ? ?Antimicrobials:   ?Anti-infectives (From admission, onward)  ? ? Start     Dose/Rate Route Frequency Ordered Stop  ? 12/26/21 2200  levofloxacin (LEVAQUIN) tablet 750 mg       ? 750 mg Oral  Every 48 hours 12/25/21 0954 12/28/21 2159  ? 12/24/21 2200  levofloxacin (LEVAQUIN) IVPB 750 mg  Status:  Discontinued       ? 750 mg ?100 mL/hr over 90 Minutes Intravenous Every 48 hours 12/23/21 1305 12/25/21 0953  ? 12/22/21 2200  levofloxacin (LEVAQUIN) IVPB 750 mg       ? 750 mg ?100 mL/hr over 90 Minutes Intravenous  Once 12/22/21 2151 12/22/21 2339  ? ?  ? ? ? ? ? ? ?Objective: ?Vitals:  ? 12/25/21 1341 12/25/21 2044 12/26/21 0628 12/26/21 1515  ?BP: 120/69 136/69 (!) 167/78 (!) 161/79  ?Pulse: 80 76 75 87  ?Resp: '20 16 16 19  '$ ?Temp: 98.5 ?F (36.9 ?C) 97.9 ?F (36.6 ?C) 97.6 ?F  (36.4 ?C) 98.2 ?F (36.8 ?C)  ?TempSrc: Oral Oral Oral Oral  ?SpO2: 98% 99% 98% 97%  ?Weight:      ?Height:      ? ? ?Intake/Output Summary (Last 24 hours) at 12/26/2021 1625 ?Last data filed at 12/26/2021 1100 ?Gross per 24 hour  ?Intake 360 ml  ?Output 850 ml  ?Net -490 ml  ? ?Filed Weights  ? 12/22/21 2106 12/23/21 0033  ?Weight: 88 kg 86.3 kg  ? ? ?Examination: ? ?General exam: alert, awake, pleasantly demented ?Respiratory system: diminished, no wheezing, no rales, no rhonchi. Respiratory effort normal. ?Cardiovascular system:  RRR.  ?Gastrointestinal system: Abdomen is nondistended, soft and nontender.  Normal bowel sounds heard. ?Central nervous system: Alert and oriented to person only, No focal neurological deficits. ?Extremities:  bilateral lower extremity edema has improved, R> L ?Skin: No rashes, lesions or ulcers ?Psychiatry: demented, calm and cooperative .  ? ? ? ?Data Reviewed: I have personally reviewed  labs and visualized  imaging studies since the last encounter and formulate the plan  ? ? ? ? ? ? ?Scheduled Meds: ? aspirin EC  81 mg Oral Daily  ? atorvastatin  40 mg Oral Daily  ? calcium-vitamin D  1 tablet Oral Daily  ? docusate sodium  100 mg Oral Daily  ? donepezil  10 mg Oral QHS  ? enoxaparin (LOVENOX) injection  40 mg Subcutaneous Q24H  ? ferrous sulfate  325 mg Oral Q breakfast  ? fesoterodine  8 mg Oral Daily  ? furosemide  40 mg Intravenous Once  ? latanoprost  1 drop Both Eyes QHS  ? levofloxacin  750 mg Oral Q48H  ? lisinopril  5 mg Oral Daily  ? mouth rinse  15 mL Mouth Rinse BID  ? memantine  28 mg Oral Daily  ? omega-3 acid ethyl esters  1 capsule Oral Daily  ? polyethylene glycol  17 g Oral Daily  ? potassium chloride  40 mEq Oral Once  ? risperiDONE  0.5 mg Oral QHS  ? senna-docusate  1 tablet Oral QHS  ? [START ON 12/27/2021] sertraline  25 mg Oral Daily  ? [START ON 12/27/2021] sertraline  50 mg Oral Daily  ? ?Continuous Infusions: ? ? ? ? LOS: 4 days  ? ? ? ? ?Florencia Reasons, MD PhD  FACP ?[facility] ? ?Available via Epic secure chat 7am-7pm for nonurgent issues ?Please page for urgent issues ?To page the attending provider between 7A-7P or the covering provider during after hours 7P-7A, please log into the web site [email or web address] and access using universal [place] password for that web site. If you do not have the password, please call the hospital operator. ? ? ? ?12/26/2021, 4:25 PM  ? ? ?

## 2021-12-26 NOTE — TOC Progression Note (Addendum)
Transition of Care (TOC) - Progression Note  ? ? ?Patient Details  ?Name: Caroline French ?MRN: [id number] ?Date of Birth: Nov 19, 1934 ? ?Transition of Care (TOC) CM/SW Contact  ?Ross Ludwig, LCSW ?Phone Number: ?12/26/2021, 5:29 PM ? ?Clinical Narrative:    ? ?CSW spoke to patient's daughter and provided bed offers.  Patient's daughter chose [facility]  CSW updated attending physician that patient's [facility] number is still pending.  CSW contacted [facility], and once [facility] number is back they can accept patient if she is medically ready for discharge.  CSW to continue to follow patient's progress throughout discharge planning. ? ?Expected Discharge Plan: [place] ?Barriers to Discharge: Continued Medical Work up ? ?Expected Discharge Plan and Services ?Expected Discharge Plan: [place] ?  ?  ?  ?Living arrangements for the past 2 months: [place] ?                ?  ?  ?  ?  ?  ?  ?  ?  ?  ?  ? ? ?Social Determinants of Health (SDOH) Interventions ?  ? ?Readmission Risk Interventions ?No flowsheet data found. ? ?

## 2021-12-27 DIAGNOSIS — R627 Adult failure to thrive: Secondary | ICD-10-CM | POA: Diagnosis not present

## 2021-12-27 DIAGNOSIS — F0150 Vascular dementia without behavioral disturbance: Secondary | ICD-10-CM | POA: Diagnosis not present

## 2021-12-27 DIAGNOSIS — E876 Hypokalemia: Secondary | ICD-10-CM | POA: Diagnosis not present

## 2021-12-27 DIAGNOSIS — J189 Pneumonia, unspecified organism: Secondary | ICD-10-CM | POA: Diagnosis not present

## 2021-12-27 LAB — CULTURE, BLOOD (ROUTINE X 2)
Culture: NO GROWTH
Culture: NO GROWTH
Special Requests: ADEQUATE
Special Requests: ADEQUATE

## 2021-12-27 LAB — MAGNESIUM: Magnesium: 2.3 mg/dL (ref 1.7–2.4)

## 2021-12-27 LAB — BASIC METABOLIC PANEL
Anion gap: 9 (ref 5–15)
BUN: 22 mg/dL (ref 8–23)
CO2: 28 mmol/L (ref 22–32)
Calcium: 9.1 mg/dL (ref 8.9–10.3)
Chloride: 104 mmol/L (ref 98–111)
Creatinine, Ser: 1.09 mg/dL — ABNORMAL HIGH (ref 0.44–1.00)
GFR, Estimated: 50 mL/min — ABNORMAL LOW (ref >60)
Glucose, Bld: 104 mg/dL — ABNORMAL HIGH (ref 70–99)
Potassium: 3.8 mmol/L (ref 3.5–5.1)
Sodium: 141 mmol/L (ref 135–145)

## 2021-12-27 MED ORDER — CHLORHEXIDINE GLUCONATE 0.12 % MT SOLN
15.0000 mL | Freq: Two times a day (BID) | OROMUCOSAL | Status: DC
  Administered 2021-12-28: 15 mL via OROMUCOSAL
  Filled 2021-12-27 (×2): qty 15

## 2021-12-27 MED ORDER — LORATADINE 10 MG PO TABS
10.0000 mg | ORAL_TABLET | Freq: Every day | ORAL | Status: DC
  Administered 2021-12-27 – 2021-12-28 (×2): 10 mg via ORAL
  Filled 2021-12-27 (×2): qty 1

## 2021-12-27 MED ORDER — ORAL CARE MOUTH RINSE: 15.0000 mL | Freq: Two times a day (BID) | OROMUCOSAL | Status: DC

## 2021-12-27 MED ORDER — FUROSEMIDE 10 MG/ML IJ SOLN
20.0000 mg | Freq: Once | INTRAMUSCULAR | Status: AC
Start: 2021-12-27 — End: 2021-12-27
  Administered 2021-12-27: 20 mg via INTRAVENOUS
  Filled 2021-12-27: qty 2

## 2021-12-27 MED ORDER — ASCORBIC ACID 500 MG PO TABS
250.0000 mg | ORAL_TABLET | Freq: Every day | ORAL | Status: DC
Start: 2021-12-27 — End: 2021-12-28
  Administered 2021-12-27 – 2021-12-28 (×2): 250 mg via ORAL
  Filled 2021-12-27 (×2): qty 1

## 2021-12-27 MED ORDER — HYDROXYZINE HCL 10 MG PO TABS
10.0000 mg | ORAL_TABLET | Freq: Every day | ORAL | Status: DC | PRN
  Administered 2021-12-27: 10 mg via ORAL
  Filled 2021-12-27 (×3): qty 2

## 2021-12-27 NOTE — Progress Notes (Signed)
Report given to RN on [place].  Patient transferred to 3 [place] via bed with belongings and family at bedside.  Bedside handoff performed. ? ?Angie Fava, RN  ?

## 2021-12-27 NOTE — Progress Notes (Addendum)
?PROGRESS NOTE ? ? ? ?Caroline French  KLK:[id number] DOB: 1935-01-19 DOA: 12/22/2021 ?PCP: Pleas Koch, NP  ? ? ? ?Brief Narrative:  ? ?Caroline French is a 87 y.o. female with medical history significant of dementia, hypertension history of breast cancer, history of diverticulitis, overactive bladder who presents by EMS for fever.  Patient is unable to give any history ? ?Subjective: ? ?No acute event documented in chart  overnight,  ?Per daughter, she was called at midnight due to patient was agitated and patient received  one dose of hydroxyzine. ? ?This morning, She is sitting up in chair, She is calm and pleasantly confused, only oriented to person ,  she could not remember what she had for breakfast, but denies pain ?There is no fever, vital signs are stable,  ? ? ? ? ?Assessment & Plan: ? Principal Problem: ?  CAP (community acquired pneumonia) ?Active Problems: ?  Sepsis ([place]) ?  AKI (acute kidney injury) ([place]) ?  Essential hypertension ?  Dementia ([place]) ? ? ? ?Assessment and Plan: ? ?* CAP (community acquired pneumonia), possible aspiration pneumonia ?Patient presented with fever, tachypnea,  ?Chest x-ray with patchy infrahilar airspace disease worrisome for infection ?Blood culture no growth, MRSA screen negative, flu/COVID-19 screen negative ?Concern for aspiration pneumonia ?Seen by speech, currently on soft diet, aspiration precaution ?Finished treatment with  Levaquin ( pcn allergy), last dose on 3/18 ? ?Bilateral lower extremity edema, possible from chronic diastolic CHF ?Daughter reports patient's legs used to be very edematous years ago, but no edema prior to coming to the hospital ?Venous Doppler no DVT ?Echocardiogram with preserved LVEF ,grade 1 diastolic dysfunction ?Elevate legs/compression stocking ?daughter agreed to trial  of Lasix, IV Lasix 40x1 on 3/18 then lasix '20mg'$  x1 on 3/19, monitor volume status, may need to discharge on as needed lasix ? ?Hypokalemia ?Replace K ?  ?CKDIIIa ?Cr appear  close to baseline  ? ? ?Essential hypertension/HLD ?Continue lisinopril and  statin ? ? ?Dementia ([place]), advanced,  ?Daughter reports patient was diagnosed with dementia in 2009 ?Continue aricept and namenda ?Also takes Risperdal nightly which daughter report helped greatly, resumed  ?Goals of care discussion, recommended palliative care to follow patient at snf and after return home from SNF, daughter expressed understanding. ?Delirium precaution order placed  ? ?Constipation, + BM ? ? ?: Body mass index is 32.66 kg/m?Marland Kitchen. obesity  ?  ?  ?FTT, plan for snf placement, awaiting for bed ? ? ?I have Reviewed nursing notes, Vitals, pain scores, I/o's, Lab results and  imaging results since pt's last encounter, details please see discussion above  ?I ordered the following labs:  ?Unresulted Labs (From admission, onward)  ? ?  Start     Ordered  ? 12/28/21 9794  Basic metabolic panel  Tomorrow morning,   R       ?Question:  Specimen collection method  Answer:  Lab=Lab collect  ? 12/27/21 1127  ? 12/28/21 0500  Magnesium  Tomorrow morning,   STAT       ?Question:  Specimen collection method  Answer:  Lab=Lab collect  ? 12/27/21 1127  ? ?  ?  ? ?  ? ? ? ?DVT prophylaxis: Place TED hose Start: 12/25/21 1651 ?enoxaparin (LOVENOX) injection 40 mg Start: 12/23/21 1000 ? ? ?Code Status:   Code Status: Full Code ? ?Family Communication: daughter over the phone daily ?Disposition:  ? ?Status is: Inpatient ? ? ?Dispo: The patient is from: home ?  Anticipated d/c is to: snf , recommend palliative care to follow patient at SNF ?             Anticipated d/c date is: Medically stable to snf, awaiting for PASSR likely discharge on Monday or Tuesday ? ?Antimicrobials:   ?Anti-infectives (From admission, onward)  ? ? Start     Dose/Rate Route Frequency Ordered Stop  ? 12/26/21 2200  levofloxacin (LEVAQUIN) tablet 750 mg       ? 750 mg Oral Every 48 hours 12/25/21 0954 12/26/21 2122  ? 12/24/21 2200  levofloxacin (LEVAQUIN) IVPB  750 mg  Status:  Discontinued       ? 750 mg ?100 mL/hr over 90 Minutes Intravenous Every 48 hours 12/23/21 1305 12/25/21 0953  ? 12/22/21 2200  levofloxacin (LEVAQUIN) IVPB 750 mg       ? 750 mg ?100 mL/hr over 90 Minutes Intravenous  Once 12/22/21 2151 12/22/21 2339  ? ?  ? ? ? ? ? ? ?Objective: ?Vitals:  ? 12/26/21 1515 12/26/21 1951 12/26/21 2309 12/27/21 8786  ?BP: (!) 161/79 (!) 155/63  112/72  ?Pulse: 87 88  73  ?Resp: 19 (!) 24 (!) 26 20  ?Temp: 98.2 ?F (36.8 ?C) 97.7 ?F (36.5 ?C)  98.5 ?F (36.9 ?C)  ?TempSrc: Oral Oral  Oral  ?SpO2: 97% 97%  99%  ?Weight:      ?Height:      ? ? ?Intake/Output Summary (Last 24 hours) at 12/27/2021 1128 ?Last data filed at 12/27/2021 0900 ?Gross per 24 hour  ?Intake 660 ml  ?Output 1500 ml  ?Net -840 ml  ? ?Filed Weights  ? 12/22/21 2106 12/23/21 0033  ?Weight: 88 kg 86.3 kg  ? ? ?Examination: ? ?General exam: alert, awake, pleasantly demented ?Respiratory system: diminished, no wheezing, no rales, no rhonchi. Respiratory effort normal. ?Cardiovascular system:  RRR.  ?Gastrointestinal system: Abdomen is nondistended, soft and nontender.  Normal bowel sounds heard. ?Central nervous system: Alert and oriented to person only, No focal neurological deficits. ?Extremities:  bilateral lower extremity edema has improved, R> L ?Skin: No rashes, lesions or ulcers ?Psychiatry: demented, calm and cooperative .  ? ? ? ?Data Reviewed: I have personally reviewed  labs and visualized  imaging studies since the last encounter and formulate the plan  ? ? ? ? ? ? ?Scheduled Meds: ? vitamin C  250 mg Oral Daily  ? aspirin EC  81 mg Oral Daily  ? atorvastatin  40 mg Oral Daily  ? calcium-vitamin D  1 tablet Oral Daily  ? docusate sodium  100 mg Oral Daily  ? donepezil  10 mg Oral QHS  ? enoxaparin (LOVENOX) injection  40 mg Subcutaneous Q24H  ? ferrous sulfate  325 mg Oral Q breakfast  ? fesoterodine  8 mg Oral Daily  ? furosemide  20 mg Intravenous Once  ? latanoprost  1 drop Both Eyes QHS  ?  lisinopril  5 mg Oral Daily  ? loratadine  10 mg Oral Daily  ? mouth rinse  15 mL Mouth Rinse BID  ? memantine  28 mg Oral Daily  ? omega-3 acid ethyl esters  1 capsule Oral Daily  ? polyethylene glycol  17 g Oral Daily  ? risperiDONE  0.5 mg Oral QHS  ? senna-docusate  1 tablet Oral QHS  ? sertraline  25 mg Oral Daily  ? sertraline  50 mg Oral Daily  ? ?Continuous Infusions: ? ? ? ? LOS: 5 days  ? ? ? ? ?  Florencia Reasons, MD PhD FACP ?[facility] ? ?Available via Epic secure chat 7am-7pm for nonurgent issues ?Please page for urgent issues ?To page the attending provider between 7A-7P or the covering provider during after hours 7P-7A, please log into the web site [email or web address] and access using universal [place] password for that web site. If you do not have the password, please call the hospital operator. ? ? ? ?12/27/2021, 11:28 AM  ? ? ?

## 2021-12-28 DIAGNOSIS — K5792 Diverticulitis of intestine, part unspecified, without perforation or abscess without bleeding: Secondary | ICD-10-CM | POA: Diagnosis not present

## 2021-12-28 DIAGNOSIS — I509 Heart failure, unspecified: Secondary | ICD-10-CM | POA: Diagnosis not present

## 2021-12-28 DIAGNOSIS — R2689 Other abnormalities of gait and mobility: Secondary | ICD-10-CM | POA: Diagnosis not present

## 2021-12-28 DIAGNOSIS — J188 Other pneumonia, unspecified organism: Secondary | ICD-10-CM | POA: Diagnosis not present

## 2021-12-28 DIAGNOSIS — J690 Pneumonitis due to inhalation of food and vomit: Secondary | ICD-10-CM | POA: Diagnosis not present

## 2021-12-28 DIAGNOSIS — R278 Other lack of coordination: Secondary | ICD-10-CM | POA: Diagnosis not present

## 2021-12-28 DIAGNOSIS — A419 Sepsis, unspecified organism: Secondary | ICD-10-CM | POA: Diagnosis not present

## 2021-12-28 DIAGNOSIS — I10 Essential (primary) hypertension: Secondary | ICD-10-CM | POA: Diagnosis not present

## 2021-12-28 DIAGNOSIS — J189 Pneumonia, unspecified organism: Secondary | ICD-10-CM | POA: Diagnosis not present

## 2021-12-28 DIAGNOSIS — I5032 Chronic diastolic (congestive) heart failure: Secondary | ICD-10-CM | POA: Diagnosis not present

## 2021-12-28 DIAGNOSIS — K219 Gastro-esophageal reflux disease without esophagitis: Secondary | ICD-10-CM | POA: Diagnosis not present

## 2021-12-28 DIAGNOSIS — N179 Acute kidney failure, unspecified: Secondary | ICD-10-CM | POA: Diagnosis not present

## 2021-12-28 DIAGNOSIS — F0390 Unspecified dementia without behavioral disturbance: Secondary | ICD-10-CM | POA: Diagnosis not present

## 2021-12-28 DIAGNOSIS — H42 Glaucoma in diseases classified elsewhere: Secondary | ICD-10-CM | POA: Diagnosis not present

## 2021-12-28 DIAGNOSIS — N183 Chronic kidney disease, stage 3 unspecified: Secondary | ICD-10-CM | POA: Diagnosis not present

## 2021-12-28 DIAGNOSIS — C50919 Malignant neoplasm of unspecified site of unspecified female breast: Secondary | ICD-10-CM | POA: Diagnosis not present

## 2021-12-28 DIAGNOSIS — R1312 Dysphagia, oropharyngeal phase: Secondary | ICD-10-CM | POA: Diagnosis not present

## 2021-12-28 DIAGNOSIS — R627 Adult failure to thrive: Secondary | ICD-10-CM | POA: Diagnosis not present

## 2021-12-28 DIAGNOSIS — N3281 Overactive bladder: Secondary | ICD-10-CM | POA: Diagnosis not present

## 2021-12-28 DIAGNOSIS — M6281 Muscle weakness (generalized): Secondary | ICD-10-CM | POA: Diagnosis not present

## 2021-12-28 DIAGNOSIS — R4182 Altered mental status, unspecified: Secondary | ICD-10-CM | POA: Diagnosis not present

## 2021-12-28 DIAGNOSIS — E876 Hypokalemia: Secondary | ICD-10-CM | POA: Diagnosis not present

## 2021-12-28 DIAGNOSIS — F03918 Unspecified dementia, unspecified severity, with other behavioral disturbance: Secondary | ICD-10-CM | POA: Diagnosis not present

## 2021-12-28 DIAGNOSIS — Z853 Personal history of malignant neoplasm of breast: Secondary | ICD-10-CM | POA: Diagnosis not present

## 2021-12-28 DIAGNOSIS — Z7401 Bed confinement status: Secondary | ICD-10-CM | POA: Diagnosis not present

## 2021-12-28 LAB — BASIC METABOLIC PANEL
Anion gap: 5 (ref 5–15)
BUN: 25 mg/dL — ABNORMAL HIGH (ref 8–23)
CO2: 30 mmol/L (ref 22–32)
Calcium: 9.1 mg/dL (ref 8.9–10.3)
Chloride: 103 mmol/L (ref 98–111)
Creatinine, Ser: 1.14 mg/dL — ABNORMAL HIGH (ref 0.44–1.00)
GFR, Estimated: 47 mL/min — ABNORMAL LOW (ref >60)
Glucose, Bld: 100 mg/dL — ABNORMAL HIGH (ref 70–99)
Potassium: 3.9 mmol/L (ref 3.5–5.1)
Sodium: 138 mmol/L (ref 135–145)

## 2021-12-28 LAB — MAGNESIUM: Magnesium: 2.2 mg/dL (ref 1.7–2.4)

## 2021-12-28 MED ORDER — HYDROXYZINE HCL 10 MG PO TABS: 10.0000 mg | ORAL_TABLET | Freq: Every day | ORAL | Status: DC | PRN

## 2021-12-28 MED ORDER — SERTRALINE HCL 25 MG PO TABS: 25.0000 mg | ORAL_TABLET | Freq: Every day | ORAL | Status: DC

## 2021-12-28 MED ORDER — SERTRALINE HCL 50 MG PO TABS: 50.0000 mg | ORAL_TABLET | Freq: Every day | ORAL | Status: DC

## 2021-12-28 NOTE — Discharge Summary (Signed)
Physician Discharge Summary  ?Caroline French YQM:[id number] DOB: 1934-12-24 ? ?PCP: Caroline Koch, NP ? ?Admitted from: Home  ?Discharged to: SNF ? ?Admit date: 12/22/2021 ?Discharge date: 12/28/2021 ? ?Recommendations for Outpatient Follow-up:  ? ? Contact information for follow-up providers   ? ? Caroline Koch, NP Follow up on 01/12/2022.   ?Specialty: Internal Medicine ?Why: To be seen with repeat labs (CBC & BMP).  Recommend palliative care consultation and follow-up at SNF and as outpatient upon discharge from SNF. ?Contact information: ?[place] ?[phone] ? ? ?  ?  ? ? [facility], [facility] Follow up.   ?Specialty: Home Health Services ?Why: Caroline French will contact you to arrange a time to visit. ?Contact information: ?[address] ?[address] ?[place] [address] ?[phone] ? ? ?  ?  ? ? MD at SNF. Schedule an appointment as soon as possible for a visit.   ?Why: To be seen in 3 to 4 days. Recommend palliative care consultation and follow-up at SNF and as outpatient upon discharge from SNF ? ?  ?  ? ?  ?  ? ? Contact information for after-discharge care   ? ? Destination   ? ? [facility] .   ?Service: Skilled Nursing ?Contact information: ?[facility] ?[place] ?[phone] ? ?  ?  ? ?  ?  ? ?  ?  ? ?  ? ? ? ?Home Health: None ?  ? ?Equipment/Devices: TBD at SNF ?  ? ?Discharge Condition: Improved and stable. ?  Code Status: Full Code ?Diet recommendation:  ?Discharge Diet Orders (From admission, onward)  ? ?  Start     Ordered  ? 12/28/21 0000  Diet - low sodium heart healthy       ?Comments: DIET DYS 3; Fluid consistency: Thin.   ?Comments: Please cut solids into bite size pieces ?Heart Healthy Restrictions  ? 12/28/21 1020  ? ?  ?  ? ?  ?  ? ?Discharge Diagnoses:  ?Principal Problem: ?  CAP (community acquired pneumonia) ?Active Problems: ?  Sepsis ([place]) ?  AKI (acute kidney injury) ([place]) ?  Essential  hypertension ?  Dementia ([place]) ? ? ?Brief Summary: ?87 year old female with medical history significant for dementia, hypertension, breast cancer, diverticulitis, overactive bladder, presented to the ED via EMS for complaints of fever.  Patient was unable to give any history due to history of dementia.  She reportedly developed fever the day prior to admission.  She had associated intermittent dry cough for the last days.  She also has chronically swollen right leg.  Admitted for suspected aspiration pneumonia.  Has completed course of antibiotics and clinically improved. ? ?Assessment and Plan: ?  ?*Suspected aspiration pneumonia ?Patient presented with fever, tachypnea,  ?Chest x-ray with patchy infrahilar airspace disease worrisome for infection ?Blood culture no growth, MRSA screen negative, flu/COVID-19 screen negative ?Concern for aspiration pneumonia ?Seen by speech, currently on soft diet, aspiration precaution ?Finished treatment with  Levaquin ( pcn allergy), last dose on 3/18 ?Recommend repeating chest x-ray in 4 weeks to ensure resolution of pneumonia findings. ?  ?Bilateral lower extremity edema, possible from chronic diastolic CHF ?Daughter reports patient's legs used to be very edematous years ago, but no edema prior to coming to the hospital ?Venous Doppler no DVT ?Echocardiogram with preserved LVEF ,grade 1 diastolic dysfunction ?Elevate legs/compression stocking ?daughter agreed to trial  of Lasix, IV Lasix 40x1 on 3/18 then  lasix '20mg'$  x1 on 3/19. ?-1.6 L thus far. ?Clinically improved.  Trace to no leg edema on day of discharge.  Patient has bilateral leg TED hose/compression stockings. ?Reassess volume status during close follow-up at SNF and could consider as needed low-dose Lasix or Lasix a couple times per week.  Hesitant to send on scheduled Lasix due to risk of volume depletion/dehydration in elderly female with history of dementia.  Creatinine has bumped up slightly from 1.09-1.14 likely  related to Lasix diuresis. ?  ?Hypokalemia ?Replaced.  Magnesium 2.2. ?  ?CKDIIIa ?Creatinine was steady in the 1.08-1.09 range until yesterday.  Today slightly up at 1.14 likely related to Lasix diuresis. ?Follow BMP closely as outpatient.  ?  ?Essential hypertension/HLD ?Continue lisinopril and  statin ?  ?Dementia ([place]), advanced,  ?Daughter reports patient was diagnosed with dementia in 2009 ?Continue aricept and namenda ?Also takes Risperdal nightly which daughter report helped greatly, resumed  ?Goals of care discussion had by prior [facility] MD, recommended palliative care to follow patient at snf and after return home from SNF, daughter expressed understanding. ?Delirium precaution order placed  ?  ?Constipation, ?Patient had BM on 3/19.  Continue PTA bowel regimen and adjust as needed. ? ?Body mass index is 32.66 kg/m?Caroline French ? ?Failure to thrive ?Due to advanced age, advanced dementia.  Recommend palliative care consultation and follow-up as outpatient. ?Currently going to SNF for short-term rehab. ? ? ?Consultations: ?None ? ?Procedures: ?None ? ? ?Discharge Instructions ? ?Discharge Instructions   ? ? Call MD for:  difficulty breathing, headache or visual disturbances   Complete by: As directed ?  ? Call MD for:  extreme fatigue   Complete by: As directed ?  ? Call MD for:  persistant dizziness or light-headedness   Complete by: As directed ?  ? Call MD for:  persistant nausea and vomiting   Complete by: As directed ?  ? Call MD for:  severe uncontrolled pain   Complete by: As directed ?  ? Call MD for:  temperature >100.4   Complete by: As directed ?  ? Diet - low sodium heart healthy   Complete by: As directed ?  ? DIET DYS 3; Fluid consistency: Thin.   ?Comments: Please cut solids into bite size pieces ?Heart Healthy Restrictions  ? Increase activity slowly   Complete by: As directed ?  ? No wound care   Complete by: As directed ?  ? ?  ? ?  ?Medication List  ?  ? ?TAKE these medications   ? ?Aspirin Low  Dose 81 MG EC tablet ?Generic drug: aspirin ?TAKE 1 TABLET BY MOUTH ONCE DAILY ?What changed: how much to take ?  ?atorvastatin 40 MG tablet ?Commonly known as: LIPITOR ?Take 1 tablet (40 mg total) by mouth daily. For cholesterol. ?  ?Calcium Citrate + D 315-5 MG-MCG Tabs ?Generic drug: Calcium Citrate-Vitamin D ?Take 1 tablet by mouth daily. ?  ?docusate sodium 100 MG capsule ?Commonly known as: COLACE ?Take 100 mg by mouth daily as needed for mild constipation. ?  ?donepezil 10 MG tablet ?Commonly known as: ARICEPT ?Take 1 tablet (10 mg total) by mouth at bedtime. For memory. ?  ?ferrous sulfate 325 (65 FE) MG tablet ?Take 325 mg by mouth daily. ?  ?FIBER ADULT GUMMIES PO ?Take 1 each by mouth daily. ?  ?fluticasone 50 MCG/ACT nasal spray ?Commonly known as: FLONASE ?INSTILL 1 SPRAY IN EACH NOSTRIL TWICE DAILY AS NEEDED FOR ALLERGIES ?What changed: See the new instructions. ?  ?  hydrOXYzine 10 MG tablet ?Commonly known as: ATARAX ?Take 1-2 tablets (10-20 mg total) by mouth daily as needed (for travel and anxiety). ?What changed: See the new instructions. ?  ?latanoprost 0.005 % ophthalmic solution ?Commonly known as: XALATAN ?Place 1 drop into both eyes at bedtime. ?  ?lisinopril 5 MG tablet ?Commonly known as: ZESTRIL ?TAKE 1 TABLET BY MOUTH EVERY MORNING FOR BLOOD PRESSURE ?What changed: See the new instructions. ?  ?loratadine 10 MG tablet ?Commonly known as: CLARITIN ?Take 10 mg by mouth daily. ?  ?memantine 28 MG Cp24 24 hr capsule ?Commonly known as: NAMENDA XR ?TAKE ONE (1) CAPSULE BY MOUTH ONCE DAILY FOR MEMORY ?What changed:  ?how much to take ?how to take this ?when to take this ?additional instructions ?  ?Multi-Vitamins Tabs ?Take 1 tablet by mouth daily. ?  ?omeprazole 20 MG capsule ?Commonly known as: PRILOSEC ?TAKE 1 CAPSULE BY MOUTH EVERY DAY FOR HEARTBURN ?What changed: See the new instructions. ?  ?polyethylene glycol powder 17 GM/SCOOP powder ?Commonly known as: GLYCOLAX/MIRALAX ?Take 17 g by  mouth daily as needed for mild constipation. ?  ?risperiDONE 0.5 MG tablet ?Commonly known as: RISPERDAL ?Take 1 tablet by mouth at bedtime. ?  ?Sea-Omega 30 1200 MG Caps ?Take 1 capsule by mouth daily. ?  ?sertraline

## 2021-12-28 NOTE — Progress Notes (Signed)
Speech Language Pathology Treatment: Dysphagia  ?Patient Details ?Name: Caroline French ?MRN: [id number] ?DOB: Jul 26, 1935 ?Today's Date: 12/28/2021 ?Time: 1035-1050 ?SLP Time Calculation (min) (ACUTE ONLY): 15 min ? ?Assessment / Plan / Recommendation ?Clinical Impression ? Patient seen by SLP for skilled treatment session focused on dysphagia goals. Patient with eyes closed but awakened and alerted to voice. Patient agreed to letting SLP remove dentures to clean; no significant residue on dentures or in oral cavity observed. When asked if she wanted something to drink, patient requested juice. She took successive, multiple straw sips of thin liquid juice with appearance of timely swallow initiation and no overt s/s aspiration or penetration during or after PO intake. Voice remained clear and strong throughout. Patient politely declined solid texture PO's. SLP is not recommending further skilled intervention at this venue of care but patient may benefit from screen versus evaluation by SLP at SNF. ? ?  ?HPI HPI: 87yo female admitted 12/22/21 with fever and intermittent dry cough. PMH: dementia, HTN, breast cancer, diverticulitis, overactive bladder, chronic RLE swelling. CXR = patchy infrahilar airspace disease worrisome for infection. Bedside/clinical swallow evaluation by SLP recommended Dys 3 solids, thin liquids. ?  ?   ?SLP Plan ? All goals met;Discharge SLP treatment due to (comment) ? ?  ?  ?Recommendations for follow up therapy are one component of a multi-disciplinary discharge planning process, led by the attending physician.  Recommendations may be updated based on patient status, additional functional criteria and insurance authorization. ?  ? ?Recommendations  ?Diet recommendations: Dysphagia 3 (mechanical soft);Thin liquid ?Liquids provided via: Straw;Cup ?Medication Administration: Whole meds with liquid ?Supervision: Staff to assist with self feeding;Patient able to self feed;Intermittent supervision to cue  for compensatory strategies ?Compensations: Minimize environmental distractions;Slow rate;Small sips/bites ?Postural Changes and/or Swallow Maneuvers: Seated upright 90 degrees;Upright 30-60 min after meal  ?   ?    ?   ? ? ? ? Oral Care Recommendations: Oral care BID ?Follow Up Recommendations: Skilled nursing-short term rehab (<3 hours/day) ?Assistance recommended at discharge: Frequent or constant Supervision/Assistance ?SLP Visit Diagnosis: Dysphagia, unspecified (R13.10) ?Plan: All goals met;Discharge SLP treatment due to (comment) ? ? ? ? ?  ?  ? ? ?Sonia Baller, MA, CCC-SLP ?Speech Therapy ? ?

## 2021-12-28 NOTE — TOC Transition Note (Signed)
Transition of Care (TOC) - CM/SW Discharge Note ? ? ?Patient Details  ?Name: Caroline French ?MRN: [id number] ?Date of Birth: 04/07/1935 ? ?Transition of Care (TOC) CM/SW Contact:  ?Zayven Powe, LCSW ?Phone Number: ?12/28/2021, 11:20 AM ? ? ?Clinical Narrative:    ?Pt medically cleared for dc today.  SNF bed accepted at [facility] and pt/ daughter aware will dc there today via [facility].  [facility] called at 11:15 am.  RN to call report to [phone].  No further TOC needs. ? ? ?Final next level of care: [place] ?Barriers to Discharge: Barriers Resolved ? ? ?Patient Goals and CMS Choice ?  ?  ?  ? ?Discharge Placement ?  ?           ?Patient chooses bed at: [facility] ?Patient to be transferred to facility by: [facility] ?Name of family member notified: daughter ?Patient and family notified of of transfer: 12/28/21 ? ?Discharge Plan and Services ?  ?  ?           ?DME Arranged: N/A ?DME Agency: NA ?  ?  ?  ?  ?  ?  ?  ?  ? ?Social Determinants of Health (SDOH) Interventions ?  ? ? ?Readmission Risk Interventions ?No flowsheet data found. ? ? ? ? ?

## 2021-12-28 NOTE — Progress Notes (Signed)
Patient is D/C to [facility] place. Attempted to call report no answer, left a voicemail to call for report. [facility] is here to pick patient up. Notified patient's daughter of D/C. She is meeting the patient at the facility.  ?

## 2021-12-28 NOTE — Discharge Instructions (Signed)

## 2021-12-29 DIAGNOSIS — F0390 Unspecified dementia without behavioral disturbance: Secondary | ICD-10-CM | POA: Diagnosis not present

## 2021-12-29 DIAGNOSIS — I10 Essential (primary) hypertension: Secondary | ICD-10-CM | POA: Diagnosis not present

## 2021-12-29 DIAGNOSIS — J189 Pneumonia, unspecified organism: Secondary | ICD-10-CM | POA: Diagnosis not present

## 2021-12-31 DIAGNOSIS — I10 Essential (primary) hypertension: Secondary | ICD-10-CM | POA: Diagnosis not present

## 2021-12-31 DIAGNOSIS — C50919 Malignant neoplasm of unspecified site of unspecified female breast: Secondary | ICD-10-CM | POA: Diagnosis not present

## 2021-12-31 DIAGNOSIS — F0390 Unspecified dementia without behavioral disturbance: Secondary | ICD-10-CM | POA: Diagnosis not present

## 2021-12-31 DIAGNOSIS — J189 Pneumonia, unspecified organism: Secondary | ICD-10-CM | POA: Diagnosis not present

## 2021-12-31 DIAGNOSIS — K5792 Diverticulitis of intestine, part unspecified, without perforation or abscess without bleeding: Secondary | ICD-10-CM | POA: Diagnosis not present

## 2021-12-31 DIAGNOSIS — N3281 Overactive bladder: Secondary | ICD-10-CM | POA: Diagnosis not present

## 2021-12-31 DIAGNOSIS — H42 Glaucoma in diseases classified elsewhere: Secondary | ICD-10-CM | POA: Diagnosis not present

## 2022-01-01 DIAGNOSIS — Z20822 Contact with and (suspected) exposure to covid-19: Secondary | ICD-10-CM | POA: Diagnosis not present

## 2022-01-05 ENCOUNTER — Telehealth: Payer: Self-pay | Admitting: Primary Care

## 2022-01-05 DIAGNOSIS — Z9841 Cataract extraction status, right eye: Secondary | ICD-10-CM | POA: Diagnosis not present

## 2022-01-05 DIAGNOSIS — F03918 Unspecified dementia, unspecified severity, with other behavioral disturbance: Secondary | ICD-10-CM

## 2022-01-05 DIAGNOSIS — N3281 Overactive bladder: Secondary | ICD-10-CM | POA: Diagnosis not present

## 2022-01-05 DIAGNOSIS — Z90710 Acquired absence of both cervix and uterus: Secondary | ICD-10-CM | POA: Diagnosis not present

## 2022-01-05 DIAGNOSIS — Z9842 Cataract extraction status, left eye: Secondary | ICD-10-CM | POA: Diagnosis not present

## 2022-01-05 DIAGNOSIS — Z853 Personal history of malignant neoplasm of breast: Secondary | ICD-10-CM | POA: Diagnosis not present

## 2022-01-05 DIAGNOSIS — Z9181 History of falling: Secondary | ICD-10-CM | POA: Diagnosis not present

## 2022-01-05 DIAGNOSIS — H409 Unspecified glaucoma: Secondary | ICD-10-CM | POA: Diagnosis not present

## 2022-01-05 DIAGNOSIS — F0390 Unspecified dementia without behavioral disturbance: Secondary | ICD-10-CM | POA: Diagnosis not present

## 2022-01-05 DIAGNOSIS — A419 Sepsis, unspecified organism: Secondary | ICD-10-CM | POA: Diagnosis not present

## 2022-01-05 DIAGNOSIS — N179 Acute kidney failure, unspecified: Secondary | ICD-10-CM | POA: Diagnosis not present

## 2022-01-05 DIAGNOSIS — Z7982 Long term (current) use of aspirin: Secondary | ICD-10-CM | POA: Diagnosis not present

## 2022-01-05 DIAGNOSIS — I5032 Chronic diastolic (congestive) heart failure: Secondary | ICD-10-CM | POA: Diagnosis not present

## 2022-01-05 DIAGNOSIS — I110 Hypertensive heart disease with heart failure: Secondary | ICD-10-CM | POA: Diagnosis not present

## 2022-01-05 DIAGNOSIS — J189 Pneumonia, unspecified organism: Secondary | ICD-10-CM | POA: Diagnosis not present

## 2022-01-05 NOTE — Telephone Encounter (Signed)
This should have gone to PCP ? ?Needs a referral order placed for Hospice. ?

## 2022-01-05 NOTE — Telephone Encounter (Signed)
Can we contact her daughter for an update? ?Please notify daughter that we were contacted by Octavia Bruckner, notify her of his request. Can she help provide some updated information? ?

## 2022-01-05 NOTE — Telephone Encounter (Signed)
Tim with [facility] called asking for a referral to Hospice for pt. Please advise. ?

## 2022-01-06 ENCOUNTER — Telehealth: Payer: Self-pay | Admitting: Primary Care

## 2022-01-06 ENCOUNTER — Telehealth: Payer: Self-pay

## 2022-01-06 NOTE — Telephone Encounter (Signed)
Spoke to daughter and patient does well and enjoys program and would like to continue if possible.  ?

## 2022-01-06 NOTE — Telephone Encounter (Signed)
Spoke to daughter, she has had decline in health. She is not able to do any daily care task without assistance. They are needing assistance getting some medical equipment. She has had increased memory issues as well.  ?

## 2022-01-06 NOTE — Telephone Encounter (Signed)
Spoke to [employer] all are separate things. All ok to order at the same time. Hospice is for evaluation to see if further services are needed. [facility] was ordered by hospital and letter is needed saying that she is physically able to attend day program   ?

## 2022-01-06 NOTE — Telephone Encounter (Signed)
Referral placed for hospice. ? ?I will hold off on the day center until we meet next week. ? ?(I am getting conflicting information requesting a letter stating that she is physically capable of participating in an adult daycare program, however she is also physically declining and needing hospice services.) ? ? ?

## 2022-01-06 NOTE — Telephone Encounter (Addendum)
Is [facility] a Hospice program or is this a different program? I'm not quite sure what is being requested. Hospice or the [facility] program? There's another phone note regarding [facility] orders, is that Hospice? ?

## 2022-01-06 NOTE — Addendum Note (Signed)
Addended by: Pleas Koch on: 01/06/2022 06:20 PM ? ? Modules accepted: Orders ? ?

## 2022-01-06 NOTE — Telephone Encounter (Signed)
Caroline French with [facility] called stating that they need a note stating that it was ok for pt to return to the connection program. Caroline French stated that they need the note before pt appt on 01/14/22. Please advise. ?

## 2022-01-06 NOTE — Telephone Encounter (Signed)
[facility] from [facility] [phone] ok to leave message.  ? ?Requesting orders for  ?Evaluation for PT, OT, speech, home health aid, social work and skilled nursing.  ? ?Wanted to verify some following changes from [place] ?Stopped Miralax  and added reguloid. But family has decided to go back to Miralax.  ?They had her nose spray as PRN but she will be going back to bid.  ?Changed her colace from two tab daily to one and family will go back to 2 a day.  ? ?

## 2022-01-07 ENCOUNTER — Telehealth: Payer: Self-pay | Admitting: Primary Care

## 2022-01-07 DIAGNOSIS — N179 Acute kidney failure, unspecified: Secondary | ICD-10-CM | POA: Diagnosis not present

## 2022-01-07 DIAGNOSIS — F0390 Unspecified dementia without behavioral disturbance: Secondary | ICD-10-CM | POA: Diagnosis not present

## 2022-01-07 DIAGNOSIS — A419 Sepsis, unspecified organism: Secondary | ICD-10-CM | POA: Diagnosis not present

## 2022-01-07 DIAGNOSIS — I110 Hypertensive heart disease with heart failure: Secondary | ICD-10-CM | POA: Diagnosis not present

## 2022-01-07 DIAGNOSIS — J189 Pneumonia, unspecified organism: Secondary | ICD-10-CM | POA: Diagnosis not present

## 2022-01-07 DIAGNOSIS — I5032 Chronic diastolic (congestive) heart failure: Secondary | ICD-10-CM | POA: Diagnosis not present

## 2022-01-07 NOTE — Telephone Encounter (Signed)
Home Health verbal orders ?Caller Name:[facility] ?Agency Name: [facility] ? ?Callback number: [phone] ? ?Requesting OT/PT/Skilled nursing/Social Work/Speech: ? ?Reason:PT ? ?Frequency:1 wk for 9 wks ? ?Please forward to [facility] pool or providers CMA  ?

## 2022-01-07 NOTE — Telephone Encounter (Signed)
Approved.  

## 2022-01-08 ENCOUNTER — Telehealth: Payer: Self-pay

## 2022-01-08 NOTE — Telephone Encounter (Signed)
Agree with ED visit for evaluation. ?

## 2022-01-08 NOTE — Telephone Encounter (Signed)
[facility] - Client ?TELEPHONE ADVICE RECORD ?AccessNurse? ?Patient ?Name: ?Caroline French ?N ?Gender: Female ?DOB: October 08, 1935 ?Age: 87 Y 4 M 23 D ?Return ?Phone ?Number: ?[phone] ?(Primary) ?Address: ?City/ ?State/ ?Zip: [place] ? [address] ?Client [facility] - Client ?Client Site [place] - Day ?Provider Alma Friendly - NP ?Contact Type Call ?Who Is Calling Patient / Member / Family / Caregiver ?Call Type Triage / Clinical ?Caller Name sharon taylor ?Relationship To Patient Daughter ?Return Phone Number [phone] (Primary) ?Chief Complaint Cough ?Reason for Call Symptomatic / Request for Health Information ?Initial Comment caller states her mom is coughing alot after ?choking on some liquid , she is having a hard time ?eating and drinking. ?Translation No ?Nurse Assessment ?Nurse: Doyle Askew, RN, Beth Date/Time Eilene Ghazi Time): 01/08/2022 2:16:29 PM ?Confirm and document reason for call. If ?symptomatic, describe symptoms. ?---Caller states her mom is coughing a lot after choking ?on some liquid a couple of days ago, she is having a ?hard time eating and drinking. ?Does the patient have any new or worsening ?symptoms? ---Yes ?Will a triage be completed? ---Yes ?Related visit to physician within the last 2 weeks? ---N/A ?Does the PT have any chronic conditions? (i.e. ?diabetes, asthma, this includes High risk factors for ?pregnancy, etc.) ?---Yes ?List chronic conditions. ---dementia, sleep apnea, HTN ?Is this a behavioral health or substance abuse call? ---No ?Guidelines ?Guideline Title Affirmed Question Affirmed Notes Nurse Date/Time (Eastern ?Time) ?Choking - Inhaled ?Foreign Body ?Coughing or other ?airway symptoms ?return ?Doyle Askew, RN, [place] 01/08/2022 2:20:03 ?PM ?Disp. Time (Eastern ?Time) Disposition Final User ?01/08/2022 2:24:35 PM See HCP within 4 Hours (or ?PCP triage) ?Yes Doyle Askew, RN, [place] ?PLEASE NOTE: All timestamps  contained within this report are represented as [place] Standard Time. ?CONFIDENTIALTY NOTICE: This fax transmission is intended only for the addressee. It contains information that is legally privileged, confidential or ?otherwise protected from use or disclosure. If you are not the intended recipient, you are strictly prohibited from reviewing, disclosing, copying using ?or disseminating any of this information or taking any action in reliance on or regarding this information. If you have received this fax in error, please ?notify us immediately by telephone so that we can arrange for its return to [place]. Phone: [phone], Toll-Free: [phone], Fax: [phone] ?Page: 2 of 2 ?Call Id: [id number] ?Caller Disagree/Comply Comply ?Caller Understands Yes ?PreDisposition Call a family member ?Care Advice Given Per Guideline ?SEE HCP (OR PCP TRIAGE) WITHIN 4 HOURS: * IF OFFICE WILL BE OPEN: You need to be seen within the next 3 or 4 ?hours. Call your doctor (or NP/PA) now or as soon as the office opens. CALL BACK IF: * You become worse CARE ADVICE ?given per Choking - Inhaled FB (Adult) guideline ?Referrals ?REFERRED TO PCP OFFIC ?

## 2022-01-08 NOTE — Telephone Encounter (Signed)
Verbal orders given to [place] by telephone as instructed. ?

## 2022-01-08 NOTE — Telephone Encounter (Signed)
Received call from daughter. Patient was sent to access nurse for triage and told by them she needed to be see in in 4 hours. No visits were available in our office. She will take to ED for evaluation.  ?

## 2022-01-08 NOTE — Telephone Encounter (Signed)
See note below access nurse note. I was unable to speak with pt or pts daughter and per phone note at 2:32 pm pts daughter was going to take pt to ED for evaluation. Sending note to Gentry Fitz NP and [facility] CMA. Will teams Mardelle Matte CMA. ?

## 2022-01-10 ENCOUNTER — Other Ambulatory Visit: Payer: Self-pay | Admitting: Primary Care

## 2022-01-10 DIAGNOSIS — E785 Hyperlipidemia, unspecified: Secondary | ICD-10-CM

## 2022-01-10 DIAGNOSIS — I10 Essential (primary) hypertension: Secondary | ICD-10-CM

## 2022-01-10 DIAGNOSIS — F03918 Unspecified dementia, unspecified severity, with other behavioral disturbance: Secondary | ICD-10-CM

## 2022-01-10 DIAGNOSIS — N3281 Overactive bladder: Secondary | ICD-10-CM

## 2022-01-12 ENCOUNTER — Encounter: Payer: Self-pay | Admitting: Primary Care

## 2022-01-12 ENCOUNTER — Ambulatory Visit (INDEPENDENT_AMBULATORY_CARE_PROVIDER_SITE_OTHER)
Admission: RE | Admit: 2022-01-12 | Discharge: 2022-01-12 | Disposition: A | Discharge status: Home / Self Care | Payer: Medicare Other | Source: Ambulatory Visit | Attending: Primary Care | Admitting: Primary Care

## 2022-01-12 ENCOUNTER — Ambulatory Visit (INDEPENDENT_AMBULATORY_CARE_PROVIDER_SITE_OTHER): Payer: Medicare Other | Admitting: Primary Care

## 2022-01-12 VITALS — BP 110/68 | HR 76 | Ht 67.0 in | Wt 187.0 lb

## 2022-01-12 DIAGNOSIS — E785 Hyperlipidemia, unspecified: Secondary | ICD-10-CM

## 2022-01-12 DIAGNOSIS — J984 Other disorders of lung: Secondary | ICD-10-CM | POA: Diagnosis not present

## 2022-01-12 DIAGNOSIS — F0390 Unspecified dementia without behavioral disturbance: Secondary | ICD-10-CM | POA: Diagnosis not present

## 2022-01-12 DIAGNOSIS — F03918 Unspecified dementia, unspecified severity, with other behavioral disturbance: Secondary | ICD-10-CM

## 2022-01-12 DIAGNOSIS — Z8701 Personal history of pneumonia (recurrent): Secondary | ICD-10-CM | POA: Diagnosis not present

## 2022-01-12 DIAGNOSIS — J189 Pneumonia, unspecified organism: Secondary | ICD-10-CM

## 2022-01-12 DIAGNOSIS — N179 Acute kidney failure, unspecified: Secondary | ICD-10-CM | POA: Diagnosis not present

## 2022-01-12 DIAGNOSIS — R7303 Prediabetes: Secondary | ICD-10-CM

## 2022-01-12 DIAGNOSIS — I10 Essential (primary) hypertension: Secondary | ICD-10-CM | POA: Diagnosis not present

## 2022-01-12 DIAGNOSIS — I110 Hypertensive heart disease with heart failure: Secondary | ICD-10-CM | POA: Diagnosis not present

## 2022-01-12 DIAGNOSIS — I5032 Chronic diastolic (congestive) heart failure: Secondary | ICD-10-CM | POA: Diagnosis not present

## 2022-01-12 DIAGNOSIS — M419 Scoliosis, unspecified: Secondary | ICD-10-CM | POA: Diagnosis not present

## 2022-01-12 DIAGNOSIS — A419 Sepsis, unspecified organism: Secondary | ICD-10-CM | POA: Diagnosis not present

## 2022-01-12 LAB — CBC WITH DIFFERENTIAL/PLATELET
Basophils Absolute: 0 10*3/uL (ref 0.0–0.1)
Basophils Relative: 0.7 % (ref 0.0–3.0)
Eosinophils Absolute: 0.1 10*3/uL (ref 0.0–0.7)
Eosinophils Relative: 1.2 % (ref 0.0–5.0)
HCT: 37.1 % (ref 36.0–46.0)
Hemoglobin: 12.2 g/dL (ref 12.0–15.0)
Lymphocytes Relative: 25.1 % (ref 12.0–46.0)
Lymphs Abs: 1.3 10*3/uL (ref 0.7–4.0)
MCHC: 32.9 g/dL (ref 30.0–36.0)
MCV: 93.9 fl (ref 78.0–100.0)
Monocytes Absolute: 0.4 10*3/uL (ref 0.1–1.0)
Monocytes Relative: 7.7 % (ref 3.0–12.0)
Neutro Abs: 3.3 10*3/uL (ref 1.4–7.7)
Neutrophils Relative %: 65.3 % (ref 43.0–77.0)
Platelets: 167 10*3/uL (ref 150.0–400.0)
RBC: 3.95 Mil/uL (ref 3.87–5.11)
RDW: 16.1 % — ABNORMAL HIGH (ref 11.5–15.5)
WBC: 5.1 10*3/uL (ref 4.0–10.5)

## 2022-01-12 LAB — BASIC METABOLIC PANEL
BUN: 13 mg/dL (ref 6–23)
CO2: 31 mEq/L (ref 19–32)
Calcium: 9.4 mg/dL (ref 8.4–10.5)
Chloride: 105 mEq/L (ref 96–112)
Creatinine, Ser: 1.13 mg/dL (ref 0.40–1.20)
GFR: 44.71 mL/min — ABNORMAL LOW (ref >60.00)
Glucose, Bld: 91 mg/dL (ref 70–99)
Potassium: 3.8 mEq/L (ref 3.5–5.1)
Sodium: 141 mEq/L (ref 135–145)

## 2022-01-12 LAB — HEMOGLOBIN A1C: Hgb A1c MFr Bld: 6.3 % (ref 4.6–6.5)

## 2022-01-12 NOTE — Assessment & Plan Note (Signed)
Controlled. ? ?Continue lisinopril 5 mg daily. ?Lisinopril could be contributing to her cough.   ? ?We will check chest x-ray today to rule out lingering pneumonia, however may need to discontinue lisinopril. ?

## 2022-01-12 NOTE — Assessment & Plan Note (Signed)
Improving since home from SNF. ? ?Following with neurology. ? ?Continue sertraline 75 mg daily, Namenda XR 28 mg daily, Risperdal 1 mg at bedtime ?

## 2022-01-12 NOTE — Assessment & Plan Note (Signed)
Recent hospitalization. ?Hospital notes, labs, imaging reviewed. ? ?Repeat chest x-ray pending today. ? ?Suspect lisinopril could be contributing to her cough, consider discontinuation given stability of her blood pressure and her age. ? ?Await results. ?

## 2022-01-12 NOTE — Patient Instructions (Signed)
Stop by the lab and xray prior to leaving today. I will notify you of your results once received.   It was a pleasure to see you today!   

## 2022-01-12 NOTE — Progress Notes (Signed)
? ?Subjective:  ? ? Patient ID: Caroline French, female    DOB: [birth year], 87 y.o.   MRN: [id number] ? ?HPI ? ?Caroline French is a very pleasant 87 y.o. female with a history of hypertension, CAP, sleep apnea, dementia with behavioral disturbance, overactive bladder, AKI, prediabetes, sepsis who presents today for hospital follow-up.  She is accompanied by her daughter today. ? ?She presented to [facility] via EMS for fevers, dry cough.  Work-up in the ED was consistent for aspiration pneumonia with sepsis.  She was admitted for further evaluation and treatment.  ? ?During her hospital stay she was treated with IV fluids, IV antibiotics. She underwent venous Doppler for bilateral lower extremity edema, negative for DVT.  She underwent echocardiogram which revealed grade 1 diastolic dysfunction, preserved LVEF.  She was treated with furosemide 40 mg x 1 dose, then 20 mg x 1 dose following day. ? ?She was discharged to SNF on 12/28/2021 with recommendations for repeat chest x-ray in 4 weeks, consideration of low-dose furosemide every few days for lower extremity swelling.  Palliative care referral was recommended. ? ?Today she is home as of 01/01/22. She was discharged home from [facility] early as she deteriorated mentally.  Since she has been home, her daughter endorses her delirium has resolved and she is doing much better and at her baseline. Both she and her daughter would like to get her back to her adult daycare memory program which is on Monday and Wednesday for four hours. Did well on this program and would like to resume her participation.  She is needing a letter today for approval. ? ?Her daughter has noticed an intermittent, dry cough since she has been home.  She is concerned the pneumonia has not resolved.  She denies fevers, chills, body aches. She was evaluated by speech therapy today for cough when eating.  She will be undergoing a swallowing study soon for her dysphagia. She has been visited by physical therapy,  the nurse is coming out today. She will be seen by OT soon.  ? ? ? ? ? ?Review of Systems  ?Constitutional:  Negative for chills and fever.  ?Respiratory:  Positive for cough. Negative for shortness of breath.   ?Cardiovascular:  Negative for chest pain.  ?Psychiatric/Behavioral:  The patient is not nervous/anxious.   ?     Behavior has stabilized since being home.  ? ?   ? ? ?Past Medical History:  ?Diagnosis Date  ? Breast cancer ([place]) 1992's  ? left breast  ? Dementia with behavioral disturbance ([place])   ? Diverticulitis   ? Essential hypertension   ? Glaucoma   ? Hallucinations   ? Overactive bladder   ? ? ?Social History  ? ?Socioeconomic History  ? Marital status: Widowed  ?  Spouse name: Not on file  ? Number of children: Not on file  ? Years of education: Not on file  ? Highest education level: Not on file  ?Occupational History  ? Not on file  ?Tobacco Use  ? Smoking status: Never  ? Smokeless tobacco: Never  ?Vaping Use  ? Vaping Use: Never used  ?Substance and Sexual Activity  ? Alcohol use: Never  ? Drug use: Never  ? Sexual activity: Not Currently  ?Other Topics Concern  ? Not on file  ?Social History Narrative  ? Widow.  ? 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  ? Moved from [place].  ? ?Social Determinants of Health  ? ?Financial Resource Strain: Low Risk   ?  Difficulty of Paying Living Expenses: Not hard at all  ?Food Insecurity: No Food Insecurity  ? Worried About Charity fundraiser in the Last Year: Never true  ? Ran Out of Food in the Last Year: Never true  ?Transportation Needs: No Transportation Needs  ? Lack of Transportation (Medical): No  ? Lack of Transportation (Non-Medical): No  ?Physical Activity: Insufficiently Active  ? Days of Exercise per Week: 7 days  ? Minutes of Exercise per Session: 10 min  ?Stress: No Stress Concern Present  ? Feeling of Stress : Not at all  ?Social Connections: Not on file  ?Intimate Partner Violence: Not At Risk  ? Fear of Current or Ex-Partner: No  ? Emotionally Abused:  No  ? Physically Abused: No  ? Sexually Abused: No  ? ? ?Past Surgical History:  ?Procedure Laterality Date  ? ABDOMINAL HYSTERECTOMY    ? BREAST BIOPSY    ? BREAST LUMPECTOMY Left 1990's  ? CATARACT EXTRACTION, BILATERAL  08/012020  ? ? ?History reviewed. No pertinent family history. ? ?Allergies  ?Allergen Reactions  ? Penicillins Rash  ?  Did it involve swelling of the face/tongue/throat, SOB, or low BP? Yes ?Did it involve sudden or severe rash/hives, skin peeling, or any reaction on the inside of your mouth or nose? Unk ?Did you need to seek medical attention at a hospital or doctor's office? Unk ?When did it last happen? "I was young" ?If all above answers are ?NO?, may proceed with cephalosporin use. ?  ? ? ?Current Outpatient Medications on File Prior to Visit  ?Medication Sig Dispense Refill  ? Ascorbic Acid (VITAMIN C PO) Take 1 tablet by mouth daily.    ? aspirin (ASPIRIN LOW DOSE) 81 MG EC tablet TAKE 1 TABLET BY MOUTH ONCE DAILY (Patient taking differently: Take 81 mg by mouth daily.) 90 tablet 0  ? atorvastatin (LIPITOR) 40 MG tablet Take 1 tablet (40 mg total) by mouth daily. For cholesterol. 90 tablet 0  ? CALCIUM CITRATE + D 315-200 MG-UNIT tablet Take 1 tablet by mouth daily.  11  ? docusate sodium (COLACE) 100 MG capsule Take 100 mg by mouth daily as needed for mild constipation.    ? donepezil (ARICEPT) 10 MG tablet Take 1 tablet (10 mg total) by mouth at bedtime. For memory. 90 tablet 3  ? ferrous sulfate 325 (65 FE) MG tablet Take 325 mg by mouth daily.    ? FIBER ADULT GUMMIES PO Take 1 each by mouth daily.    ? fluticasone (FLONASE) 50 MCG/ACT nasal spray INSTILL 1 SPRAY IN EACH NOSTRIL TWICE DAILY AS NEEDED FOR ALLERGIES (Patient taking differently: Place 1 spray into both nostrils 2 (two) times daily as needed for allergies.) 48 g 1  ? hydrOXYzine (ATARAX) 10 MG tablet Take 1-2 tablets (10-20 mg total) by mouth daily as needed (for travel and anxiety).    ? latanoprost (XALATAN) 0.005 %  ophthalmic solution Place 1 drop into both eyes at bedtime.    ? lisinopril (ZESTRIL) 5 MG tablet TAKE 1 TABLET BY MOUTH EVERY MORNING FOR BLOOD PRESSURE (Patient taking differently: Take 5 mg by mouth daily.) 90 tablet 2  ? loratadine (CLARITIN) 10 MG tablet Take 10 mg by mouth daily.    ? memantine (NAMENDA XR) 28 MG CP24 24 hr capsule TAKE ONE (1) CAPSULE BY MOUTH ONCE DAILY FOR MEMORY (Patient taking differently: Take 28 mg by mouth daily.) 90 capsule 2  ? Multiple Vitamin (MULTI-VITAMINS) TABS Take 1 tablet  by mouth daily.  11  ? Omega-3 Fatty Acids (SEA-OMEGA 30) 1200 MG CAPS Take 1 capsule by mouth daily.  11  ? omeprazole (PRILOSEC) 20 MG capsule TAKE 1 CAPSULE BY MOUTH EVERY DAY FOR HEARTBURN (Patient taking differently: Take 20 mg by mouth daily.) 90 capsule 3  ? polyethylene glycol powder (GLYCOLAX/MIRALAX) 17 GM/SCOOP powder Take 17 g by mouth daily as needed for mild constipation. Pt is  taking at every day    ? risperiDONE (RISPERDAL) 0.5 MG tablet Take 1 tablet by mouth at bedtime.    ? sertraline (ZOLOFT) 25 MG tablet Take 1 tablet (25 mg total) by mouth daily. Take with '50mg'$  for total daily dose of '75mg'$     ? sertraline (ZOLOFT) 50 MG tablet Take 1 tablet (50 mg total) by mouth daily. Take with '25mg'$  for total daily dose of '75mg'$     ? TOVIAZ 8 MG TB24 tablet TAKE 1 TABLET BY MOUTH ONCE DAILY FOR OVERACTIVE BLADDER (Patient taking differently: Take 8 mg by mouth daily.) 90 tablet 2  ? ?No current facility-administered medications on file prior to visit.  ? ? ?BP 110/68   Pulse 76   Ht '5\' 7"'$  (1.702 m)   Wt 187 lb (84.8 kg)   SpO2 96%   BMI 29.29 kg/m?  ?Objective:  ? Physical Exam ?Cardiovascular:  ?   Rate and Rhythm: Normal rate and regular rhythm.  ?Pulmonary:  ?   Effort: Pulmonary effort is normal.  ?   Breath sounds: Normal breath sounds. No wheezing or rhonchi.  ?   Comments: Dry cough noted once during visit ?Musculoskeletal:  ?   Cervical back: Neck supple.  ?Skin: ?   General: Skin is warm  and dry.  ?Neurological:  ?   Mental Status: She is alert.  ?   Comments: Answer some questions during visit today.  ?Psychiatric:     ?   Mood and Affect: Mood normal.  ? ? ? ? ? ?   ?Assessment & Plan:  ?

## 2022-01-12 NOTE — Assessment & Plan Note (Signed)
Continue atorvastatin 40 mg daily. ?Lipid panel reviewed from May 2022. ?

## 2022-01-12 NOTE — Assessment & Plan Note (Signed)
Repeat A1C pending. 

## 2022-01-13 NOTE — Telephone Encounter (Signed)
I spoke with patient's daughter yesterday and she mentioned that her mother's prescription will need to be sent to a different pharmacy. ? ?Which pharmacy? ?

## 2022-01-14 ENCOUNTER — Ambulatory Visit: Payer: Medicare Other | Admitting: Primary Care

## 2022-01-18 ENCOUNTER — Telehealth: Payer: Self-pay

## 2022-01-18 NOTE — Telephone Encounter (Signed)
Hershal Coria, Speech Therapist with [facility] asking for verbal orders 1 x week for 8 weeks. ?

## 2022-01-18 NOTE — Telephone Encounter (Signed)
Approved.  

## 2022-01-19 DIAGNOSIS — F0390 Unspecified dementia without behavioral disturbance: Secondary | ICD-10-CM | POA: Diagnosis not present

## 2022-01-19 DIAGNOSIS — J189 Pneumonia, unspecified organism: Secondary | ICD-10-CM | POA: Diagnosis not present

## 2022-01-19 DIAGNOSIS — I5032 Chronic diastolic (congestive) heart failure: Secondary | ICD-10-CM | POA: Diagnosis not present

## 2022-01-19 DIAGNOSIS — Z961 Presence of intraocular lens: Secondary | ICD-10-CM | POA: Diagnosis not present

## 2022-01-19 DIAGNOSIS — A419 Sepsis, unspecified organism: Secondary | ICD-10-CM | POA: Diagnosis not present

## 2022-01-19 DIAGNOSIS — N179 Acute kidney failure, unspecified: Secondary | ICD-10-CM | POA: Diagnosis not present

## 2022-01-19 DIAGNOSIS — I110 Hypertensive heart disease with heart failure: Secondary | ICD-10-CM | POA: Diagnosis not present

## 2022-01-19 DIAGNOSIS — H401133 Primary open-angle glaucoma, bilateral, severe stage: Secondary | ICD-10-CM | POA: Diagnosis not present

## 2022-01-19 NOTE — Telephone Encounter (Signed)
Caroline French notified by telephone that verbal orders have been approved by PCP. ?

## 2022-01-19 NOTE — Telephone Encounter (Signed)
Left message to return call to our office.  

## 2022-01-21 NOTE — Telephone Encounter (Signed)
Spoke to daughter. States that they are going to hold off on pharmacy change would like to have everything but eye drops sent mail order.  ?

## 2022-01-22 NOTE — Telephone Encounter (Signed)
Left message to return call to our office.  

## 2022-01-22 NOTE — Telephone Encounter (Signed)
Noted. ? ?Did she look at my result note on MyChart regarding lisinopril and cough? ?

## 2022-01-25 NOTE — Telephone Encounter (Signed)
Spoke to daughter she has not seen message. I have looked in chart. And did not see to give her information. Can you resend in my chart message? Daughter will keep eye out for new message.  ?

## 2022-01-25 NOTE — Telephone Encounter (Signed)
This is located in her xray results. Result my chart note. ?Have her look there. ? ?It's possible that Vicente Males may have talked with her on 04/11? ?

## 2022-01-26 DIAGNOSIS — A419 Sepsis, unspecified organism: Secondary | ICD-10-CM | POA: Diagnosis not present

## 2022-01-26 DIAGNOSIS — F0390 Unspecified dementia without behavioral disturbance: Secondary | ICD-10-CM | POA: Diagnosis not present

## 2022-01-26 DIAGNOSIS — I5032 Chronic diastolic (congestive) heart failure: Secondary | ICD-10-CM | POA: Diagnosis not present

## 2022-01-26 DIAGNOSIS — N179 Acute kidney failure, unspecified: Secondary | ICD-10-CM | POA: Diagnosis not present

## 2022-01-26 DIAGNOSIS — J189 Pneumonia, unspecified organism: Secondary | ICD-10-CM | POA: Diagnosis not present

## 2022-01-26 DIAGNOSIS — I110 Hypertensive heart disease with heart failure: Secondary | ICD-10-CM | POA: Diagnosis not present

## 2022-01-28 DIAGNOSIS — F0390 Unspecified dementia without behavioral disturbance: Secondary | ICD-10-CM | POA: Diagnosis not present

## 2022-01-28 DIAGNOSIS — A419 Sepsis, unspecified organism: Secondary | ICD-10-CM | POA: Diagnosis not present

## 2022-01-28 DIAGNOSIS — N179 Acute kidney failure, unspecified: Secondary | ICD-10-CM | POA: Diagnosis not present

## 2022-01-28 DIAGNOSIS — I5032 Chronic diastolic (congestive) heart failure: Secondary | ICD-10-CM | POA: Diagnosis not present

## 2022-01-28 DIAGNOSIS — I110 Hypertensive heart disease with heart failure: Secondary | ICD-10-CM | POA: Diagnosis not present

## 2022-01-28 DIAGNOSIS — J189 Pneumonia, unspecified organism: Secondary | ICD-10-CM | POA: Diagnosis not present

## 2022-01-29 ENCOUNTER — Ambulatory Visit
Admission: RE | Admit: 2022-01-29 | Discharge: 2022-01-29 | Disposition: A | Discharge status: Home / Self Care | Payer: Medicare Other | Source: Ambulatory Visit | Attending: Primary Care | Admitting: Primary Care

## 2022-01-29 ENCOUNTER — Telehealth: Payer: Self-pay

## 2022-01-29 DIAGNOSIS — I5032 Chronic diastolic (congestive) heart failure: Secondary | ICD-10-CM | POA: Diagnosis not present

## 2022-01-29 DIAGNOSIS — Z1231 Encounter for screening mammogram for malignant neoplasm of breast: Secondary | ICD-10-CM | POA: Diagnosis not present

## 2022-01-29 DIAGNOSIS — F0390 Unspecified dementia without behavioral disturbance: Secondary | ICD-10-CM | POA: Diagnosis not present

## 2022-01-29 DIAGNOSIS — A419 Sepsis, unspecified organism: Secondary | ICD-10-CM | POA: Diagnosis not present

## 2022-01-29 DIAGNOSIS — I110 Hypertensive heart disease with heart failure: Secondary | ICD-10-CM | POA: Diagnosis not present

## 2022-01-29 DIAGNOSIS — J189 Pneumonia, unspecified organism: Secondary | ICD-10-CM | POA: Diagnosis not present

## 2022-01-29 DIAGNOSIS — N3281 Overactive bladder: Secondary | ICD-10-CM

## 2022-01-29 DIAGNOSIS — N179 Acute kidney failure, unspecified: Secondary | ICD-10-CM | POA: Diagnosis not present

## 2022-01-29 DIAGNOSIS — F03918 Unspecified dementia, unspecified severity, with other behavioral disturbance: Secondary | ICD-10-CM

## 2022-01-29 NOTE — Telephone Encounter (Signed)
Caroline French is requesting order for a three in one potty chair sent to [facility]. Phone call dropped no call back number.

## 2022-01-29 NOTE — Telephone Encounter (Signed)
Noted, orders placed. 

## 2022-02-02 ENCOUNTER — Telehealth: Payer: Self-pay | Admitting: Primary Care

## 2022-02-02 DIAGNOSIS — A419 Sepsis, unspecified organism: Secondary | ICD-10-CM | POA: Diagnosis not present

## 2022-02-02 DIAGNOSIS — I110 Hypertensive heart disease with heart failure: Secondary | ICD-10-CM | POA: Diagnosis not present

## 2022-02-02 DIAGNOSIS — F0390 Unspecified dementia without behavioral disturbance: Secondary | ICD-10-CM | POA: Diagnosis not present

## 2022-02-02 DIAGNOSIS — I5032 Chronic diastolic (congestive) heart failure: Secondary | ICD-10-CM | POA: Diagnosis not present

## 2022-02-02 DIAGNOSIS — R1319 Other dysphagia: Secondary | ICD-10-CM

## 2022-02-02 DIAGNOSIS — R633 Feeding difficulties, unspecified: Secondary | ICD-10-CM

## 2022-02-02 DIAGNOSIS — F03918 Unspecified dementia, unspecified severity, with other behavioral disturbance: Secondary | ICD-10-CM

## 2022-02-02 DIAGNOSIS — J189 Pneumonia, unspecified organism: Secondary | ICD-10-CM | POA: Diagnosis not present

## 2022-02-02 DIAGNOSIS — N179 Acute kidney failure, unspecified: Secondary | ICD-10-CM | POA: Diagnosis not present

## 2022-02-02 NOTE — Telephone Encounter (Signed)
[place] calling to get orders for a Swallow Test through [facility]...  ? ?CB: [phone] Genre ? ?(Said she was checking in on this, but I saw no other note about this) ?

## 2022-02-02 NOTE — Telephone Encounter (Signed)
Patient was in hospital and sent home on soft diet. Speech would like to have a re evaluation done to see if patient has improved enough to go back on unrestricted diet.  ?

## 2022-02-02 NOTE — Telephone Encounter (Signed)
Home health called to report pt fell on Sunday. No bruises or anything, just some pain in left shoulder.  ?

## 2022-02-03 ENCOUNTER — Telehealth: Payer: Self-pay | Admitting: Primary Care

## 2022-02-03 NOTE — Telephone Encounter (Signed)
Forwarding to [place] , please advise  ?

## 2022-02-03 NOTE — Telephone Encounter (Signed)
Order placed

## 2022-02-03 NOTE — Telephone Encounter (Signed)
?  Encourage patient to contact the pharmacy for refills or they can request refills through [facility] ? ?Did the patient contact the pharmacy:  (Pharmacy contacted the patient) ? ? ?LAST APPOINTMENT DATE:  4.4.23 ? ?NEXT APPOINTMENT DATE: 4.18.23 ? ?MEDICATION: memantine (NAMENDA XR) 28 MG CP24 24 hr capsule ?             risperiDONE (RISPERDAL) 0.5 MG tablet ? ?Is the patient out of medication? Not sure, mail order pharmacy needed script called in so could fill for the patient ? ?If not, how much is left? N/a ? ?Is this a 90 day supply: n/a ? ?PHARMACY:   ?[place], [place] Phone:  [phone]  ?Fax:  [phone]  ?  ? ? ?Let patient know to contact pharmacy at the end of the day to make sure medication is ready. ? ?Please notify patient to allow 48-72 hours to process ?  ?

## 2022-02-04 ENCOUNTER — Other Ambulatory Visit: Payer: Self-pay | Admitting: Nurse Practitioner

## 2022-02-04 DIAGNOSIS — J189 Pneumonia, unspecified organism: Secondary | ICD-10-CM | POA: Diagnosis not present

## 2022-02-04 DIAGNOSIS — Z853 Personal history of malignant neoplasm of breast: Secondary | ICD-10-CM | POA: Diagnosis not present

## 2022-02-04 DIAGNOSIS — I5032 Chronic diastolic (congestive) heart failure: Secondary | ICD-10-CM | POA: Diagnosis not present

## 2022-02-04 DIAGNOSIS — F0390 Unspecified dementia without behavioral disturbance: Secondary | ICD-10-CM | POA: Diagnosis not present

## 2022-02-04 DIAGNOSIS — N3281 Overactive bladder: Secondary | ICD-10-CM | POA: Diagnosis not present

## 2022-02-04 DIAGNOSIS — H409 Unspecified glaucoma: Secondary | ICD-10-CM | POA: Diagnosis not present

## 2022-02-04 DIAGNOSIS — Z9181 History of falling: Secondary | ICD-10-CM | POA: Diagnosis not present

## 2022-02-04 DIAGNOSIS — Z9842 Cataract extraction status, left eye: Secondary | ICD-10-CM | POA: Diagnosis not present

## 2022-02-04 DIAGNOSIS — Z9841 Cataract extraction status, right eye: Secondary | ICD-10-CM | POA: Diagnosis not present

## 2022-02-04 DIAGNOSIS — F03918 Unspecified dementia, unspecified severity, with other behavioral disturbance: Secondary | ICD-10-CM

## 2022-02-04 DIAGNOSIS — A419 Sepsis, unspecified organism: Secondary | ICD-10-CM | POA: Diagnosis not present

## 2022-02-04 DIAGNOSIS — Z7982 Long term (current) use of aspirin: Secondary | ICD-10-CM | POA: Diagnosis not present

## 2022-02-04 DIAGNOSIS — I110 Hypertensive heart disease with heart failure: Secondary | ICD-10-CM | POA: Diagnosis not present

## 2022-02-04 DIAGNOSIS — N179 Acute kidney failure, unspecified: Secondary | ICD-10-CM | POA: Diagnosis not present

## 2022-02-04 DIAGNOSIS — Z90710 Acquired absence of both cervix and uterus: Secondary | ICD-10-CM | POA: Diagnosis not present

## 2022-02-04 MED ORDER — MEMANTINE HCL ER 28 MG PO CP24: ORAL_CAPSULE | ORAL | 0 refills | Status: DC

## 2022-02-04 MED ORDER — RISPERIDONE 0.5 MG PO TABS: 0.5000 mg | ORAL_TABLET | Freq: Every day | ORAL | 0 refills | Status: DC

## 2022-02-04 NOTE — Telephone Encounter (Signed)
Medication sent to mail order pharmacy  ?

## 2022-02-05 DIAGNOSIS — I5032 Chronic diastolic (congestive) heart failure: Secondary | ICD-10-CM | POA: Diagnosis not present

## 2022-02-05 DIAGNOSIS — F0390 Unspecified dementia without behavioral disturbance: Secondary | ICD-10-CM | POA: Diagnosis not present

## 2022-02-05 DIAGNOSIS — I110 Hypertensive heart disease with heart failure: Secondary | ICD-10-CM | POA: Diagnosis not present

## 2022-02-05 DIAGNOSIS — J189 Pneumonia, unspecified organism: Secondary | ICD-10-CM | POA: Diagnosis not present

## 2022-02-05 DIAGNOSIS — A419 Sepsis, unspecified organism: Secondary | ICD-10-CM | POA: Diagnosis not present

## 2022-02-05 DIAGNOSIS — N179 Acute kidney failure, unspecified: Secondary | ICD-10-CM | POA: Diagnosis not present

## 2022-02-05 NOTE — Addendum Note (Signed)
Addended by: Lesleigh Noe on: 02/05/2022 08:42 AM ? ? Modules accepted: Orders ? ?

## 2022-02-09 DIAGNOSIS — I110 Hypertensive heart disease with heart failure: Secondary | ICD-10-CM | POA: Diagnosis not present

## 2022-02-09 DIAGNOSIS — F0390 Unspecified dementia without behavioral disturbance: Secondary | ICD-10-CM | POA: Diagnosis not present

## 2022-02-09 DIAGNOSIS — N179 Acute kidney failure, unspecified: Secondary | ICD-10-CM | POA: Diagnosis not present

## 2022-02-09 DIAGNOSIS — J189 Pneumonia, unspecified organism: Secondary | ICD-10-CM | POA: Diagnosis not present

## 2022-02-09 DIAGNOSIS — I5032 Chronic diastolic (congestive) heart failure: Secondary | ICD-10-CM | POA: Diagnosis not present

## 2022-02-09 DIAGNOSIS — A419 Sepsis, unspecified organism: Secondary | ICD-10-CM | POA: Diagnosis not present

## 2022-02-11 ENCOUNTER — Encounter (HOSPITAL_COMMUNITY): Payer: Medicare Other

## 2022-02-11 ENCOUNTER — Ambulatory Visit (HOSPITAL_COMMUNITY): Admission: RE | Admit: 2022-02-11 | Payer: Medicare Other | Source: Ambulatory Visit

## 2022-02-11 DIAGNOSIS — J189 Pneumonia, unspecified organism: Secondary | ICD-10-CM | POA: Diagnosis not present

## 2022-02-11 DIAGNOSIS — N179 Acute kidney failure, unspecified: Secondary | ICD-10-CM | POA: Diagnosis not present

## 2022-02-11 DIAGNOSIS — A419 Sepsis, unspecified organism: Secondary | ICD-10-CM | POA: Diagnosis not present

## 2022-02-11 DIAGNOSIS — F0390 Unspecified dementia without behavioral disturbance: Secondary | ICD-10-CM | POA: Diagnosis not present

## 2022-02-11 DIAGNOSIS — I110 Hypertensive heart disease with heart failure: Secondary | ICD-10-CM | POA: Diagnosis not present

## 2022-02-11 DIAGNOSIS — I5032 Chronic diastolic (congestive) heart failure: Secondary | ICD-10-CM | POA: Diagnosis not present

## 2022-02-12 ENCOUNTER — Telehealth: Payer: Self-pay | Admitting: Primary Care

## 2022-02-12 DIAGNOSIS — I5032 Chronic diastolic (congestive) heart failure: Secondary | ICD-10-CM | POA: Diagnosis not present

## 2022-02-12 DIAGNOSIS — N179 Acute kidney failure, unspecified: Secondary | ICD-10-CM | POA: Diagnosis not present

## 2022-02-12 DIAGNOSIS — I110 Hypertensive heart disease with heart failure: Secondary | ICD-10-CM | POA: Diagnosis not present

## 2022-02-12 DIAGNOSIS — J189 Pneumonia, unspecified organism: Secondary | ICD-10-CM | POA: Diagnosis not present

## 2022-02-12 DIAGNOSIS — F0390 Unspecified dementia without behavioral disturbance: Secondary | ICD-10-CM | POA: Diagnosis not present

## 2022-02-12 DIAGNOSIS — A419 Sepsis, unspecified organism: Secondary | ICD-10-CM | POA: Diagnosis not present

## 2022-02-12 NOTE — Telephone Encounter (Signed)
Noted  

## 2022-02-12 NOTE — Telephone Encounter (Signed)
Hermenia Fiscal Regestered nurse [facility] called and she is discharging pt for Skilled nursing discipline only. Call back [phone] ?

## 2022-02-12 NOTE — Telephone Encounter (Signed)
FYI

## 2022-02-16 DIAGNOSIS — N179 Acute kidney failure, unspecified: Secondary | ICD-10-CM | POA: Diagnosis not present

## 2022-02-16 DIAGNOSIS — I110 Hypertensive heart disease with heart failure: Secondary | ICD-10-CM | POA: Diagnosis not present

## 2022-02-16 DIAGNOSIS — F0390 Unspecified dementia without behavioral disturbance: Secondary | ICD-10-CM | POA: Diagnosis not present

## 2022-02-16 DIAGNOSIS — I5032 Chronic diastolic (congestive) heart failure: Secondary | ICD-10-CM | POA: Diagnosis not present

## 2022-02-16 DIAGNOSIS — A419 Sepsis, unspecified organism: Secondary | ICD-10-CM | POA: Diagnosis not present

## 2022-02-16 DIAGNOSIS — Z20822 Contact with and (suspected) exposure to covid-19: Secondary | ICD-10-CM | POA: Diagnosis not present

## 2022-02-16 DIAGNOSIS — J189 Pneumonia, unspecified organism: Secondary | ICD-10-CM | POA: Diagnosis not present

## 2022-02-18 ENCOUNTER — Ambulatory Visit (HOSPITAL_COMMUNITY)
Admission: RE | Admit: 2022-02-18 | Discharge: 2022-02-18 | Disposition: A | Discharge status: Home / Self Care | Payer: Medicare Other | Source: Ambulatory Visit | Attending: Family Medicine | Admitting: Family Medicine

## 2022-02-18 DIAGNOSIS — R1319 Other dysphagia: Secondary | ICD-10-CM | POA: Diagnosis not present

## 2022-02-18 DIAGNOSIS — F03918 Unspecified dementia, unspecified severity, with other behavioral disturbance: Secondary | ICD-10-CM | POA: Insufficient documentation

## 2022-02-18 DIAGNOSIS — R1310 Dysphagia, unspecified: Secondary | ICD-10-CM | POA: Diagnosis not present

## 2022-02-18 DIAGNOSIS — R633 Feeding difficulties, unspecified: Secondary | ICD-10-CM

## 2022-02-18 NOTE — Therapy (Signed)
Modified Barium Swallow Progress Note ? ?Patient Details  ?Name: Nehemie Dorough ?MRN: [id number] ?Date of Birth: February 06, 1935 ? ?Today's Date: 02/18/2022 ? ?Modified Barium Swallow completed.  Full report located under Chart Review in the Imaging Section. ? ?Brief recommendations include the following: ? ?Clinical Impression ? Patient presents with an oropharyngeal swallow that is mildly impaired. During oral phase, patient exhibited decreased mastication efficiency and strength as well as mildly delayed anterior to posterior transit of solid boluses, difficulty with bolus cohesion with barium tablet and thin liquid barium, and premature spillage of thin liquids into vallecular sinus. During pharyngeal phase of swallow, patient exhibited swallow initiation delays to level of vallecular sinus with thin liquids, puree solids and regular solids. Only trace vallecular residuals observed after initial swallow of thin liquids and which cleared with subsequent swallows. No penetration or aspiration even with successive straw sips. Barium tablet transited pharyngeally and through esophagus without any observed difficulties. SLP suspects patient's main difficulty with swallow function is with oral phase and suspect she would become fatigued during course of meal if masticating and recommend to avoid hard/chewy/dense solid textures. ?  ?Swallow Evaluation Recommendations ? ?   ? ? SLP Diet Recommendations: Dysphagia 3 (Mech soft) solids;Thin liquid ? ? Liquid Administration via: Straw;Cup ? ? Medication Administration: Whole meds with liquid ? ?   ? ? Compensations: Slow rate;Small sips/bites;Minimize environmental distractions ? ?   ? ?   ? ?   ?Sonia Baller, MA, CCC-SLP ?Speech Therapy ? ? ?

## 2022-02-19 DIAGNOSIS — I110 Hypertensive heart disease with heart failure: Secondary | ICD-10-CM | POA: Diagnosis not present

## 2022-02-19 DIAGNOSIS — F0390 Unspecified dementia without behavioral disturbance: Secondary | ICD-10-CM | POA: Diagnosis not present

## 2022-02-19 DIAGNOSIS — A419 Sepsis, unspecified organism: Secondary | ICD-10-CM | POA: Diagnosis not present

## 2022-02-19 DIAGNOSIS — N179 Acute kidney failure, unspecified: Secondary | ICD-10-CM | POA: Diagnosis not present

## 2022-02-19 DIAGNOSIS — J189 Pneumonia, unspecified organism: Secondary | ICD-10-CM | POA: Diagnosis not present

## 2022-02-19 DIAGNOSIS — I5032 Chronic diastolic (congestive) heart failure: Secondary | ICD-10-CM | POA: Diagnosis not present

## 2022-02-23 DIAGNOSIS — I110 Hypertensive heart disease with heart failure: Secondary | ICD-10-CM | POA: Diagnosis not present

## 2022-02-23 DIAGNOSIS — N179 Acute kidney failure, unspecified: Secondary | ICD-10-CM | POA: Diagnosis not present

## 2022-02-23 DIAGNOSIS — A419 Sepsis, unspecified organism: Secondary | ICD-10-CM | POA: Diagnosis not present

## 2022-02-23 DIAGNOSIS — F0390 Unspecified dementia without behavioral disturbance: Secondary | ICD-10-CM | POA: Diagnosis not present

## 2022-02-23 DIAGNOSIS — J189 Pneumonia, unspecified organism: Secondary | ICD-10-CM | POA: Diagnosis not present

## 2022-02-23 DIAGNOSIS — I5032 Chronic diastolic (congestive) heart failure: Secondary | ICD-10-CM | POA: Diagnosis not present

## 2022-02-25 ENCOUNTER — Ambulatory Visit (INDEPENDENT_AMBULATORY_CARE_PROVIDER_SITE_OTHER): Payer: Medicare Other | Admitting: Primary Care

## 2022-02-25 ENCOUNTER — Encounter: Payer: Self-pay | Admitting: Primary Care

## 2022-02-25 DIAGNOSIS — N179 Acute kidney failure, unspecified: Secondary | ICD-10-CM | POA: Diagnosis not present

## 2022-02-25 DIAGNOSIS — I10 Essential (primary) hypertension: Secondary | ICD-10-CM

## 2022-02-25 DIAGNOSIS — I110 Hypertensive heart disease with heart failure: Secondary | ICD-10-CM | POA: Diagnosis not present

## 2022-02-25 DIAGNOSIS — E785 Hyperlipidemia, unspecified: Secondary | ICD-10-CM | POA: Diagnosis not present

## 2022-02-25 DIAGNOSIS — K219 Gastro-esophageal reflux disease without esophagitis: Secondary | ICD-10-CM | POA: Diagnosis not present

## 2022-02-25 DIAGNOSIS — F03918 Unspecified dementia, unspecified severity, with other behavioral disturbance: Secondary | ICD-10-CM | POA: Diagnosis not present

## 2022-02-25 DIAGNOSIS — F418 Other specified anxiety disorders: Secondary | ICD-10-CM | POA: Diagnosis not present

## 2022-02-25 DIAGNOSIS — N3281 Overactive bladder: Secondary | ICD-10-CM | POA: Diagnosis not present

## 2022-02-25 DIAGNOSIS — F0390 Unspecified dementia without behavioral disturbance: Secondary | ICD-10-CM | POA: Diagnosis not present

## 2022-02-25 DIAGNOSIS — J189 Pneumonia, unspecified organism: Secondary | ICD-10-CM | POA: Diagnosis not present

## 2022-02-25 DIAGNOSIS — A419 Sepsis, unspecified organism: Secondary | ICD-10-CM | POA: Diagnosis not present

## 2022-02-25 DIAGNOSIS — I5032 Chronic diastolic (congestive) heart failure: Secondary | ICD-10-CM | POA: Diagnosis not present

## 2022-02-25 MED ORDER — TOVIAZ 8 MG PO TB24: 8.0000 mg | ORAL_TABLET | Freq: Every day | ORAL | 3 refills | Status: DC

## 2022-02-25 MED ORDER — HYDROXYZINE HCL 10 MG PO TABS
10.0000 mg | ORAL_TABLET | Freq: Every day | ORAL | 0 refills | Status: DC | PRN
Start: 2022-02-25 — End: 2022-06-15

## 2022-02-25 MED ORDER — SERTRALINE HCL 50 MG PO TABS: 50.0000 mg | ORAL_TABLET | Freq: Every day | ORAL | 3 refills | Status: DC

## 2022-02-25 MED ORDER — SERTRALINE HCL 25 MG PO TABS: 25.0000 mg | ORAL_TABLET | Freq: Every day | ORAL | 3 refills | Status: DC

## 2022-02-25 MED ORDER — ATORVASTATIN CALCIUM 40 MG PO TABS: ORAL_TABLET | ORAL | 3 refills | Status: DC

## 2022-02-25 MED ORDER — LISINOPRIL 5 MG PO TABS: 5.0000 mg | ORAL_TABLET | Freq: Every day | ORAL | 3 refills | Status: DC

## 2022-02-25 MED ORDER — OMEPRAZOLE 20 MG PO CPDR: 20.0000 mg | DELAYED_RELEASE_CAPSULE | Freq: Every day | ORAL | 3 refills | Status: DC

## 2022-02-25 NOTE — Assessment & Plan Note (Signed)
Deteriorated.  Again, strongly advised her daughter contact her neurologist for a follow up visit.  Continue Namenda XR 28 mg daily, donepezil 10 mg daily, risperidone 0.5 mg HS.  Add hydroxyzine 10-20 mg HS for now.

## 2022-02-25 NOTE — Patient Instructions (Signed)
Please call Dr. Lannie Fields office for an appointment.  You can take hydroxyzine 1 or 2 tablets at bedtime for anxiety/sleep.  Dr. Melrose Nakayama prescribes:  -risperidone 0.5 mg -Namenda XR 28 mg  -donepezil 10 mg  It was a pleasure to see you today!

## 2022-02-25 NOTE — Progress Notes (Signed)
Subjective:    Patient ID: Caroline French, female    DOB: 1935-03-10, 87 y.o.   MRN: [id number]  HPI  Caroline French is a very pleasant 87 y.o. female with a history of hypertension, sleep apnea, GERD, overactive bladder, dementia with behavorial disturbance, prediabetes, glaucoma, altered mental status who presents today with her daughter to discuss dementia with behavorial disturbance.   She is also needing her prescriptions transferred over to [facility] from [facility].   Currently managed on donepezil 10 mg daily and memantine XR 28 mg daily, risperidone 0.5 mg HS, Zoloft 75 mg daily. Following with neurology, Dr. Melrose Nakayama, no recent visit as she had to cancel their last visit due to her hospitalization.   Her daughter endorses that her mother is "screaming in the middle of the night", seeing things falling form the ceiling in her room. She is getting up during the night often because of her hallucinations and her daughter is afraid she may fall. The patient did not recognize her daughter a few days ago.   Symptoms only occur at night, never during the day. Her family has not tired providing hydroxyzine for night time anxiety or sleep. Her family has yet to contact neurology for follow up.   Review of Systems  Constitutional:  Negative for fatigue.  Respiratory:  Negative for cough and shortness of breath.   Psychiatric/Behavioral:  Positive for agitation, behavioral problems, confusion and sleep disturbance. The patient is nervous/anxious.         Past Medical History:  Diagnosis Date   Breast cancer [facility]) 8588'F   left breast   Dementia with behavioral disturbance ([place])    Diverticulitis    Essential hypertension    Glaucoma    Hallucinations    Overactive bladder     Social History   Socioeconomic History   Marital status: Widowed    Spouse name: Not on file   Number of children: Not on file   Years of education: Not on file   Highest education level: Not on file  Occupational  History   Not on file  Tobacco Use   Smoking status: Never   Smokeless tobacco: Never  Vaping Use   Vaping Use: Never used  Substance and Sexual Activity   Alcohol use: Never   Drug use: Never   Sexual activity: Not Currently  Other Topics Concern   Not on file  Social History Narrative   Widow.   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   Moved from [place].   Social Determinants of Health   Financial Resource Strain: Low Risk    Difficulty of Paying Living Expenses: Not hard at all  Food Insecurity: No Food Insecurity   Worried About Charity fundraiser in the Last Year: Never true   [place] in the Last Year: Never true  Transportation Needs: No Transportation Needs   Lack of Transportation (Medical): No   Lack of Transportation (Non-Medical): No  Physical Activity: Insufficiently Active   Days of Exercise per Week: 7 days   Minutes of Exercise per Session: 10 min  Stress: No Stress Concern Present   Feeling of Stress : Not at all  Social Connections: Not on file  Intimate Partner Violence: Not At Risk   Fear of Current or Ex-Partner: No   Emotionally Abused: No   Physically Abused: No   Sexually Abused: No    Past Surgical History:  Procedure Laterality Date   ABDOMINAL HYSTERECTOMY     BREAST BIOPSY  BREAST LUMPECTOMY Left 1990's   CATARACT EXTRACTION, BILATERAL  08/012020    No family history on file.  Allergies  Allergen Reactions   Penicillins Rash    Did it involve swelling of the face/tongue/throat, SOB, or low BP? Yes Did it involve sudden or severe rash/hives, skin peeling, or any reaction on the inside of your mouth or nose? Unk Did you need to seek medical attention at a hospital or doctor's office? Unk When did it last happen? "I was young" If all above answers are "NO", may proceed with cephalosporin use.     Current Outpatient Medications on File Prior to Visit  Medication Sig Dispense Refill   Ascorbic Acid (VITAMIN C PO) Take 1 tablet  by mouth daily.     aspirin (ASPIRIN LOW DOSE) 81 MG EC tablet TAKE 1 TABLET BY MOUTH ONCE DAILY 90 tablet 3   atorvastatin (LIPITOR) 40 MG tablet TAKE 1 TABLET BY MOUTH DAILY FOR CHOLESTEROL 90 tablet 3   CALCIUM CITRATE + D 315-200 MG-UNIT tablet Take 1 tablet by mouth daily.  11   docusate sodium (COLACE) 100 MG capsule Take 100 mg by mouth daily as needed for mild constipation.     donepezil (ARICEPT) 10 MG tablet Take 1 tablet (10 mg total) by mouth at bedtime. For memory. 90 tablet 3   ferrous sulfate 325 (65 FE) MG tablet Take 325 mg by mouth daily.     FIBER ADULT GUMMIES PO Take 1 each by mouth daily.     fluticasone (FLONASE) 50 MCG/ACT nasal spray INSTILL 1 SPRAY IN EACH NOSTRIL TWICE DAILY AS NEEDED FOR ALLERGIES (Patient taking differently: Place 1 spray into both nostrils 2 (two) times daily as needed for allergies.) 48 g 1   hydrOXYzine (ATARAX) 10 MG tablet Take 1-2 tablets (10-20 mg total) by mouth daily as needed (for travel and anxiety).     latanoprost (XALATAN) 0.005 % ophthalmic solution Place 1 drop into both eyes at bedtime.     lisinopril (ZESTRIL) 5 MG tablet TAKE 1 TABLET BY MOUTH EVERY MORNING FOR BLOOD PRESSURE 90 tablet 3   loratadine (CLARITIN) 10 MG tablet Take 10 mg by mouth daily.     memantine (NAMENDA XR) 28 MG CP24 24 hr capsule TAKE ONE (1) CAPSULE BY MOUTH ONCE DAILY FOR MEMORY 90 capsule 0   Multiple Vitamin (MULTI-VITAMINS) TABS Take 1 tablet by mouth daily.  11   Omega-3 Fatty Acids (SEA-OMEGA 30) 1200 MG CAPS Take 1 capsule by mouth daily.  11   omeprazole (PRILOSEC) 20 MG capsule TAKE 1 CAPSULE BY MOUTH EVERY DAY FOR HEARTBURN (Patient taking differently: Take 20 mg by mouth daily.) 90 capsule 3   polyethylene glycol powder (GLYCOLAX/MIRALAX) 17 GM/SCOOP powder Take 17 g by mouth daily as needed for mild constipation. Pt is  taking at every day     risperiDONE (RISPERDAL) 0.5 MG tablet Take 1 tablet (0.5 mg total) by mouth at bedtime. 90 tablet 0    sertraline (ZOLOFT) 25 MG tablet Take 1 tablet (25 mg total) by mouth daily. Take with '50mg'$  for total daily dose of '75mg'$      sertraline (ZOLOFT) 50 MG tablet TAKE 1 TABLET BY MOUTH DAILY FOR ANXIETY. TAKE WITH '25MG'$  TABLET 90 tablet 3   TOVIAZ 8 MG TB24 tablet TAKE 1 TABLET BY MOUTH ONCE DAILY FOR OVERACTIVE BLADDER 90 tablet 3   No current facility-administered medications on file prior to visit.    BP 110/70   Pulse 73  Ht '5\' 7"'$  (1.702 m)   Wt 185 lb 11.2 oz (84.2 kg)   SpO2 98%   BMI 29.08 kg/m  Objective:   Physical Exam Cardiovascular:     Rate and Rhythm: Normal rate and regular rhythm.  Pulmonary:     Effort: Pulmonary effort is normal.     Breath sounds: Normal breath sounds.  Musculoskeletal:     Cervical back: Neck supple.  Skin:    General: Skin is warm and dry.  Neurological:     Mental Status: She is alert.     Comments: Follows commands          Assessment & Plan:      This visit occurred during the SARS-CoV-2 public health emergency.  Safety protocols were in place, including screening questions prior to the visit, additional usage of staff PPE, and extensive cleaning of exam room while observing appropriate contact time as indicated for disinfecting solutions.

## 2022-02-26 DIAGNOSIS — A419 Sepsis, unspecified organism: Secondary | ICD-10-CM | POA: Diagnosis not present

## 2022-02-26 DIAGNOSIS — N179 Acute kidney failure, unspecified: Secondary | ICD-10-CM | POA: Diagnosis not present

## 2022-02-26 DIAGNOSIS — I110 Hypertensive heart disease with heart failure: Secondary | ICD-10-CM | POA: Diagnosis not present

## 2022-02-26 DIAGNOSIS — J189 Pneumonia, unspecified organism: Secondary | ICD-10-CM | POA: Diagnosis not present

## 2022-02-26 DIAGNOSIS — I5032 Chronic diastolic (congestive) heart failure: Secondary | ICD-10-CM | POA: Diagnosis not present

## 2022-02-26 DIAGNOSIS — F0390 Unspecified dementia without behavioral disturbance: Secondary | ICD-10-CM | POA: Diagnosis not present

## 2022-03-02 ENCOUNTER — Telehealth: Payer: Self-pay

## 2022-03-02 DIAGNOSIS — F514 Sleep terrors [night terrors]: Secondary | ICD-10-CM | POA: Diagnosis not present

## 2022-03-02 DIAGNOSIS — E119 Type 2 diabetes mellitus without complications: Secondary | ICD-10-CM | POA: Diagnosis not present

## 2022-03-02 DIAGNOSIS — I110 Hypertensive heart disease with heart failure: Secondary | ICD-10-CM | POA: Diagnosis not present

## 2022-03-02 DIAGNOSIS — F03A0 Unspecified dementia, mild, without behavioral disturbance, psychotic disturbance, mood disturbance, and anxiety: Secondary | ICD-10-CM | POA: Diagnosis not present

## 2022-03-02 DIAGNOSIS — N179 Acute kidney failure, unspecified: Secondary | ICD-10-CM | POA: Diagnosis not present

## 2022-03-02 DIAGNOSIS — R413 Other amnesia: Secondary | ICD-10-CM | POA: Diagnosis not present

## 2022-03-02 DIAGNOSIS — R296 Repeated falls: Secondary | ICD-10-CM | POA: Diagnosis not present

## 2022-03-02 DIAGNOSIS — I5032 Chronic diastolic (congestive) heart failure: Secondary | ICD-10-CM | POA: Diagnosis not present

## 2022-03-02 DIAGNOSIS — F0390 Unspecified dementia without behavioral disturbance: Secondary | ICD-10-CM | POA: Diagnosis not present

## 2022-03-02 DIAGNOSIS — Z8659 Personal history of other mental and behavioral disorders: Secondary | ICD-10-CM | POA: Diagnosis not present

## 2022-03-02 DIAGNOSIS — A419 Sepsis, unspecified organism: Secondary | ICD-10-CM | POA: Diagnosis not present

## 2022-03-02 DIAGNOSIS — J189 Pneumonia, unspecified organism: Secondary | ICD-10-CM | POA: Diagnosis not present

## 2022-03-02 NOTE — Telephone Encounter (Signed)
Caroline French from [facility] called requesting verbal orders for:  PT 1 x week for 8 weeks   Ok to leave message on secured VM. [phone]

## 2022-03-03 NOTE — Telephone Encounter (Signed)
Approved.  

## 2022-03-03 NOTE — Telephone Encounter (Signed)
Detailed message left on secured voicemail as requested.

## 2022-03-04 DIAGNOSIS — N179 Acute kidney failure, unspecified: Secondary | ICD-10-CM | POA: Diagnosis not present

## 2022-03-04 DIAGNOSIS — J189 Pneumonia, unspecified organism: Secondary | ICD-10-CM | POA: Diagnosis not present

## 2022-03-04 DIAGNOSIS — A419 Sepsis, unspecified organism: Secondary | ICD-10-CM | POA: Diagnosis not present

## 2022-03-04 DIAGNOSIS — F0390 Unspecified dementia without behavioral disturbance: Secondary | ICD-10-CM | POA: Diagnosis not present

## 2022-03-04 DIAGNOSIS — I110 Hypertensive heart disease with heart failure: Secondary | ICD-10-CM | POA: Diagnosis not present

## 2022-03-04 DIAGNOSIS — I5032 Chronic diastolic (congestive) heart failure: Secondary | ICD-10-CM | POA: Diagnosis not present

## 2022-03-06 DIAGNOSIS — Z9181 History of falling: Secondary | ICD-10-CM | POA: Diagnosis not present

## 2022-03-06 DIAGNOSIS — N3281 Overactive bladder: Secondary | ICD-10-CM | POA: Diagnosis not present

## 2022-03-06 DIAGNOSIS — F0390 Unspecified dementia without behavioral disturbance: Secondary | ICD-10-CM | POA: Diagnosis not present

## 2022-03-06 DIAGNOSIS — Z853 Personal history of malignant neoplasm of breast: Secondary | ICD-10-CM | POA: Diagnosis not present

## 2022-03-06 DIAGNOSIS — Z9841 Cataract extraction status, right eye: Secondary | ICD-10-CM | POA: Diagnosis not present

## 2022-03-06 DIAGNOSIS — Z7982 Long term (current) use of aspirin: Secondary | ICD-10-CM | POA: Diagnosis not present

## 2022-03-06 DIAGNOSIS — J189 Pneumonia, unspecified organism: Secondary | ICD-10-CM | POA: Diagnosis not present

## 2022-03-06 DIAGNOSIS — I110 Hypertensive heart disease with heart failure: Secondary | ICD-10-CM | POA: Diagnosis not present

## 2022-03-06 DIAGNOSIS — A419 Sepsis, unspecified organism: Secondary | ICD-10-CM | POA: Diagnosis not present

## 2022-03-06 DIAGNOSIS — Z90710 Acquired absence of both cervix and uterus: Secondary | ICD-10-CM | POA: Diagnosis not present

## 2022-03-06 DIAGNOSIS — N179 Acute kidney failure, unspecified: Secondary | ICD-10-CM | POA: Diagnosis not present

## 2022-03-06 DIAGNOSIS — Z9842 Cataract extraction status, left eye: Secondary | ICD-10-CM | POA: Diagnosis not present

## 2022-03-06 DIAGNOSIS — I5032 Chronic diastolic (congestive) heart failure: Secondary | ICD-10-CM | POA: Diagnosis not present

## 2022-03-06 DIAGNOSIS — H409 Unspecified glaucoma: Secondary | ICD-10-CM | POA: Diagnosis not present

## 2022-03-11 DIAGNOSIS — I110 Hypertensive heart disease with heart failure: Secondary | ICD-10-CM | POA: Diagnosis not present

## 2022-03-11 DIAGNOSIS — N179 Acute kidney failure, unspecified: Secondary | ICD-10-CM | POA: Diagnosis not present

## 2022-03-11 DIAGNOSIS — J189 Pneumonia, unspecified organism: Secondary | ICD-10-CM | POA: Diagnosis not present

## 2022-03-11 DIAGNOSIS — A419 Sepsis, unspecified organism: Secondary | ICD-10-CM | POA: Diagnosis not present

## 2022-03-11 DIAGNOSIS — I5032 Chronic diastolic (congestive) heart failure: Secondary | ICD-10-CM | POA: Diagnosis not present

## 2022-03-11 DIAGNOSIS — F0390 Unspecified dementia without behavioral disturbance: Secondary | ICD-10-CM | POA: Diagnosis not present

## 2022-03-14 DIAGNOSIS — Z9842 Cataract extraction status, left eye: Secondary | ICD-10-CM

## 2022-03-14 DIAGNOSIS — I110 Hypertensive heart disease with heart failure: Secondary | ICD-10-CM | POA: Diagnosis not present

## 2022-03-14 DIAGNOSIS — Z853 Personal history of malignant neoplasm of breast: Secondary | ICD-10-CM | POA: Diagnosis not present

## 2022-03-14 DIAGNOSIS — Z9181 History of falling: Secondary | ICD-10-CM

## 2022-03-14 DIAGNOSIS — Z7982 Long term (current) use of aspirin: Secondary | ICD-10-CM | POA: Diagnosis not present

## 2022-03-14 DIAGNOSIS — J189 Pneumonia, unspecified organism: Secondary | ICD-10-CM | POA: Diagnosis not present

## 2022-03-14 DIAGNOSIS — Z90710 Acquired absence of both cervix and uterus: Secondary | ICD-10-CM | POA: Diagnosis not present

## 2022-03-14 DIAGNOSIS — Z9841 Cataract extraction status, right eye: Secondary | ICD-10-CM | POA: Diagnosis not present

## 2022-03-14 DIAGNOSIS — I5032 Chronic diastolic (congestive) heart failure: Secondary | ICD-10-CM | POA: Diagnosis not present

## 2022-03-14 DIAGNOSIS — N179 Acute kidney failure, unspecified: Secondary | ICD-10-CM | POA: Diagnosis not present

## 2022-03-14 DIAGNOSIS — F0390 Unspecified dementia without behavioral disturbance: Secondary | ICD-10-CM | POA: Diagnosis not present

## 2022-03-14 DIAGNOSIS — H409 Unspecified glaucoma: Secondary | ICD-10-CM | POA: Diagnosis not present

## 2022-03-14 DIAGNOSIS — N3281 Overactive bladder: Secondary | ICD-10-CM | POA: Diagnosis not present

## 2022-03-14 DIAGNOSIS — A419 Sepsis, unspecified organism: Secondary | ICD-10-CM | POA: Diagnosis not present

## 2022-03-19 DIAGNOSIS — F0390 Unspecified dementia without behavioral disturbance: Secondary | ICD-10-CM | POA: Diagnosis not present

## 2022-03-19 DIAGNOSIS — I110 Hypertensive heart disease with heart failure: Secondary | ICD-10-CM | POA: Diagnosis not present

## 2022-03-19 DIAGNOSIS — A419 Sepsis, unspecified organism: Secondary | ICD-10-CM | POA: Diagnosis not present

## 2022-03-19 DIAGNOSIS — J189 Pneumonia, unspecified organism: Secondary | ICD-10-CM | POA: Diagnosis not present

## 2022-03-19 DIAGNOSIS — I5032 Chronic diastolic (congestive) heart failure: Secondary | ICD-10-CM | POA: Diagnosis not present

## 2022-03-19 DIAGNOSIS — N179 Acute kidney failure, unspecified: Secondary | ICD-10-CM | POA: Diagnosis not present

## 2022-03-25 DIAGNOSIS — I5032 Chronic diastolic (congestive) heart failure: Secondary | ICD-10-CM | POA: Diagnosis not present

## 2022-03-25 DIAGNOSIS — I110 Hypertensive heart disease with heart failure: Secondary | ICD-10-CM | POA: Diagnosis not present

## 2022-03-25 DIAGNOSIS — F0390 Unspecified dementia without behavioral disturbance: Secondary | ICD-10-CM | POA: Diagnosis not present

## 2022-03-25 DIAGNOSIS — N179 Acute kidney failure, unspecified: Secondary | ICD-10-CM | POA: Diagnosis not present

## 2022-03-25 DIAGNOSIS — J189 Pneumonia, unspecified organism: Secondary | ICD-10-CM | POA: Diagnosis not present

## 2022-03-25 DIAGNOSIS — A419 Sepsis, unspecified organism: Secondary | ICD-10-CM | POA: Diagnosis not present

## 2022-03-30 ENCOUNTER — Telehealth: Payer: Self-pay

## 2022-03-30 ENCOUNTER — Ambulatory Visit (INDEPENDENT_AMBULATORY_CARE_PROVIDER_SITE_OTHER): Payer: Medicare Other

## 2022-03-30 VITALS — Ht 67.0 in | Wt 185.0 lb

## 2022-03-30 DIAGNOSIS — J189 Pneumonia, unspecified organism: Secondary | ICD-10-CM | POA: Diagnosis not present

## 2022-03-30 DIAGNOSIS — Z0000 Encounter for general adult medical examination without abnormal findings: Secondary | ICD-10-CM | POA: Diagnosis not present

## 2022-03-30 DIAGNOSIS — F0390 Unspecified dementia without behavioral disturbance: Secondary | ICD-10-CM | POA: Diagnosis not present

## 2022-03-30 DIAGNOSIS — N179 Acute kidney failure, unspecified: Secondary | ICD-10-CM | POA: Diagnosis not present

## 2022-03-30 DIAGNOSIS — I110 Hypertensive heart disease with heart failure: Secondary | ICD-10-CM | POA: Diagnosis not present

## 2022-03-30 DIAGNOSIS — F03918 Unspecified dementia, unspecified severity, with other behavioral disturbance: Secondary | ICD-10-CM

## 2022-03-30 DIAGNOSIS — A419 Sepsis, unspecified organism: Secondary | ICD-10-CM | POA: Diagnosis not present

## 2022-03-30 DIAGNOSIS — I5032 Chronic diastolic (congestive) heart failure: Secondary | ICD-10-CM | POA: Diagnosis not present

## 2022-03-30 NOTE — Telephone Encounter (Signed)
Spoke with pt's daugther, [name], today for pt's AWV. [name] asks if Rx could be sent to [facility] [address], for an toilet seat booster?   [name] also asks if her mother can be referred to a Podiatrist for foot and nail care? Thank you.

## 2022-03-30 NOTE — Progress Notes (Signed)
Subjective:   Caroline French is a 87 y.o. female who presents for Medicare Annual (Subsequent) preventive examination. Virtual Visit via Telephone Note  I connected with  Caroline French on 03/30/22 at 11:15 AM EDT by telephone and verified that I am speaking with the correct person using two identifiers.  Location: Patient: HOME Provider: [facility] Persons participating in the virtual visit: patient/Nurse Health Advisor   I discussed the limitations, risks, security and privacy concerns of performing an evaluation and management service by telephone and the availability of in person appointments. The patient expressed understanding and agreed to proceed.  Interactive audio and video telecommunications were attempted between this nurse and patient, however failed, due to patient having technical difficulties OR patient did not have access to video capability.  We continued and completed visit with audio only.  Some vital signs may be absent or patient reported.   Chriss Driver, LPN  Review of Systems     Cardiac Risk Factors include: advanced age (>58mn, >>51women);dyslipidemia;hypertension;sedentary lifestyle;Other (see comment), Risk factor comments: DEMENTIA     Objective:    Today's Vitals   03/30/22 1111  Weight: 185 lb (83.9 kg)  Height: '5\' 7"'$  (1.702 m)   Body mass index is 28.98 kg/m.     03/30/2022   11:19 AM 12/22/2021    9:08 PM 03/27/2021   11:48 AM 03/04/2020    9:47 AM 01/24/2019    2:48 PM 12/03/2018    5:56 PM  Advanced Directives  Does Patient Have a Medical Advance Directive? Yes No No No No No  Type of [place]amed[place]of [place] will       Copy of H[place] Chart? No - copy requested       Would patient like information on creating a medical advance directive?  No - Patient declined No - Patient declined Yes (MAU/Ambulatory/Procedural Areas - Information given) No - Patient declined No - Patient declined     Current Medications (verified) Outpatient Encounter Medications as of 03/30/2022  Medication Sig   Ascorbic Acid (VITAMIN C PO) Take 1 tablet by mouth daily.   aspirin (ASPIRIN LOW DOSE) 81 MG EC tablet TAKE 1 TABLET BY MOUTH ONCE DAILY   atorvastatin (LIPITOR) 40 MG tablet TAKE 1 TABLET BY MOUTH DAILY FOR CHOLESTEROL   Calcium Citrate-Vitamin D3 315-6.25 MG-MCG TABS Take 1 tablet by mouth daily.   docusate sodium (COLACE) 100 MG capsule Take 100 mg by mouth daily as needed for mild constipation.   donepezil (ARICEPT) 10 MG tablet Take 1 tablet (10 mg total) by mouth at bedtime. For memory.   ferrous sulfate 325 (65 FE) MG tablet Take 325 mg by mouth daily.   fluticasone (FLONASE) 50 MCG/ACT nasal spray INSTILL 1 SPRAY IN EACH NOSTRIL TWICE DAILY AS NEEDED FOR ALLERGIES (Patient taking differently: Place 1 spray into both nostrils 2 (two) times daily as needed for allergies.)   hydrOXYzine (ATARAX) 10 MG tablet Take 1-2 tablets (10-20 mg total) by mouth daily as needed for anxiety (for travel and anxiety).   latanoprost (XALATAN) 0.005 % ophthalmic solution Place 1 drop into both eyes at bedtime.   lisinopril (ZESTRIL) 5 MG tablet Take 1 tablet (5 mg total) by mouth daily. for blood pressure.   loratadine (CLARITIN) 10 MG tablet Take 10 mg by mouth daily.   memantine (NAMENDA XR) 28 MG CP24 24 hr capsule Take by mouth.   Multiple Vitamin (MULTI-VITAMINS) TABS Take 1 tablet by mouth  daily.   Omega-3 Fatty Acids (SEA-OMEGA 30) 1200 MG CAPS Take 1 capsule by mouth daily.   omeprazole (PRILOSEC) 20 MG capsule Take 1 capsule (20 mg total) by mouth daily. For heartburn   polyethylene glycol powder (GLYCOLAX/MIRALAX) 17 GM/SCOOP powder Take 17 g by mouth daily as needed for mild constipation. Pt is  taking at every day   REGULOID 51.7 % POWD Take by mouth.   risperiDONE (RISPERDAL) 0.5 MG tablet Take 1 tablet (0.5 mg total) by mouth at bedtime.   sertraline (ZOLOFT) 25 MG tablet Take 1 tablet (25  mg total) by mouth daily. Take with '50mg'$  dose.   sertraline (ZOLOFT) 50 MG tablet Take 1 tablet (50 mg total) by mouth daily. For anxiety. Take with 25 mg dose.   TOVIAZ 8 MG TB24 tablet Take 1 tablet (8 mg total) by mouth daily. For overactive bladder   [DISCONTINUED] CALCIUM CITRATE + D 315-200 MG-UNIT tablet Take 1 tablet by mouth daily.   [DISCONTINUED] FIBER ADULT GUMMIES PO Take 1 each by mouth daily.   [DISCONTINUED] memantine (NAMENDA XR) 28 MG CP24 24 hr capsule TAKE ONE (1) CAPSULE BY MOUTH ONCE DAILY FOR MEMORY   No facility-administered encounter medications on file as of 03/30/2022.    Allergies (verified) Penicillins   History: Past Medical History:  Diagnosis Date   Breast cancer ([place]) 8676'H   left breast   Dementia with behavioral disturbance ([place])    Diverticulitis    Essential hypertension    Glaucoma    Hallucinations    Overactive bladder    Past Surgical History:  Procedure Laterality Date   ABDOMINAL HYSTERECTOMY     BREAST BIOPSY     BREAST LUMPECTOMY Left 1990's   CATARACT EXTRACTION, BILATERAL  08/012020   History reviewed. No pertinent family history. Social History   Socioeconomic History   Marital status: Widowed    Spouse name: Not on file   Number of children: Not on file   Years of education: Not on file   Highest education level: Not on file  Occupational History   Not on file  Tobacco Use   Smoking status: Never   Smokeless tobacco: Never  Vaping Use   Vaping Use: Never used  Substance and Sexual Activity   Alcohol use: Never   Drug use: Never   Sexual activity: Not Currently  Other Topics Concern   Not on file  Social History Narrative   Widow.   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   Moved from [place].   Social Determinants of Health   Financial Resource Strain: Low Risk  (03/30/2022)   Overall Financial Resource Strain (CARDIA)    Difficulty of Paying Living Expenses: Not hard at all  Food Insecurity: No Food Insecurity  (03/30/2022)   Hunger Vital Sign    Worried About Running Out of Food in the Last Year: Never true    Ran Out of Food in the Last Year: Never true  Transportation Needs: No Transportation Needs (03/30/2022)   PRAPARE - Hydrologist (Medical): No    Lack of Transportation (Non-Medical): No  Physical Activity: Insufficiently Active (03/30/2022)   Exercise Vital Sign    Days of Exercise per Week: 1 day    Minutes of Exercise per Session: 60 min  Stress: No Stress Concern Present (03/30/2022)   [place]    Feeling of Stress : Not at all  Social Connections: [place] (03/30/2022)  Social Licensed conveyancer [NHANES]    Frequency of Communication with Friends and Family: More than three times a week    Frequency of Social Gatherings with Friends and Family: More than three times a week    Attends Religious Services: More than 4 times per year    Active Member of [employer] or Organizations: Yes    Attends Music therapist: More than 4 times per year    Marital Status: Married    Tobacco Counseling Counseling given: Not Answered   Clinical Intake:  Pre-visit preparation completed: Yes  Pain : No/denies pain     BMI - recorded: 28.98 Nutritional Status: BMI 25 -29 Overweight Nutritional Risks: None Diabetes: No  How often do you need to have someone help you when you read instructions, pamphlets, or other written materials from your doctor or pharmacy?: 3 - Sometimes  Diabetic?NO  Interpreter Needed?: No  Information entered by :: mj Eliyahu Bille, lpn   Activities of Daily Living    03/30/2022   11:22 AM 12/26/2021    4:00 PM  In your present state of health, do you have any difficulty performing the following activities:  Hearing? 0 0  Vision? 0 0  Difficulty concentrating or making decisions? 1 1  Comment Dementia dx.   Walking or climbing stairs? 0  1  Dressing or bathing? 0 1  Doing errands, shopping? 0 1  Preparing Food and eating ? N   Using the Toilet? N   In the past six months, have you accidently leaked urine? N   Do you have problems with loss of bowel control? Y   Managing your Medications? Y   Managing your Finances? N   Housekeeping or managing your Housekeeping? N     Patient Care Team: Pleas Koch, NP as PCP - General (Internal Medicine)  Indicate any recent Medical Services you may have received from other than [facility] providers in the past year (date may be approximate).     Assessment:   This is a routine wellness examination for Arliene.  Hearing/Vision screen Hearing Screening - Comments:: No hearing issues.  Vision Screening - Comments:: Glasses. 01/2022. [employer].  Dietary issues and exercise activities discussed: Current Exercise Habits: Home exercise routine, Type of exercise: stretching;walking (Pt has home PT coming in one day per week.), Time (Minutes): 60, Frequency (Times/Week): 1, Weekly Exercise (Minutes/Week): 60, Intensity: Mild, Exercise limited by: cardiac condition(s);neurologic condition(s)   Goals Addressed             This Visit's Progress    Patient Stated   On track    Starting 01/24/2019, I will continue to take medications as prescribed.      Patient Stated   On track    03/04/2020, I will maintain and continue medications as prescribed.     Patient Stated   Not on track    03/27/2021, I will continue to walk 3-4 days a week for 15 minutes       Depression Screen    03/30/2022   11:16 AM 02/25/2022   11:23 AM 01/12/2022   11:36 AM 03/27/2021   11:48 AM 02/19/2021    2:14 PM 03/04/2020    9:51 AM 01/24/2019    2:50 PM  PHQ 2/9 Scores  PHQ - 2 Score 0 0 0 0 0 0 0  PHQ- 9 Score    0 1 0 0    Fall Risk    03/30/2022   11:19 AM  02/25/2022   11:23 AM 03/27/2021   11:48 AM 03/04/2020    9:49 AM 01/24/2019    2:50 PM  Fall Risk   Falls in the past year? 1 0 0 0  0  Number falls in past yr: 0  0 0   Injury with Fall? 0  0 0   Risk for fall due to : History of fall(s);Impaired balance/gait;Mental status change  Medication side effect Medication side effect   Follow up Falls prevention discussed Falls evaluation completed Falls evaluation completed;Falls prevention discussed Falls evaluation completed;Falls prevention discussed     FALL RISK PREVENTION PERTAINING TO THE HOME:  Any stairs in or around the home? Yes  If so, are there any without handrails? No  Home free of loose throw rugs in walkways, pet beds, electrical cords, etc? Yes  Adequate lighting in your home to reduce risk of falls? Yes   ASSISTIVE DEVICES UTILIZED TO PREVENT FALLS:  Life alert? No  Use of a cane, walker or w/c? No  Grab bars in the bathroom? No  Shower chair or bench in shower? No  Elevated toilet seat or a handicapped toilet? Yes   TIMED UP AND GO:  Was the test performed? No .  Phone visit.  Cognitive Function: Patient has current diagnosis of cognitive impairment. 03/30/2022. Patient is followed by neurology for ongoing assessment. 03/30/2022. Patient is unable to complete screening 6CIT or MMSE.      03/27/2021   11:50 AM 03/04/2020    9:55 AM 01/24/2019    2:56 PM  MMSE - Mini Mental State Exam  Not completed: Unable to complete Unable to complete Unable to complete        Immunizations Immunization History  Administered Date(s) Administered   Fluad Quad(high Dose 65+) 08/11/2021   Influenza Split 08/03/2004, 07/18/2009, 06/23/2011   Influenza, High Dose Seasonal PF 06/26/2012, 08/13/2013, 07/05/2014, 08/26/2015, 08/26/2016, 09/09/2017, 08/27/2019   Influenza,inj,Quad PF,6+ Mos 07/21/2018   Moderna Sars-Covid-2 Vaccination 11/13/2019, 12/11/2019   PPD Test 05/31/2018   Pneumococcal Conjugate-13 11/20/2013   Pneumococcal Polysaccharide-23 08/19/2011, 07/21/2018    TDAP status: Due, Education has been provided regarding the importance of this  vaccine. Advised may receive this vaccine at local pharmacy or Health Dept. Aware to provide a copy of the vaccination record if obtained from local pharmacy or Health Dept. Verbalized acceptance and understanding.  Flu Vaccine status: Up to date  Pneumococcal vaccine status: Up to date  Covid-19 vaccine status: Completed vaccines  Qualifies for Shingles Vaccine? Yes   Zostavax completed No   Shingrix Completed?: No.    Education has been provided regarding the importance of this vaccine. Patient has been advised to call insurance company to determine out of pocket expense if they have not yet received this vaccine. Advised may also receive vaccine at local pharmacy or Health Dept. Verbalized acceptance and understanding.  Screening Tests Health Maintenance  Topic Date Due   COVID-19 Vaccine (3 - Moderna series) 04/15/2022 (Originally 02/05/2020)   TETANUS/TDAP  10/08/2022 (Originally 08/18/1954)   Zoster Vaccines- Shingrix (1 of 2) 10/08/2022 (Originally 08/19/1987)   INFLUENZA VACCINE  05/11/2022   Pneumonia Vaccine 29+ Years old  Completed   DEXA SCAN  Completed   HPV VACCINES  Aged Out    Health Maintenance  There are no preventive care reminders to display for this patient.   Colorectal cancer screening: No longer required.   Mammogram status: Completed 01/29/2022. Repeat every year  Bone Density status: Ordered NO LONGER REQUIRED. Pt  provided with contact info and advised to call to schedule appt.  Lung Cancer Screening: (Low Dose CT Chest recommended if Age 81-80 years, 30 pack-year currently smoking OR have quit w/in 15years.) does not qualify.   Additional Screening:  Hepatitis C Screening: does not qualify.  Vision Screening: Recommended annual ophthalmology exams for early detection of glaucoma and other disorders of the eye. Is the patient up to date with their annual eye exam?  Yes  Who is the provider or what is the name of the office in which the patient attends  annual eye exams? [facility]. If pt is not established with a provider, would they like to be referred to a provider to establish care? No .   Dental Screening: Recommended annual dental exams for proper oral hygiene  Community Resource Referral / Chronic Care Management: CRR required this visit?  No   CCM required this visit?  No      Plan:     I have personally reviewed and noted the following in the patient's chart:   Medical and social history Use of alcohol, tobacco or illicit drugs  Current medications and supplements including opioid prescriptions.  Functional ability and status Nutritional status Physical activity Advanced directives List of other physicians Hospitalizations, surgeries, and ER visits in previous 12 months Vitals Screenings to include cognitive, depression, and falls Referrals and appointments  In addition, I have reviewed and discussed with patient certain preventive protocols, quality metrics, and best practice recommendations. A written personalized care plan for preventive services as well as general preventive health recommendations were provided to patient.     Chriss Driver, LPN   [id number]/[id number]   Nurse Notes: Discussed Shingrix and how to obtain.

## 2022-03-30 NOTE — Patient Instructions (Signed)
Caroline French , Thank you for taking time to come for your Medicare Wellness Visit. I appreciate your ongoing commitment to your health goals. Please review the following plan we discussed and let me know if I can assist you in the future.   Screening recommendations/referrals: Colonoscopy: No longer required. Mammogram: Done 01/29/2022. Repeat annually  Bone Density: No longer required. Recommended yearly ophthalmology/optometry visit for glaucoma screening and checkup Recommended yearly dental visit for hygiene and checkup  Vaccinations: Influenza vaccine: Done 08/11/2021 Repeat annually  Pneumococcal vaccine: done 11/20/2013, 07/21/2018, 08/19/2011. Tdap vaccine: Due every 10 years. Shingles vaccine: Discussed.   Covid-19:Done 12/11/2019 and 11/13/2019.  Advanced directives: Please bring a copy of your health care power of attorney and living will to the office to be added to your chart at your convenience.   Conditions/risks identified: Aim for 30 minutes of exercise or brisk walking, 6-8 glasses of water, and 5 servings of fruits and vegetables each day.   Next appointment: Follow up in one year for your annual wellness visit 2024.   Preventive Care 83 Years and Older, Female Preventive care refers to lifestyle choices and visits with your health care provider that can promote health and wellness. What does preventive care include? A yearly physical exam. This is also called an annual well check. Dental exams once or twice a year. Routine eye exams. Ask your health care provider how often you should have your eyes checked. Personal lifestyle choices, including: Daily care of your teeth and gums. Regular physical activity. Eating a healthy diet. Avoiding tobacco and drug use. Limiting alcohol use. Practicing safe sex. Taking low-dose aspirin every day. Taking vitamin and mineral supplements as recommended by your health care provider. What happens during an annual well check? The  services and screenings done by your health care provider during your annual well check will depend on your age, overall health, lifestyle risk factors, and family history of disease. Counseling  Your health care provider may ask you questions about your: Alcohol use. Tobacco use. Drug use. Emotional well-being. Home and relationship well-being. Sexual activity. Eating habits. History of falls. Memory and ability to understand (cognition). Work and work Statistician. Reproductive health. Screening  You may have the following tests or measurements: Height, weight, and BMI. Blood pressure. Lipid and cholesterol levels. These may be checked every 5 years, or more frequently if you are over 55 years old. Skin check. Lung cancer screening. You may have this screening every year starting at age 74 if you have a 30-pack-year history of smoking and currently smoke or have quit within the past 15 years. Fecal occult blood test (FOBT) of the stool. You may have this test every year starting at age 54. Flexible sigmoidoscopy or colonoscopy. You may have a sigmoidoscopy every 5 years or a colonoscopy every 10 years starting at age 50. Hepatitis C blood test. Hepatitis B blood test. Sexually transmitted disease (STD) testing. Diabetes screening. This is done by checking your blood sugar (glucose) after you have not eaten for a while (fasting). You may have this done every 1-3 years. Bone density scan. This is done to screen for osteoporosis. You may have this done starting at age 66. Mammogram. This may be done every 1-2 years. Talk to your health care provider about how often you should have regular mammograms. Talk with your health care provider about your test results, treatment options, and if necessary, the need for more tests. Vaccines  Your health care provider may recommend certain vaccines, such  as: Influenza vaccine. This is recommended every year. Tetanus, diphtheria, and acellular  pertussis (Tdap, Td) vaccine. You may need a Td booster every 10 years. Zoster vaccine. You may need this after age 8. Pneumococcal 13-valent conjugate (PCV13) vaccine. One dose is recommended after age 90. Pneumococcal polysaccharide (PPSV23) vaccine. One dose is recommended after age 64. Talk to your health care provider about which screenings and vaccines you need and how often you need them. This information is not intended to replace advice given to you by your health care provider. Make sure you discuss any questions you have with your health care provider. Document Released: 10/24/2015 Document Revised: 06/16/2016 Document Reviewed: 07/29/2015 Elsevier Interactive Patient Education  2017 [place] Prevention in the Home Falls can cause injuries. They can happen to people of all ages. There are many things you can do to make your home safe and to help prevent falls. What can I do on the outside of my home? Regularly fix the edges of walkways and driveways and fix any cracks. Remove anything that might make you trip as you walk through a door, such as a raised step or threshold. Trim any bushes or trees on the path to your home. Use bright outdoor lighting. Clear any walking paths of anything that might make someone trip, such as rocks or tools. Regularly check to see if handrails are loose or broken. Make sure that both sides of any steps have handrails. Any raised decks and porches should have guardrails on the edges. Have any leaves, snow, or ice cleared regularly. Use sand or salt on walking paths during winter. Clean up any spills in your garage right away. This includes oil or grease spills. What can I do in the bathroom? Use night lights. Install grab bars by the toilet and in the tub and shower. Do not use towel bars as grab bars. Use non-skid mats or decals in the tub or shower. If you need to sit down in the shower, use a plastic, non-slip stool. Keep the floor  dry. Clean up any water that spills on the floor as soon as it happens. Remove soap buildup in the tub or shower regularly. Attach bath mats securely with double-sided non-slip rug tape. Do not have throw rugs and other things on the floor that can make you trip. What can I do in the bedroom? Use night lights. Make sure that you have a light by your bed that is easy to reach. Do not use any sheets or blankets that are too big for your bed. They should not hang down onto the floor. Have a firm chair that has side arms. You can use this for support while you get dressed. Do not have throw rugs and other things on the floor that can make you trip. What can I do in the kitchen? Clean up any spills right away. Avoid walking on wet floors. Keep items that you use a lot in easy-to-reach places. If you need to reach something above you, use a strong step stool that has a grab bar. Keep electrical cords out of the way. Do not use floor polish or wax that makes floors slippery. If you must use wax, use non-skid floor wax. Do not have throw rugs and other things on the floor that can make you trip. What can I do with my stairs? Do not leave any items on the stairs. Make sure that there are handrails on both sides of the stairs and use  them. Fix handrails that are broken or loose. Make sure that handrails are as long as the stairways. Check any carpeting to make sure that it is firmly attached to the stairs. Fix any carpet that is loose or worn. Avoid having throw rugs at the top or bottom of the stairs. If you do have throw rugs, attach them to the floor with carpet tape. Make sure that you have a light switch at the top of the stairs and the bottom of the stairs. If you do not have them, ask someone to add them for you. What else can I do to help prevent falls? Wear shoes that: Do not have high heels. Have rubber bottoms. Are comfortable and fit you well. Are closed at the toe. Do not wear  sandals. If you use a stepladder: Make sure that it is fully opened. Do not climb a closed stepladder. Make sure that both sides of the stepladder are locked into place. Ask someone to hold it for you, if possible. Clearly mark and make sure that you can see: Any grab bars or handrails. First and last steps. Where the edge of each step is. Use tools that help you move around (mobility aids) if they are needed. These include: Canes. Walkers. Scooters. Crutches. Turn on the lights when you go into a dark area. Replace any light bulbs as soon as they burn out. Set up your furniture so you have a clear path. Avoid moving your furniture around. If any of your floors are uneven, fix them. If there are any pets around you, be aware of where they are. Review your medicines with your doctor. Some medicines can make you feel dizzy. This can increase your chance of falling. Ask your doctor what other things that you can do to help prevent falls. This information is not intended to replace advice given to you by your health care provider. Make sure you discuss any questions you have with your health care provider. Document Released: 07/24/2009 Document Revised: 03/04/2016 Document Reviewed: 11/01/2014 Elsevier Interactive Patient Education  2017 [employer].

## 2022-03-31 ENCOUNTER — Other Ambulatory Visit: Payer: Self-pay

## 2022-03-31 DIAGNOSIS — N3281 Overactive bladder: Secondary | ICD-10-CM

## 2022-03-31 DIAGNOSIS — F03918 Unspecified dementia, unspecified severity, with other behavioral disturbance: Secondary | ICD-10-CM

## 2022-03-31 NOTE — Telephone Encounter (Addendum)
Can you help regarding the raised toilet seat? Dx: Dementia, overactive bladder  Referral placed for podiatry.

## 2022-03-31 NOTE — Addendum Note (Signed)
Addended by: Pleas Koch on: 03/31/2022 03:10 AM   Modules accepted: Orders

## 2022-03-31 NOTE — Telephone Encounter (Signed)
Order for DME paced. No further action needed at this time.

## 2022-04-05 DIAGNOSIS — Z9181 History of falling: Secondary | ICD-10-CM | POA: Diagnosis not present

## 2022-04-05 DIAGNOSIS — N3281 Overactive bladder: Secondary | ICD-10-CM | POA: Diagnosis not present

## 2022-04-05 DIAGNOSIS — I110 Hypertensive heart disease with heart failure: Secondary | ICD-10-CM | POA: Diagnosis not present

## 2022-04-05 DIAGNOSIS — H409 Unspecified glaucoma: Secondary | ICD-10-CM | POA: Diagnosis not present

## 2022-04-05 DIAGNOSIS — Z9841 Cataract extraction status, right eye: Secondary | ICD-10-CM | POA: Diagnosis not present

## 2022-04-05 DIAGNOSIS — A419 Sepsis, unspecified organism: Secondary | ICD-10-CM | POA: Diagnosis not present

## 2022-04-05 DIAGNOSIS — Z853 Personal history of malignant neoplasm of breast: Secondary | ICD-10-CM | POA: Diagnosis not present

## 2022-04-05 DIAGNOSIS — F0390 Unspecified dementia without behavioral disturbance: Secondary | ICD-10-CM | POA: Diagnosis not present

## 2022-04-05 DIAGNOSIS — Z7982 Long term (current) use of aspirin: Secondary | ICD-10-CM | POA: Diagnosis not present

## 2022-04-05 DIAGNOSIS — I5032 Chronic diastolic (congestive) heart failure: Secondary | ICD-10-CM | POA: Diagnosis not present

## 2022-04-05 DIAGNOSIS — Z9842 Cataract extraction status, left eye: Secondary | ICD-10-CM | POA: Diagnosis not present

## 2022-04-05 DIAGNOSIS — N179 Acute kidney failure, unspecified: Secondary | ICD-10-CM | POA: Diagnosis not present

## 2022-04-05 DIAGNOSIS — J189 Pneumonia, unspecified organism: Secondary | ICD-10-CM | POA: Diagnosis not present

## 2022-04-05 DIAGNOSIS — Z90710 Acquired absence of both cervix and uterus: Secondary | ICD-10-CM | POA: Diagnosis not present

## 2022-04-07 ENCOUNTER — Other Ambulatory Visit: Payer: Self-pay | Admitting: Primary Care

## 2022-04-07 DIAGNOSIS — F418 Other specified anxiety disorders: Secondary | ICD-10-CM

## 2022-04-07 DIAGNOSIS — F03918 Unspecified dementia, unspecified severity, with other behavioral disturbance: Secondary | ICD-10-CM

## 2022-04-07 NOTE — Telephone Encounter (Signed)
Received refill request for hydroxyzine for which family uses as needed during travel for anxiety.  I sent 90 pills in early May, do they really need a refill?  If so, how often are the giving her the hydroxyzine for anxiety?

## 2022-04-08 ENCOUNTER — Ambulatory Visit (INDEPENDENT_AMBULATORY_CARE_PROVIDER_SITE_OTHER): Payer: Medicare Other | Admitting: Podiatry

## 2022-04-08 DIAGNOSIS — A419 Sepsis, unspecified organism: Secondary | ICD-10-CM | POA: Diagnosis not present

## 2022-04-08 DIAGNOSIS — M79674 Pain in right toe(s): Secondary | ICD-10-CM

## 2022-04-08 DIAGNOSIS — B351 Tinea unguium: Secondary | ICD-10-CM

## 2022-04-08 DIAGNOSIS — F0390 Unspecified dementia without behavioral disturbance: Secondary | ICD-10-CM | POA: Diagnosis not present

## 2022-04-08 DIAGNOSIS — M79675 Pain in left toe(s): Secondary | ICD-10-CM

## 2022-04-08 DIAGNOSIS — I5032 Chronic diastolic (congestive) heart failure: Secondary | ICD-10-CM | POA: Diagnosis not present

## 2022-04-08 DIAGNOSIS — I110 Hypertensive heart disease with heart failure: Secondary | ICD-10-CM | POA: Diagnosis not present

## 2022-04-08 DIAGNOSIS — N179 Acute kidney failure, unspecified: Secondary | ICD-10-CM | POA: Diagnosis not present

## 2022-04-08 DIAGNOSIS — J189 Pneumonia, unspecified organism: Secondary | ICD-10-CM | POA: Diagnosis not present

## 2022-04-12 NOTE — Telephone Encounter (Signed)
Called family did not need refill at this time was sent by pharmacy.

## 2022-04-15 DIAGNOSIS — I110 Hypertensive heart disease with heart failure: Secondary | ICD-10-CM | POA: Diagnosis not present

## 2022-04-15 DIAGNOSIS — J189 Pneumonia, unspecified organism: Secondary | ICD-10-CM | POA: Diagnosis not present

## 2022-04-15 DIAGNOSIS — F0390 Unspecified dementia without behavioral disturbance: Secondary | ICD-10-CM | POA: Diagnosis not present

## 2022-04-15 DIAGNOSIS — A419 Sepsis, unspecified organism: Secondary | ICD-10-CM | POA: Diagnosis not present

## 2022-04-15 DIAGNOSIS — N179 Acute kidney failure, unspecified: Secondary | ICD-10-CM | POA: Diagnosis not present

## 2022-04-15 DIAGNOSIS — I5032 Chronic diastolic (congestive) heart failure: Secondary | ICD-10-CM | POA: Diagnosis not present

## 2022-04-15 NOTE — Progress Notes (Signed)
  Subjective:  Patient ID: Caroline French, female    DOB: [birth year],  MRN: [id number]  Chief Complaint  Patient presents with   Nail Problem   87 y.o. female returns for the above complaint.  Patient presents with thickened elongated dystrophic toenails x10 mild pain on palpation.  Patient would like to have them debrided as she is not able to do it herself.  She denies any other acute complaints  Objective:  There were no vitals filed for this visit. Podiatric Exam: Vascular: dorsalis pedis and posterior tibial pulses are palpable bilateral. Capillary return is immediate. Temperature gradient is WNL. Skin turgor WNL  Sensorium: Normal Semmes Weinstein monofilament test. Normal tactile sensation bilaterally. Nail Exam: Pt has thick disfigured discolored nails with subungual debris noted bilateral entire nail hallux through fifth toenails.  Pain on palpation to the nails. Ulcer Exam: There is no evidence of ulcer or pre-ulcerative changes or infection. Orthopedic Exam: Muscle tone and strength are WNL. No limitations in general ROM. No crepitus or effusions noted.  Skin: No Porokeratosis. No infection or ulcers    Assessment & Plan:   1. Pain due to onychomycosis of toenails of both feet     Patient was evaluated and treated and all questions answered.  Onychomycosis with pain  -Nails palliatively debrided as below. -Educated on self-care  Procedure: Nail Debridement Rationale: pain  Type of Debridement: manual, sharp debridement. Instrumentation: Nail nipper, rotary burr. Number of Nails: 10  Procedures and Treatment: Consent by patient was obtained for treatment procedures. The patient understood the discussion of treatment and procedures well. All questions were answered thoroughly reviewed. Debridement of mycotic and hypertrophic toenails, 1 through 5 bilateral and clearing of subungual debris. No ulceration, no infection noted.  Return Visit-Office Procedure: Patient instructed  to return to the office for a follow up visit 3 months for continued evaluation and treatment.  Boneta Lucks, DPM    No follow-ups on file.

## 2022-04-22 DIAGNOSIS — J189 Pneumonia, unspecified organism: Secondary | ICD-10-CM | POA: Diagnosis not present

## 2022-04-22 DIAGNOSIS — F0390 Unspecified dementia without behavioral disturbance: Secondary | ICD-10-CM | POA: Diagnosis not present

## 2022-04-22 DIAGNOSIS — I5032 Chronic diastolic (congestive) heart failure: Secondary | ICD-10-CM | POA: Diagnosis not present

## 2022-04-22 DIAGNOSIS — I110 Hypertensive heart disease with heart failure: Secondary | ICD-10-CM | POA: Diagnosis not present

## 2022-04-22 DIAGNOSIS — A419 Sepsis, unspecified organism: Secondary | ICD-10-CM | POA: Diagnosis not present

## 2022-04-22 DIAGNOSIS — N179 Acute kidney failure, unspecified: Secondary | ICD-10-CM | POA: Diagnosis not present

## 2022-04-23 ENCOUNTER — Ambulatory Visit (INDEPENDENT_AMBULATORY_CARE_PROVIDER_SITE_OTHER): Payer: Medicare Other | Admitting: Primary Care

## 2022-04-23 ENCOUNTER — Encounter: Payer: Self-pay | Admitting: Primary Care

## 2022-04-23 DIAGNOSIS — H6120 Impacted cerumen, unspecified ear: Secondary | ICD-10-CM | POA: Insufficient documentation

## 2022-04-23 DIAGNOSIS — F03918 Unspecified dementia, unspecified severity, with other behavioral disturbance: Secondary | ICD-10-CM

## 2022-04-23 DIAGNOSIS — H6123 Impacted cerumen, bilateral: Secondary | ICD-10-CM

## 2022-04-23 NOTE — Patient Instructions (Signed)
Try taking hydroxyzine at bedtime for sleep. Take 1 or 2 tablets before bedtime.  Please contact your neurologist if no improvement.   You can use Debrox drops for future ear wax build up if needed.  It was a pleasure to see you today!   Earwax Buildup, Adult The ears produce a substance called earwax that helps keep bacteria out of the ear and protects the skin in the ear canal. Occasionally, earwax can build up in the ear and cause discomfort or hearing loss. What are the causes? This condition is caused by a buildup of earwax. Ear canals are self-cleaning. Ear wax is made in the outer part of the ear canal and generally falls out in small amounts over time. When the self-cleaning mechanism is not working, earwax builds up and can cause decreased hearing and discomfort. Attempting to clean ears with cotton swabs can push the earwax deep into the ear canal and cause decreased hearing and pain. What increases the risk? This condition is more likely to develop in people who: Clean their ears often with cotton swabs. Pick at their ears. Use earplugs or in-ear headphones often, or wear hearing aids. The following factors may also make you more likely to develop this condition: Being female. Being of older age. Naturally producing more earwax. Having narrow ear canals. Having earwax that is overly thick or sticky. Having excess hair in the ear canal. Having eczema. Being dehydrated. What are the signs or symptoms? Symptoms of this condition include: Reduced or muffled hearing. A feeling of fullness in the ear or feeling that the ear is plugged. Fluid coming from the ear. Ear pain or an itchy ear. Ringing in the ear. Coughing. Balance problems. An obvious piece of earwax that can be seen inside the ear canal. How is this diagnosed? This condition may be diagnosed based on: Your symptoms. Your medical history. An ear exam. During the exam, your health care provider will look into  your ear with an instrument called an otoscope. You may have tests, including a hearing test. How is this treated? This condition may be treated by: Using ear drops to soften the earwax. Having the earwax removed by a health care provider. The health care provider may: Flush the ear with water. Use an instrument that has a loop on the end (curette). Use a suction device. Having surgery to remove the wax buildup. This may be done in severe cases. Follow these instructions at home:  Take over-the-counter and prescription medicines only as told by your health care provider. Do not put any objects, including cotton swabs, into your ear. You can clean the opening of your ear canal with a washcloth or facial tissue. Follow instructions from your health care provider about cleaning your ears. Do not overclean your ears. Drink enough fluid to keep your urine pale yellow. This will help to thin the earwax. Keep all follow-up visits as told. If earwax builds up in your ears often or if you use hearing aids, consider seeing your health care provider for routine, preventive ear cleanings. Ask your health care provider how often you should schedule your cleanings. If you have hearing aids, clean them according to instructions from the manufacturer and your health care provider. Contact a health care provider if: You have ear pain. You develop a fever. You have pus or other fluid coming from your ear. You have hearing loss. You have ringing in your ears that does not go away. You feel like the room is  spinning (vertigo). Your symptoms do not improve with treatment. Get help right away if: You have bleeding from the affected ear. You have severe ear pain. Summary Earwax can build up in the ear and cause discomfort or hearing loss. The most common symptoms of this condition include reduced or muffled hearing, a feeling of fullness in the ear, or feeling that the ear is plugged. This condition may be  diagnosed based on your symptoms, your medical history, and an ear exam. This condition may be treated by using ear drops to soften the earwax or by having the earwax removed by a health care provider. Do not put any objects, including cotton swabs, into your ear. You can clean the opening of your ear canal with a washcloth or facial tissue. This information is not intended to replace advice given to you by your health care provider. Make sure you discuss any questions you have with your health care provider. Document Revised: 01/15/2020 Document Reviewed: 01/15/2020 Elsevier Patient Education  [place].

## 2022-04-23 NOTE — Assessment & Plan Note (Addendum)
Noted on exam today.  Patient and daughter consent to irrigation. Bilateral canals irrigated. Patient tolerated well up to a point. She became uncomfortable during irrigation so we had to discontinue. Residual wax remaining in bilateral canals, about 50% of cerumen was removed.  Discussed use of Debrox drops with patient's daughter. Discussed that we can see her back in a few weeks for continued removal.

## 2022-04-23 NOTE — Assessment & Plan Note (Signed)
Progressing.  Reviewed neurology notes from May 2023 through [place]. Continue Risperdal 0.75 mg weekly, Zoloft 75 mg daily, Namenda XR 28 mg daily, Aricept 10 mg daily.  Add hydroxyzine 10-20 mg HS.  Her daughter will update if no improvement.

## 2022-04-23 NOTE — Progress Notes (Signed)
Subjective:    Patient ID: Caroline French, female    DOB: 16-Nov-1934, 87 y.o.   MRN: [id number]  Insomnia    Caroline French is a very pleasant 87 y.o. female with a history of hypertension, sleep apnea, dementia with behavioral disturbance, overactive bladder, glaucoma, prediabetes who presents today with her daughter to discuss sleep disturbance.  Her daughter provides most of the information for HPI.  Currently managed on sertraline 75 mg daily, Aricept 10 mg daily, Namenda XR 28 mg daily, Risperdal 0.75 mg at bedtime.  Following with neurology, last visit was in May 2023.  During this visit her Risperdal was increased to 0.75 mg at bedtime for ongoing memory loss, night terrors.  All other medications were continued.  Today her daughter endorses decreased hearing, hard of hearing, the TV has been turned up very loudly, music has been turned up loudly. She questions if her mother has a cerumen impaction.   She continues to wake several times nightly wanting to walk the dog, wanting to get up and dressed for the day at 2 am, trying to clean her bedroom floors during the night. Her memory continues to decline. Sometimes she will sleep through the night. Her daughter denies falls.   She does have a prescription for hydroxyzine for which her family will provide her during car rides for anxiety. This is effective. Her daughter has not tried this for sleep.   Review of Systems  Constitutional:  Negative for fever.  Genitourinary:  Negative for frequency and hematuria.  Psychiatric/Behavioral:  Positive for agitation and confusion. The patient is nervous/anxious and has insomnia.          Past Medical History:  Diagnosis Date   Breast cancer [facility]) [address]   left breast   Dementia with behavioral disturbance ([place])    Diverticulitis    Essential hypertension    Glaucoma    Hallucinations    Overactive bladder     Social History   Socioeconomic History   Marital status: Widowed     Spouse name: Not on file   Number of children: Not on file   Years of education: Not on file   Highest education level: Not on file  Occupational History   Not on file  Tobacco Use   Smoking status: Never   Smokeless tobacco: Never  Vaping Use   Vaping Use: Never used  Substance and Sexual Activity   Alcohol use: Never   Drug use: Never   Sexual activity: Not Currently  Other Topics Concern   Not on file  Social History Narrative   Widow.   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   Moved from [place].   Social Determinants of Health   Financial Resource Strain: Low Risk  (03/30/2022)   Overall Financial Resource Strain (CARDIA)    Difficulty of Paying Living Expenses: Not hard at all  Food Insecurity: No Food Insecurity (03/30/2022)   Hunger Vital Sign    Worried About Running Out of Food in the Last Year: Never true    Ran Out of Food in the Last Year: Never true  Transportation Needs: No Transportation Needs (03/30/2022)   PRAPARE - Hydrologist (Medical): No    Lack of Transportation (Non-Medical): No  Physical Activity: Insufficiently Active (03/30/2022)   Exercise Vital Sign    Days of Exercise per Week: 1 day    Minutes of Exercise per Session: 60 min  Stress: No Stress Concern Present (03/30/2022)  [place]    Feeling of Stress : Not at all  Social Connections: Socially Integrated (03/30/2022)   Social Connection and Isolation Panel [NHANES]    Frequency of Communication with Friends and Family: More than three times a week    Frequency of Social Gatherings with Friends and Family: More than three times a week    Attends Religious Services: More than 4 times per year    Active Member of [employer] or Organizations: Yes    Attends Music therapist: More than 4 times per year    Marital Status: Married  Human resources officer Violence: Not At Risk (03/30/2022)   Humiliation,  Afraid, Rape, and Kick questionnaire    Fear of Current or Ex-Partner: No    Emotionally Abused: No    Physically Abused: No    Sexually Abused: No    Past Surgical History:  Procedure Laterality Date   ABDOMINAL HYSTERECTOMY     BREAST BIOPSY     BREAST LUMPECTOMY Left 1990's   CATARACT EXTRACTION, BILATERAL  08/012020    History reviewed. No pertinent family history.  Allergies  Allergen Reactions   Penicillins Rash    Did it involve swelling of the face/tongue/throat, SOB, or low BP? Yes Did it involve sudden or severe rash/hives, skin peeling, or any reaction on the inside of your mouth or nose? Unk Did you need to seek medical attention at a hospital or doctor's office? Unk When did it last happen? "I was young" If all above answers are "NO", may proceed with cephalosporin use.     Current Outpatient Medications on File Prior to Visit  Medication Sig Dispense Refill   aspirin (ASPIRIN LOW DOSE) 81 MG EC tablet TAKE 1 TABLET BY MOUTH ONCE DAILY 90 tablet 3   atorvastatin (LIPITOR) 40 MG tablet TAKE 1 TABLET BY MOUTH DAILY FOR CHOLESTEROL 90 tablet 3   Calcium Citrate-Vitamin D3 315-6.25 MG-MCG TABS Take 1 tablet by mouth daily.     docusate sodium (COLACE) 100 MG capsule Take 100 mg by mouth daily as needed for mild constipation.     donepezil (ARICEPT) 10 MG tablet Take 1 tablet (10 mg total) by mouth at bedtime. For memory. 90 tablet 3   ferrous sulfate 325 (65 FE) MG tablet Take 325 mg by mouth daily.     fluticasone (FLONASE) 50 MCG/ACT nasal spray INSTILL 1 SPRAY IN EACH NOSTRIL TWICE DAILY AS NEEDED FOR ALLERGIES (Patient taking differently: Place 1 spray into both nostrils 2 (two) times daily as needed for allergies.) 48 g 1   hydrOXYzine (ATARAX) 10 MG tablet Take 1-2 tablets (10-20 mg total) by mouth daily as needed for anxiety (for travel and anxiety). 90 tablet 0   latanoprost (XALATAN) 0.005 % ophthalmic solution Place 1 drop into both eyes at bedtime.      lisinopril (ZESTRIL) 5 MG tablet Take 1 tablet (5 mg total) by mouth daily. for blood pressure. 90 tablet 3   loratadine (CLARITIN) 10 MG tablet Take 10 mg by mouth daily.     memantine (NAMENDA XR) 28 MG CP24 24 hr capsule Take by mouth.     Multiple Vitamin (MULTI-VITAMINS) TABS Take 1 tablet by mouth daily.  11   Omega-3 Fatty Acids (SEA-OMEGA 30) 1200 MG CAPS Take 1 capsule by mouth daily.  11   omeprazole (PRILOSEC) 20 MG capsule Take 1 capsule (20 mg total) by mouth daily. For heartburn 90 capsule 3   polyethylene  glycol powder (GLYCOLAX/MIRALAX) 17 GM/SCOOP powder Take 17 g by mouth daily as needed for mild constipation. Pt is  taking at every day     REGULOID 51.7 % POWD Take by mouth.     risperiDONE (RISPERDAL) 0.5 MG tablet Take 1 tablet (0.5 mg total) by mouth at bedtime. 90 tablet 0   sertraline (ZOLOFT) 25 MG tablet Take 1 tablet (25 mg total) by mouth daily. Take with '50mg'$  dose. 90 tablet 3   sertraline (ZOLOFT) 50 MG tablet Take 1 tablet (50 mg total) by mouth daily. For anxiety. Take with 25 mg dose. 90 tablet 3   TOVIAZ 8 MG TB24 tablet Take 1 tablet (8 mg total) by mouth daily. For overactive bladder 90 tablet 3   Ascorbic Acid (VITAMIN C PO) Take 1 tablet by mouth daily. (Patient not taking: Reported on 04/23/2022)     No current facility-administered medications on file prior to visit.    BP 110/62   Pulse 66   Temp 98.6 F (37 C) (Oral)   Ht '5\' 7"'$  (1.702 m)   Wt 186 lb (84.4 kg)   SpO2 95%   BMI 29.13 kg/m  Objective:   Physical Exam HENT:     Right Ear: There is impacted cerumen.     Left Ear: There is impacted cerumen.  Cardiovascular:     Rate and Rhythm: Normal rate and regular rhythm.  Pulmonary:     Effort: Pulmonary effort is normal.     Breath sounds: Normal breath sounds.  Musculoskeletal:     Cervical back: Neck supple.  Skin:    General: Skin is warm and dry.           Assessment & Plan:   Problem List Items Addressed This Visit        Nervous and Auditory   Dementia with behavioral disturbance ([place])    Progressing.  Reviewed neurology notes from May 2023 through [place]. Continue Risperdal 0.75 mg weekly, Zoloft 75 mg daily, Namenda XR 28 mg daily, Aricept 10 mg daily.  Add hydroxyzine 10-20 mg HS.  Her daughter will update if no improvement.       Cerumen impaction    Noted on exam today.  Patient and daughter consent to irrigation. Bilateral canals irrigated. Patient tolerated well up to a point. She became uncomfortable during irrigation so we had to discontinue. Residual wax remaining in bilateral canals, about 50% of cerumen was removed.  Discussed use of Debrox drops with patient's daughter. Discussed that we can see her back in a few weeks for continued removal.           Pleas Koch, NP

## 2022-04-29 DIAGNOSIS — N179 Acute kidney failure, unspecified: Secondary | ICD-10-CM | POA: Diagnosis not present

## 2022-04-29 DIAGNOSIS — A419 Sepsis, unspecified organism: Secondary | ICD-10-CM | POA: Diagnosis not present

## 2022-04-29 DIAGNOSIS — I110 Hypertensive heart disease with heart failure: Secondary | ICD-10-CM | POA: Diagnosis not present

## 2022-04-29 DIAGNOSIS — F0390 Unspecified dementia without behavioral disturbance: Secondary | ICD-10-CM | POA: Diagnosis not present

## 2022-04-29 DIAGNOSIS — I5032 Chronic diastolic (congestive) heart failure: Secondary | ICD-10-CM | POA: Diagnosis not present

## 2022-04-29 DIAGNOSIS — J189 Pneumonia, unspecified organism: Secondary | ICD-10-CM | POA: Diagnosis not present

## 2022-06-09 ENCOUNTER — Telehealth: Payer: Self-pay | Admitting: Primary Care

## 2022-06-09 NOTE — Telephone Encounter (Signed)
Type of forms received:Medical Exam Report  Routed OF:BPZWCHE T  Paperwork received by : Gwynn Burly   Individual made aware of 3-5 business day turn around (Y/N): Y  Form completed and patient made aware of charges(Y/N): Y   Faxed to :   Form location: Place in PCP [employer]

## 2022-06-09 NOTE — Telephone Encounter (Signed)
Called daughter she is going to reach out to [facility] and see if form needs to be filled out before Caroline French is back in the office. If so she will call office to make appointment.   Have reviewed with [name] and appointment will need to be made with her if needs before  9/20. I have ppw in folder holding until she calls back.

## 2022-06-11 NOTE — Telephone Encounter (Signed)
Patients daughter called back and said it doesn't need to be done before Anda Kraft gets back so it can wait until then

## 2022-06-15 ENCOUNTER — Other Ambulatory Visit: Payer: Self-pay | Admitting: Primary Care

## 2022-06-15 DIAGNOSIS — F03918 Unspecified dementia, unspecified severity, with other behavioral disturbance: Secondary | ICD-10-CM

## 2022-06-15 DIAGNOSIS — F418 Other specified anxiety disorders: Secondary | ICD-10-CM

## 2022-06-17 NOTE — Telephone Encounter (Signed)
Patient has been scheduled

## 2022-06-28 ENCOUNTER — Ambulatory Visit: Payer: Medicare Other | Admitting: Primary Care

## 2022-06-29 ENCOUNTER — Ambulatory Visit (INDEPENDENT_AMBULATORY_CARE_PROVIDER_SITE_OTHER): Payer: Medicare Other | Admitting: Primary Care

## 2022-06-29 ENCOUNTER — Encounter: Payer: Self-pay | Admitting: Primary Care

## 2022-06-29 VITALS — BP 110/72 | HR 70 | Temp 97.8°F | Ht 67.0 in | Wt 188.0 lb

## 2022-06-29 DIAGNOSIS — F411 Generalized anxiety disorder: Secondary | ICD-10-CM | POA: Diagnosis not present

## 2022-06-29 DIAGNOSIS — F03918 Unspecified dementia, unspecified severity, with other behavioral disturbance: Secondary | ICD-10-CM | POA: Diagnosis not present

## 2022-06-29 DIAGNOSIS — F05 Delirium due to known physiological condition: Secondary | ICD-10-CM | POA: Diagnosis not present

## 2022-06-29 DIAGNOSIS — Z23 Encounter for immunization: Secondary | ICD-10-CM | POA: Diagnosis not present

## 2022-06-29 DIAGNOSIS — R296 Repeated falls: Secondary | ICD-10-CM | POA: Diagnosis not present

## 2022-06-29 DIAGNOSIS — R413 Other amnesia: Secondary | ICD-10-CM | POA: Diagnosis not present

## 2022-06-29 DIAGNOSIS — Z8659 Personal history of other mental and behavioral disorders: Secondary | ICD-10-CM | POA: Diagnosis not present

## 2022-06-29 DIAGNOSIS — R441 Visual hallucinations: Secondary | ICD-10-CM | POA: Diagnosis not present

## 2022-06-29 DIAGNOSIS — F514 Sleep terrors [night terrors]: Secondary | ICD-10-CM | POA: Diagnosis not present

## 2022-06-29 NOTE — Assessment & Plan Note (Signed)
No abrupt changes since last visit.  Recommended that her daughter mention the ongoing sundowning symptoms with her neurologist today. Continue memantine XR 28 mg daily, Aricept 10 mg daily, hydroxyzine 10 mg HS, sertraline 75 mg daily.  Form completed for [facility]. She is benefiting from these services.

## 2022-06-29 NOTE — Patient Instructions (Signed)
Follow up with neurology later today as scheduled.  It was a pleasure to see you today!

## 2022-06-29 NOTE — Progress Notes (Signed)
Subjective:    Patient ID: Caroline French, female    DOB: May 03, 1935, 87 y.o.   MRN: [id number]  HPI  Caroline French is a very pleasant 87 y.o. female with a history of hypertension, CAD, sleep apnea, dementia with behavorial disturbance, overactive bladder, prediabetes who presents today with her daughter for form completion for [employer]. Her daughter is providing information for HPI.  Following with neurology for dementia with behavorial disturbance. Currently managed on donepezil 10 mg daily, memantine XR 28 mg daily, risperidone 0.5 mg HS. She continues to experience anxiety at night, sundowning. Wakes during the night, family is not sleeping well. She has an appointment scheduled with her neurologist for today.  She is also managed on sertraline 75 mg daily and hydroxyzine 10 mg PRN for anxiety. Her daughter has been providing hydroxyzine 10 mg every night. Sometimes her daughter will provide her with melatonin to help with continued sleep.   She has been an active participant in [place] adult daycare since 2019, goes twice weekly for four hours. She is active at her daycare center, plays the piano, interacts with participants, completes arts and crafts, enjoys going.  Review of Systems  Respiratory:  Negative for shortness of breath.   Psychiatric/Behavioral:  Positive for confusion and sleep disturbance. The patient is nervous/anxious.          Past Medical History:  Diagnosis Date   Breast cancer [facility]) 0867'Y   left breast   Dementia with behavioral disturbance ([place])    Diverticulitis    Essential hypertension    Glaucoma    Hallucinations    Overactive bladder     Social History   Socioeconomic History   Marital status: Widowed    Spouse name: Not on file   Number of children: Not on file   Years of education: Not on file   Highest education level: Not on file  Occupational History   Not on file  Tobacco Use   Smoking status: Never    Smokeless tobacco: Never  Vaping Use   Vaping Use: Never used  Substance and Sexual Activity   Alcohol use: Never   Drug use: Never   Sexual activity: Not Currently  Other Topics Concern   Not on file  Social History Narrative   Widow.   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   Moved from [place].   Social Determinants of Health   Financial Resource Strain: Low Risk  (03/30/2022)   Overall Financial Resource Strain (CARDIA)    Difficulty of Paying Living Expenses: Not hard at all  Food Insecurity: No Food Insecurity (03/30/2022)   Hunger Vital Sign    Worried About Running Out of Food in the Last Year: Never true    Ran Out of Food in the Last Year: Never true  Transportation Needs: No Transportation Needs (03/30/2022)   PRAPARE - Hydrologist (Medical): No    Lack of Transportation (Non-Medical): No  Physical Activity: Insufficiently Active (03/30/2022)   Exercise Vital Sign    Days of Exercise per Week: 1 day    Minutes of Exercise per Session: 60 min  Stress: No Stress Concern Present (03/30/2022)   [place]    Feeling of Stress : Not at all  Social Connections: [place] (03/30/2022)   Social Connection and Isolation Panel [NHANES]    Frequency of Communication with Friends and Family: More than three times a week  Frequency of Social Gatherings with Friends and Family: More than three times a week    Attends Religious Services: More than 4 times per year    Active Member of [employer] or Organizations: Yes    Attends Music therapist: More than 4 times per year    Marital Status: Married  Human resources officer Violence: Not At Risk (03/30/2022)   Humiliation, Afraid, Rape, and Kick questionnaire    Fear of Current or Ex-Partner: No    Emotionally Abused: No    Physically Abused: No    Sexually Abused: No    Past Surgical History:  Procedure Laterality Date   ABDOMINAL  HYSTERECTOMY     BREAST BIOPSY     BREAST LUMPECTOMY Left 1990's   CATARACT EXTRACTION, BILATERAL  08/012020    History reviewed. No pertinent family history.  Allergies  Allergen Reactions   Penicillins Rash    Did it involve swelling of the face/tongue/throat, SOB, or low BP? Yes Did it involve sudden or severe rash/hives, skin peeling, or any reaction on the inside of your mouth or nose? Unk Did you need to seek medical attention at a hospital or doctor's office? Unk When did it last happen? "I was young" If all above answers are "NO", may proceed with cephalosporin use.     Current Outpatient Medications on File Prior to Visit  Medication Sig Dispense Refill   Ascorbic Acid (VITAMIN C PO) Take 1 tablet by mouth daily.     aspirin (ASPIRIN LOW DOSE) 81 MG EC tablet TAKE 1 TABLET BY MOUTH ONCE DAILY 90 tablet 3   atorvastatin (LIPITOR) 40 MG tablet TAKE 1 TABLET BY MOUTH DAILY FOR CHOLESTEROL 90 tablet 3   Calcium Citrate-Vitamin D3 315-6.25 MG-MCG TABS Take 1 tablet by mouth daily.     docusate sodium (COLACE) 100 MG capsule Take 100 mg by mouth daily as needed for mild constipation.     donepezil (ARICEPT) 10 MG tablet Take 1 tablet (10 mg total) by mouth at bedtime. For memory. 90 tablet 3   ferrous sulfate 325 (65 FE) MG tablet Take 325 mg by mouth daily.     fluticasone (FLONASE) 50 MCG/ACT nasal spray INSTILL 1 SPRAY IN EACH NOSTRIL TWICE DAILY AS NEEDED FOR ALLERGIES (Patient taking differently: Place 1 spray into both nostrils 2 (two) times daily as needed for allergies.) 48 g 1   hydrOXYzine (ATARAX) 10 MG tablet TAKE 1-2 TABLETS (10-20 MG TOTAL) BY MOUTH DAILY AS NEEDED FOR ANXIETY (FOR TRAVEL AND ANXIETY). 90 tablet 0   latanoprost (XALATAN) 0.005 % ophthalmic solution Place 1 drop into both eyes at bedtime.     lisinopril (ZESTRIL) 5 MG tablet Take 1 tablet (5 mg total) by mouth daily. for blood pressure. 90 tablet 3   loratadine (CLARITIN) 10 MG tablet Take 10 mg by  mouth daily.     memantine (NAMENDA XR) 28 MG CP24 24 hr capsule Take by mouth.     Multiple Vitamin (MULTI-VITAMINS) TABS Take 1 tablet by mouth daily.  11   Omega-3 Fatty Acids (SEA-OMEGA 30) 1200 MG CAPS Take 1 capsule by mouth daily.  11   omeprazole (PRILOSEC) 20 MG capsule Take 1 capsule (20 mg total) by mouth daily. For heartburn 90 capsule 3   polyethylene glycol powder (GLYCOLAX/MIRALAX) 17 GM/SCOOP powder Take 17 g by mouth daily as needed for mild constipation. Pt is  taking at every day     REGULOID 51.7 % POWD Take by mouth.  risperiDONE (RISPERDAL) 0.5 MG tablet Take 1 tablet (0.5 mg total) by mouth at bedtime. 90 tablet 0   sertraline (ZOLOFT) 25 MG tablet Take 1 tablet (25 mg total) by mouth daily. Take with '50mg'$  dose. 90 tablet 3   sertraline (ZOLOFT) 50 MG tablet Take 1 tablet (50 mg total) by mouth daily. For anxiety. Take with 25 mg dose. 90 tablet 3   TOVIAZ 8 MG TB24 tablet Take 1 tablet (8 mg total) by mouth daily. For overactive bladder 90 tablet 3   No current facility-administered medications on file prior to visit.    BP 110/72   Pulse 70   Temp 97.8 F (36.6 C) (Temporal)   Ht '5\' 7"'$  (1.702 m)   Wt 188 lb (85.3 kg)   SpO2 98%   BMI 29.44 kg/m  Objective:   Physical Exam Cardiovascular:     Rate and Rhythm: Normal rate and regular rhythm.  Pulmonary:     Effort: Pulmonary effort is normal.     Breath sounds: Normal breath sounds.  Musculoskeletal:     Cervical back: Neck supple.  Skin:    General: Skin is warm and dry.           Assessment & Plan:   Problem List Items Addressed This Visit       Nervous and Auditory   Dementia with behavioral disturbance ([place])    No abrupt changes since last visit.  Recommended that her daughter mention the ongoing sundowning symptoms with her neurologist today. Continue memantine XR 28 mg daily, Aricept 10 mg daily, hydroxyzine 10 mg HS, sertraline 75 mg daily.  Form completed for [employer]. She is benefiting from these services.           Pleas Koch, NP

## 2022-07-23 ENCOUNTER — Other Ambulatory Visit: Payer: Self-pay | Admitting: Primary Care

## 2022-07-23 DIAGNOSIS — F418 Other specified anxiety disorders: Secondary | ICD-10-CM

## 2022-07-23 DIAGNOSIS — F03918 Unspecified dementia, unspecified severity, with other behavioral disturbance: Secondary | ICD-10-CM

## 2022-07-26 ENCOUNTER — Encounter: Payer: Self-pay | Admitting: Family Medicine

## 2022-07-26 ENCOUNTER — Telehealth (INDEPENDENT_AMBULATORY_CARE_PROVIDER_SITE_OTHER): Payer: Medicare Other | Admitting: Family Medicine

## 2022-07-26 VITALS — BP 112/60 | HR 80 | Temp 99.3°F | Ht 67.0 in

## 2022-07-26 DIAGNOSIS — J208 Acute bronchitis due to other specified organisms: Secondary | ICD-10-CM

## 2022-07-26 MED ORDER — BENZONATATE 200 MG PO CAPS: 200.0000 mg | ORAL_CAPSULE | Freq: Three times a day (TID) | ORAL | 1 refills | Status: DC | PRN

## 2022-07-26 MED ORDER — DOXYCYCLINE HYCLATE 100 MG PO TABS: 100.0000 mg | ORAL_TABLET | Freq: Two times a day (BID) | ORAL | 0 refills | Status: DC

## 2022-07-26 NOTE — Progress Notes (Signed)
Codie Krogh T. Alejandrina Raimer, MD, [place] [facility] [place], [address]  Phone: [phone]  FAX: [phone]  Naasia Checketts - 87 y.o. female  MRN [id number]  Date of Birth: 02/03/1935  Date: 07/26/2022  PCP: Pleas Koch, NP  Referral: Pleas Koch, NP  Chief Complaint  Patient presents with   Cough    Dry-No Covid Test done but daughter was tested [facility] 10/[facility] and was negative.     Fever   Fatigue        Virtual Visit via Video Note:  I connected with  Madylyn Crowell on 07/26/2022  2:20 PM EDT by a video enabled telemedicine application and verified that I am speaking with the correct person using two identifiers.   Location patient: home computer, tablet, or smartphone Location provider: work or home office Consent: Verbal consent directly obtained from Selinda Vandermeer. Persons participating in the virtual visit: patient, provider  I discussed the limitations of evaluation and management by telemedicine and the availability of in person appointments. The patient expressed understanding and agreed to proceed.  Chief Complaint  Patient presents with   Cough    Dry-No Covid Test done but daughter was tested [facility] and was negative.     Fever   Fatigue         History of Present Illness:  Almost entirely with the patient's daughter, her caregiver, about the patient's overall state right now.  She relates that this morning she was feeling quite ill and she had to get 2 other adults to help get her out of bed.  She is sitting and watching TV and is a lot more comfortable right now and seems to be breathing comfortably.  Tmax was 101 this morning, and she has decreased down into the 99 range right now.  Her daughter was sick last week and expose the patient, and daughter was reportedly COVID-negative.  They do not have a test at home, and they have not checked her for COVID-19.  Last  evening - felt really weak.  Not able to get up from her chair.  Goes up and down a lot up and down the stairs usually.  She is requiring much greater assistance right now.  This morning, took 3 people to get her out of bed.  Has a walker to help her get up.  101 temperature this morning.  Gave her Tylenol first thing this morning, and it was 100.    Review of Systems as above: See pertinent positives and pertinent negatives per HPI No acute distress verbally   Observations/Objective/Exam:  An attempt was made to discern vital signs over the phone and per patient if applicable and possible.   General:    Alert, Oriented, appears well and in no acute distress  Pulmonary:     On inspection no signs of respiratory distress.  Psych / Neurological:     Pleasant and cooperative.  Assessment and Plan:    ICD-10-CM   1. Acute bronchitis due to other specified organisms  J20.8      This is difficult to fully assess on a video visit while talking with the patient's caregiver rather than the patient.  I requested that they do a COVID-19 test, and when the results come back if they are positive, please let me know and we will initiate antivirals.  For now, given age, risk, fever, I am going placed  the patient on doxycycline and give her some Tessalon.  I counseled close caution and she would really need to be seen in the office face-to-face if she is not improving in a couple of days.  I discussed the assessment and treatment plan with the patient. The patient was provided an opportunity to ask questions and all were answered. The patient agreed with the plan and demonstrated an understanding of the instructions.   The patient was advised to call back or seek an in-person evaluation if the symptoms worsen or if the condition fails to improve as anticipated.  Follow-up: prn unless noted otherwise below No follow-ups on file.  Meds ordered this encounter  Medications   doxycycline  (VIBRA-TABS) 100 MG tablet    Sig: Take 1 tablet (100 mg total) by mouth 2 (two) times daily.    Dispense:  20 tablet    Refill:  0   benzonatate (TESSALON) 200 MG capsule    Sig: Take 1 capsule (200 mg total) by mouth 3 (three) times daily as needed for cough.    Dispense:  40 capsule    Refill:  1   No orders of the defined types were placed in this encounter.   Signed,  Maud Deed. Tiaria Biby, MD

## 2022-07-27 ENCOUNTER — Telehealth: Payer: Self-pay | Admitting: Primary Care

## 2022-07-27 MED ORDER — MOLNUPIRAVIR EUA 200MG CAPSULE: 4.0000 | ORAL_CAPSULE | Freq: Two times a day (BID) | ORAL | 0 refills | Status: AC

## 2022-07-27 NOTE — Addendum Note (Signed)
Addended by: Owens Loffler on: 07/27/2022 02:25 PM   Modules accepted: Orders

## 2022-07-27 NOTE — Telephone Encounter (Signed)
Can you call them  I sent her in some Molnupiravir for Covid-19.  Stop doxycycline, no need to take this, since she has Covid.

## 2022-07-27 NOTE — Telephone Encounter (Addendum)
[name] (daughter) notified as instructed by telephone.  [name] asked if Ms. Hancock can still take the [employer]  Advised it is okay that she continues the Tessalon Per[place] to help with cough.   Medication list updated.

## 2022-07-27 NOTE — Telephone Encounter (Signed)
Patient had a mychart video visit with [facility] on yesterday 11/16. He instructed her to take a covid test,where she tested positive today. He told her to call in so that and antiviral can be sent in for her.

## 2022-08-12 DIAGNOSIS — R296 Repeated falls: Secondary | ICD-10-CM | POA: Diagnosis not present

## 2022-08-12 DIAGNOSIS — F05 Delirium due to known physiological condition: Secondary | ICD-10-CM | POA: Diagnosis not present

## 2022-08-12 DIAGNOSIS — F03A0 Unspecified dementia, mild, without behavioral disturbance, psychotic disturbance, mood disturbance, and anxiety: Secondary | ICD-10-CM | POA: Diagnosis not present

## 2022-08-12 DIAGNOSIS — F411 Generalized anxiety disorder: Secondary | ICD-10-CM | POA: Diagnosis not present

## 2022-08-12 DIAGNOSIS — F514 Sleep terrors [night terrors]: Secondary | ICD-10-CM | POA: Diagnosis not present

## 2022-08-12 DIAGNOSIS — R413 Other amnesia: Secondary | ICD-10-CM | POA: Diagnosis not present

## 2022-08-12 DIAGNOSIS — E119 Type 2 diabetes mellitus without complications: Secondary | ICD-10-CM | POA: Diagnosis not present

## 2022-08-12 DIAGNOSIS — Z8659 Personal history of other mental and behavioral disorders: Secondary | ICD-10-CM | POA: Diagnosis not present

## 2022-08-12 DIAGNOSIS — R441 Visual hallucinations: Secondary | ICD-10-CM | POA: Diagnosis not present

## 2022-08-26 ENCOUNTER — Other Ambulatory Visit: Payer: Self-pay | Admitting: Primary Care

## 2022-08-26 DIAGNOSIS — F418 Other specified anxiety disorders: Secondary | ICD-10-CM

## 2022-08-26 DIAGNOSIS — F03918 Unspecified dementia, unspecified severity, with other behavioral disturbance: Secondary | ICD-10-CM

## 2022-09-07 ENCOUNTER — Emergency Department (HOSPITAL_COMMUNITY)
Admission: EM | Admit: 2022-09-07 | Discharge: 2022-09-07 | Disposition: A | Discharge status: Home / Self Care | Payer: Medicare Other | Attending: Emergency Medicine | Admitting: Emergency Medicine

## 2022-09-07 ENCOUNTER — Telehealth: Payer: Self-pay | Admitting: Primary Care

## 2022-09-07 ENCOUNTER — Encounter (HOSPITAL_COMMUNITY): Payer: Self-pay | Admitting: Emergency Medicine

## 2022-09-07 ENCOUNTER — Other Ambulatory Visit: Payer: Self-pay

## 2022-09-07 ENCOUNTER — Emergency Department (HOSPITAL_COMMUNITY): Payer: Medicare Other

## 2022-09-07 DIAGNOSIS — R4182 Altered mental status, unspecified: Secondary | ICD-10-CM | POA: Diagnosis not present

## 2022-09-07 DIAGNOSIS — G4733 Obstructive sleep apnea (adult) (pediatric): Secondary | ICD-10-CM | POA: Diagnosis not present

## 2022-09-07 DIAGNOSIS — I959 Hypotension, unspecified: Secondary | ICD-10-CM | POA: Diagnosis not present

## 2022-09-07 DIAGNOSIS — I10 Essential (primary) hypertension: Secondary | ICD-10-CM | POA: Diagnosis not present

## 2022-09-07 DIAGNOSIS — Z853 Personal history of malignant neoplasm of breast: Secondary | ICD-10-CM | POA: Diagnosis not present

## 2022-09-07 DIAGNOSIS — R531 Weakness: Secondary | ICD-10-CM | POA: Diagnosis not present

## 2022-09-07 DIAGNOSIS — Z79899 Other long term (current) drug therapy: Secondary | ICD-10-CM | POA: Diagnosis not present

## 2022-09-07 DIAGNOSIS — F0390 Unspecified dementia without behavioral disturbance: Secondary | ICD-10-CM | POA: Diagnosis not present

## 2022-09-07 DIAGNOSIS — Z743 Need for continuous supervision: Secondary | ICD-10-CM | POA: Diagnosis not present

## 2022-09-07 LAB — CBC WITH DIFFERENTIAL/PLATELET
Abs Immature Granulocytes: 0.01 10*3/uL (ref 0.00–0.07)
Basophils Absolute: 0 10*3/uL (ref 0.0–0.1)
Basophils Relative: 1 %
Eosinophils Absolute: 0.1 10*3/uL (ref 0.0–0.5)
Eosinophils Relative: 2 %
HCT: 39.5 % (ref 36.0–46.0)
Hemoglobin: 12.7 g/dL (ref 12.0–15.0)
Immature Granulocytes: 0 %
Lymphocytes Relative: 19 %
Lymphs Abs: 1 10*3/uL (ref 0.7–4.0)
MCH: 30.9 pg (ref 26.0–34.0)
MCHC: 32.2 g/dL (ref 30.0–36.0)
MCV: 96.1 fL (ref 80.0–100.0)
Monocytes Absolute: 0.5 10*3/uL (ref 0.1–1.0)
Monocytes Relative: 9 %
Neutro Abs: 3.5 10*3/uL (ref 1.7–7.7)
Neutrophils Relative %: 69 %
Platelets: 177 10*3/uL (ref 150–400)
RBC: 4.11 MIL/uL (ref 3.87–5.11)
RDW: 14.6 % (ref 11.5–15.5)
WBC: 5 10*3/uL (ref 4.0–10.5)
nRBC: 0 % (ref 0.0–0.2)

## 2022-09-07 LAB — URINALYSIS, ROUTINE W REFLEX MICROSCOPIC
Bacteria, UA: NONE SEEN
Bilirubin Urine: NEGATIVE
Glucose, UA: NEGATIVE mg/dL
Hgb urine dipstick: NEGATIVE
Ketones, ur: NEGATIVE mg/dL
Nitrite: NEGATIVE
Protein, ur: 30 mg/dL — AB
Specific Gravity, Urine: 1.026 (ref 1.005–1.030)
pH: 5 (ref 5.0–8.0)

## 2022-09-07 LAB — COMPREHENSIVE METABOLIC PANEL
ALT: 27 U/L (ref 0–44)
AST: 33 U/L (ref 15–41)
Albumin: 3.6 g/dL (ref 3.5–5.0)
Alkaline Phosphatase: 69 U/L (ref 38–126)
Anion gap: 8 (ref 5–15)
BUN: 14 mg/dL (ref 8–23)
CO2: 24 mmol/L (ref 22–32)
Calcium: 9.3 mg/dL (ref 8.9–10.3)
Chloride: 108 mmol/L (ref 98–111)
Creatinine, Ser: 0.98 mg/dL (ref 0.44–1.00)
GFR, Estimated: 57 mL/min — ABNORMAL LOW (ref >60)
Glucose, Bld: 140 mg/dL — ABNORMAL HIGH (ref 70–99)
Potassium: 3.6 mmol/L (ref 3.5–5.1)
Sodium: 140 mmol/L (ref 135–145)
Total Bilirubin: 0.7 mg/dL (ref 0.3–1.2)
Total Protein: 7.3 g/dL (ref 6.5–8.1)

## 2022-09-07 LAB — TROPONIN I (HIGH SENSITIVITY)
Troponin I (High Sensitivity): 5 ng/L (ref <18)
Troponin I (High Sensitivity): 10 ng/L (ref <18)

## 2022-09-07 NOTE — Telephone Encounter (Signed)
Noted, will await notes. ° °

## 2022-09-07 NOTE — Telephone Encounter (Signed)
Patient daughter called and stated patient fell and legs are wobbly. Patient was sent to access nurse.

## 2022-09-07 NOTE — Telephone Encounter (Signed)
Per chart review tab pt is presently at [facility] ED.sending note to Gentry Fitz NP and [employer].

## 2022-09-07 NOTE — Telephone Encounter (Signed)
[facility] - Client TELEPHONE ADVICE RECORD Access[facility]urse Patient Name: Caroline French Gender: Female DOB: 03/07/1935 Age: 87 Y 20 D Return Phone Number: [phone] (Primary) Address[place]  [address] Client [facility] [facility] - Client Client Site [place] - Day Provider Alma Friendly - NP Contact Type Call Who Is Calling Patient / Member / Family / Caregiver Call Type Triage / Clinical Caller Name [name] Relationship To Patient Daughter Return Phone Number [phone] (Primary) Chief Complaint Walking difficulty Reason for Call Symptomatic / Request for [place] states that he is transferring a patient's daughter to be triaged. The patient fell due to wobbly legs. She is calling in because she is okay after the fall but still experiencing wobbly legs. Translation No Nurse Assessment Nurse: Ysidro Evert, RN, Levada Dy Date/Time Eilene Ghazi Time): 09/07/2022 1:50:52 PM Confirm and document reason for call. If symptomatic, describe symptoms. ---Caller states her patient fell about 20 minutes ago while she was walking and she got weak and she slid down onto the floor. Her legs are wobbly and she is too weak too stand but she did not have any injury when she slid down to the floor Does the patient have any new or worsening symptoms? ---Yes Will a triage be completed? ---Yes Related visit to physician within the last 2 weeks? ---No Does the PT have any chronic conditions? (i.e. diabetes, asthma, this includes High risk factors for pregnancy, etc.) ---Yes List chronic conditions. ---pre-diabetes, dementia, hypertension, high cholesterol. incontinence Is this a behavioral health or substance abuse call? ---No Guidelines Guideline Title Affirmed Question Affirmed Notes Nurse Date/Time (Eastern Time) Weakness (Generalized) and Fatigue [1]  SEVERE weakness (i.e., unable to walk or barely able to walk, requires support) Ysidro Evert, RN, Levada Dy 09/07/2022 1:56:32 PM PLEASE NOTE: All timestamps contained within this report are represented as [place] Standard Time. CONFIDENTIALTY NOTICE: This fax transmission is intended only for the addressee. It contains information that is legally privileged, confidential or otherwise protected from use or disclosure. If you are not the intended recipient, you are strictly prohibited from reviewing, disclosing, copying using or disseminating any of this information or taking any action in reliance on or regarding this information. If you have received this fax in error, please notify us immediately by telephone so that we can arrange for its return to [place]. Phone: [phone], Toll-Free: [phone], Fax: [phone] Page: 2 of 2 Call Id: [id number] Guidelines Guideline Title Affirmed Question Affirmed Notes Nurse Date/Time Eilene Ghazi Time) AND [2] new-onset or worsening Disp. Time Eilene Ghazi Time) Disposition Final User 09/07/2022 2:00:04 PM Call EMS 911 Now Yes Ysidro Evert, RN, Levada Dy 09/07/2022 2:06:19 PM 911 Outcome Documentation Ysidro Evert, RN, Levada Dy Reason: Caller states she will call the ambulance. Called back in 5 minutes and she states the ambulance is on the way Final Disposition 09/07/2022 2:00:04 PM Call EMS 911 Now Yes Ysidro Evert, RN, [place] Shutter Disagree/Comply Comply Caller Understands Yes PreDisposition Did not know what to do Care Advice Given Per Guideline CALL EMS 911 NOW: * Immediate medical attention is needed. You need to hang up and call 911 (or an ambulance). * Triager Discretion: I'll call you back in a few minutes to be sure you were able to reach them. CARE ADVICE given per Weakness and Fatigue (Adult) guideline

## 2022-09-07 NOTE — Progress Notes (Signed)
Transition of Care [facility]) - Emergency Department Mini Assessment   Patient Details  Name: Caroline French MRN: [id number] Date of Birth: 1935/07/21  [facility] CM/SW Contact:    Rodney Booze, LCSW Phone Number: 09/07/2022, 9:08 PM   Clinical Narrative:  CSW spoke to the daughter in the patients room, The daughter does not want SNF. The daughter requested home health PT, RN, SW. The daughter reported having aids in the house however needed to have extra [place]. The daughter also requested a hospital bed, CSW was not sure to advised her to contact a DME store. CSW made sure to answer all the daughters question. CSW was able to set up Home health with [facility], The home heath agency will start tomorrow. TOC is signing off as there are no additional TOC needs at this time.   ED Mini Assessment: What brought you to the Emergency Department? : (P) Patient was having weakness  Barriers to Discharge: (P) No Barriers Identified  Barrier interventions: (P) Patient is not able to move out of bed at theis current time. The patient will get Home PT.  Means of departure: (P) Ambulance  Interventions which prevented an admission or readmission: (P) [place] or Services    Patient Contact and Communications   Key Contact 2: (P) [name] [phone] Spoke with: (P) Daughter Tessie Eke Date: (P) 09/07/22,   Contact time: (P) 2108 Contact Phone Number: (P) [name] [phone]    Patient states their goals for this hospitalization and ongoing recovery are:: (P) Daughter would like the mom to get her strength back.      Admission diagnosis:  leg weakness both legs Patient Active Problem List   Diagnosis Date Noted   Cerumen impaction 04/23/2022   Sepsis ([place]) 12/23/2021   Dementia ([place]) 12/23/2021   AKI (acute kidney injury) ([place]) 12/23/2021   CAP (community acquired pneumonia) 12/22/2021   Urgency of urination 09/23/2020   Gastroesophageal reflux disease  01/29/2020   Altered mental status 09/11/2019   Sleep apnea 10/19/2018   Prediabetes 07/21/2018   Hyperlipidemia 01/13/2018   Dementia with behavioral disturbance ([place]) 01/13/2018   Overactive bladder 01/13/2018   Glaucoma 01/13/2018   Essential hypertension 01/13/2018   PCP:  Pleas Koch, NP [id number]: [phone]: [phone]

## 2022-09-07 NOTE — ED Provider Notes (Signed)
[facility] DEPT Provider Note   CSN: [id number] Arrival date & time: 09/07/22  1502     History  Chief Complaint  Patient presents with   Extremity Weakness    Caroline French is a 87 y.o. female with dementia, HLD, overactive bladder, HTN, GERD, OSA, prediabetes presents with extremity weakness.   History provided by daughter. Patient over the last couple of days to weeks has had worsening generalized weakness and bilateral lower extremity weakness, unsteadiness on her feet and difficulty climbing stairs in her home. Did have one fall today while attempting to walk with home aide and walker, she fell backwards but was caught by the aide, fell onto her bottom with minimal force, no head trauma or LOC.  Daughter has not noted any fevers/chills. Patient has not complained of any SOB, CP, abd pain, N/V/D/C, or urinary symptoms. Daughter confirms no focal weakness or asymmetric weakness, just weakness all over.    Extremity Weakness       Home Medications Prior to Admission medications   Medication Sig Start Date End Date Taking? Authorizing Provider  Ascorbic Acid (VITAMIN C PO) Take 1 tablet by mouth daily.    [provider]  aspirin (ASPIRIN LOW DOSE) 81 MG EC tablet TAKE 1 TABLET BY MOUTH ONCE DAILY 01/22/22   Pleas Koch, NP  atorvastatin (LIPITOR) 40 MG tablet TAKE 1 TABLET BY MOUTH DAILY FOR CHOLESTEROL 02/25/22   Pleas Koch, NP  benzonatate (TESSALON) 200 MG capsule Take 1 capsule (200 mg total) by mouth 3 (three) times daily as needed for cough. 07/26/22   Copland, Frederico Hamman, MD  Calcium Citrate-Vitamin D3 315-6.25 MG-MCG TABS Take 1 tablet by mouth daily. 01/08/22   [provider]  docusate sodium (COLACE) 100 MG capsule Take 100 mg by mouth daily as needed for mild constipation.    [provider]  donepezil (ARICEPT) 10 MG tablet Take 1 tablet (10 mg total) by mouth at bedtime. For memory. 01/30/19   Pleas Koch, NP  ferrous sulfate 325 (65 FE) MG tablet Take 325 mg by mouth daily.    [provider]  fluticasone (FLONASE) 50 MCG/ACT nasal spray INSTILL 1 SPRAY IN EACH NOSTRIL TWICE DAILY AS NEEDED FOR ALLERGIES Patient taking differently: Place 1 spray into both nostrils 2 (two) times daily as needed for allergies. 04/30/21   Pleas Koch, NP  hydrOXYzine (ATARAX) 10 MG tablet TAKE 1 TO 2 TABLETS BY MOUTH DAILY AS NEEDED FOR ANXIETY AND TRAVEL 08/27/22   Pleas Koch, NP  latanoprost (XALATAN) 0.005 % ophthalmic solution Place 1 drop into both eyes at bedtime. 04/20/17   [provider]  lisinopril (ZESTRIL) 5 MG tablet Take 1 tablet (5 mg total) by mouth daily. for blood pressure. 02/25/22   Pleas Koch, NP  loratadine (CLARITIN) 10 MG tablet Take 10 mg by mouth daily.    [provider]  memantine (NAMENDA XR) 28 MG CP24 24 hr capsule Take by mouth. 03/05/22   [provider]  Multiple Vitamin (MULTI-VITAMINS) TABS Take 1 tablet by mouth daily. 01/11/18   [provider]  Omega-3 Fatty Acids (SEA-OMEGA 30) 1200 MG CAPS Take 1 capsule by mouth daily. 01/11/18   [provider]  omeprazole (PRILOSEC) 20 MG capsule Take 1 capsule (20 mg total) by mouth daily. For heartburn 02/25/22   Pleas Koch, NP  polyethylene glycol powder (GLYCOLAX/MIRALAX) 17 GM/SCOOP powder Take 17 g by mouth daily as needed for  mild constipation. Pt is  taking at every day    [provider]  risperiDONE (RISPERDAL) 1 MG tablet Take 2 mg by mouth at bedtime. 07/12/22   [provider]  sertraline (ZOLOFT) 25 MG tablet Take 1 tablet (25 mg total) by mouth daily. Take with '50mg'$  dose. 02/25/22   Pleas Koch, NP  sertraline (ZOLOFT) 50 MG tablet Take 1 tablet (50 mg total) by mouth daily. For anxiety. Take with 25 mg dose. 02/25/22   Pleas Koch, NP  TOVIAZ 8 MG TB24 tablet Take 1 tablet (8 mg total) by mouth daily. For  overactive bladder 02/25/22   Pleas Koch, NP      Allergies    Penicillins    Review of Systems   Review of Systems  Musculoskeletal:  Positive for extremity weakness.   Review of systems obtained from daugher at bed isNegative for f/c.  A 10 point review of systems was performed and is negative unless otherwise reported in HPI.  Physical Exam Updated Vital Signs BP (!) 159/77   Pulse 73   Temp 97.8 F (36.6 C) (Oral)   Resp 19   Ht '5\' 7"'$  (1.702 m)   Wt 85 kg   SpO2 95%   BMI 29.35 kg/m  Physical Exam General: Normal appearing elderly female, lying in bed.  HEENT: PERRLA, EOMI, no facial droop, tongue protrudes midline, Sclera anicteric, MMM, trachea midline. Large left frontal scalp hematoma without any other signs of trauma.  Cardiology: RRR, no murmurs/rubs/gallops. BL radial and DP pulses equal bilaterally.  Resp: Normal respiratory rate and effort. CTAB, no wheezes, rhonchi, crackles.  Abd: Soft, non-tender, non-distended. No rebound tenderness or guarding.  GU: Deferred. MSK: No peripheral edema or signs of trauma. Extremities without deformity or TTP. No cyanosis or clubbing. Skin: warm, dry. No rashes or lesions. Back: No CVA tenderness Neuro: A&Ox1 (self) which daughter states is baseline, CNs II-XII grossly intact. MAEs. Sensation grossly intact.  Psych: Normal mood and affect.   ED Results / Procedures / Treatments   Labs (all labs ordered are listed, but only abnormal results are displayed) Labs Reviewed  COMPREHENSIVE METABOLIC PANEL - Abnormal; Notable for the following components:      Result Value   Glucose, Bld 140 (*)    GFR, Estimated 57 (*)    All other components within normal limits  URINALYSIS, ROUTINE W REFLEX MICROSCOPIC - Abnormal; Notable for the following components:   Protein, ur 30 (*)    Leukocytes,Ua TRACE (*)    All other components within normal limits  CBC WITH DIFFERENTIAL/PLATELET  TROPONIN I (HIGH SENSITIVITY)  TROPONIN  I (HIGH SENSITIVITY)   Radiology CT Head Wo Contrast  Result Date: 09/07/2022 CLINICAL DATA:  weakness of BLE EXAM: CT HEAD WITHOUT CONTRAST TECHNIQUE: Contiguous axial images were obtained from the base of the skull through the vertex without intravenous contrast. RADIATION DOSE REDUCTION: This exam was performed according to the departmental dose-optimization program which includes automated exposure control, adjustment of the mA and/or kV according to patient size and/or use of iterative reconstruction technique. COMPARISON:  CT head 12/23/2021 FINDINGS: Brain: Volume-averaging artifact along the bilateral frontal lobes (2:9). No evidence of large-territorial acute infarction. No parenchymal hemorrhage. No mass lesion. No extra-axial collection. No mass effect or midline shift. No hydrocephalus. Basilar cisterns are patent. Vascular: No hyperdense vessel. Skull: No acute fracture or focal lesion. Sinuses/Orbits: Slightly increased partial opacification of the right mastoid air cells. Debris within the bilateral external auditory  canals. No definite free fluid within the middle auditory canals. Paranasal sinuses and left mastoid air cells are clear. The orbits are unremarkable. Other: Left frontal scalp 8 mm hematoma. IMPRESSION: 1. No acute intracranial abnormality. 2. Left frontal scalp 8 mm hematoma. Electronically Signed   By: Iven Finn M.D.   On: 09/07/2022 19:06   DG Chest 2 View  Result Date: 09/07/2022 CLINICAL DATA:  weakness EXAM: CHEST - 2 VIEW COMPARISON:  Chest x-ray 01/12/2022 FINDINGS: The heart and mediastinal contours are unchanged. Aortic calcification. No focal consolidation. No pulmonary edema. No pleural effusion. No pneumothorax. No acute osseous abnormality. Similar-appearing stature rated kyphotic curvature with multiple level vertebral body height loss. Degenerative changes of the right shoulder. Vascular clips overlies left lower chest. IMPRESSION: 1. No active  cardiopulmonary disease. 2.  Aortic Atherosclerosis (ICD10-I70.0). Electronically Signed   By: Iven Finn M.D.   On: 09/07/2022 19:02    Procedures Procedures    Medications Ordered in ED Medications - No data to display  ED Course/ Medical Decision Making/ A&P                          Medical Decision Making Amount and/or Complexity of Data Reviewed Labs:  Decision-making details documented in ED Course. Radiology:  Decision-making details documented in ED Course.    This patient presents to the ED for concern of generalized weakness, this involves an extensive number of treatment options, and is a complaint that carries with it a high risk of complications and morbidity.  I considered the following differential and admission for this acute, potentially life threatening condition. Patient is HDS and overall non-toxic appearing at this time, will evaluate with labs.  MDM:    DDX for generalized weakness includes but is not limited to:  Infectious processes, severe metabolic derangements or electrolyte abnormalities, ischemia/ACS, anemia, and intracranial/central processes but think these are unlikely given the history and physical exam. Non-focal neuro exam. Daughter states scalp hematoma is chronic and patient did not have any head trauma today, however patient has reportedly fallen multiple times and cannot provide her own history so will obtain CTH to r/o ICH.   Discussed extensively with daughter at bedside about [facility] consult and possible routes of action. Daugther states that they are able to care for her mother at home and do not want her to board in the ED while awaiting placement. I consulted TOC for arranging additional home health and potentially placing patient.    Clinical Course as of 09/15/22 1428  Tue Sep 07, 2022  2040 CBC, CMP, trop reassuring. Pending urine. [HN]  2114 TOC consulted and arranged for additional home health/PT/OT. Will also put in order for  hospital bed.  [HN]  2228 Urinalysis, Routine w reflex microscopic Urine, In & Out Cath(!) No UTI [HN]  2228 CT Head Wo Contrast 1. No acute intracranial abnormality. 2. Left frontal scalp 8 mm hematoma.  Scalp hematoma is chronic per daughter for years. [HN]    Clinical Course User Index [HN] Audley Hose, MD     Labs: I Ordered, and personally interpreted labs.  The pertinent results include those listed above.  Imaging Studies ordered: I ordered imaging studies including CTH NAICP. I independently visualized and interpreted imaging. I agree with the radiologist interpretation  Additional history obtained from daughter at bedside and chart review.  Cardiac Monitoring: The patient was maintained on a cardiac monitor.  I personally viewed and interpreted the cardiac  monitored which showed an underlying rhythm of: NSR  Reevaluation: After the interventions noted above, I reevaluated the patient and found that they have :stayed the same  Social Determinants of Health: Patient lives at home with home health and family to help  Disposition:  Patient is stable for discharge at this time. No life threatening etiology of her generalized weakness present. Patient can f/u as o/p with PCP within 1-2 weeks. DC with additional home health/PT/OT and order for hospital bed as requested by daughter. D/w daughter discharge instructions and return precautions, she reports understanding.   Co morbidities that complicate the patient evaluation  Past Medical History:  Diagnosis Date   Breast cancer ([place]) 7225'J   left breast   Dementia with behavioral disturbance ([place])    Diverticulitis    Essential hypertension    Glaucoma    Hallucinations    Overactive bladder      Medicines No orders of the defined types were placed in this encounter.   I have reviewed the patients home medicines and have made adjustments as needed  Problem List / ED Course: Problem List Items Addressed This  Visit   None Visit Diagnoses     Weakness    -  Primary         This note was created using dictation software, which may contain spelling or grammatical errors.    Audley Hose, MD 09/15/22 [phone]

## 2022-09-07 NOTE — ED Notes (Signed)
Called pt's legal guardian to provide care report, she had no further questions or concerns for me. Family will be at home to await pt's arrival. [facility] called for transportation home.

## 2022-09-07 NOTE — ED Provider Triage Note (Signed)
Emergency Medicine Provider Triage Evaluation Note  Caroline French , a 87 y.o. female  was evaluated in triage.  Pt complains of bilateral lower extremity weakness and increased falls for the last few weeks. Daughter at bedside provides entire hx---hx of dementia. Daughter notes pt's legs have been giving out and increased weakness. Likely will need SNF per daughter. .  Review of Systems  Positive: weakness Negative: SOB  Physical Exam  BP (!) 147/70   Pulse 78   Temp 98.5 F (36.9 C) (Oral)   Resp 16   Ht '5\' 7"'$  (1.702 m)   Wt 85 kg   SpO2 95%   BMI 29.35 kg/m  Gen:   Awake, no distress   Resp:  Normal effort  MSK:   3/5 weakness of BLE  Medical Decision Making  Medically screening exam initiated at 6:25 PM.  Appropriate orders placed.  Caroline French was informed that the remainder of the evaluation will be completed by another provider, this initial triage assessment does not replace that evaluation, and the importance of remaining in the ED until their evaluation is complete.     Caroline French, [place] 09/07/22 1826

## 2022-09-07 NOTE — ED Notes (Signed)
Pt expressed she really does not like I&&O cath due to hx of not good experience, will try purewick and informed pt if she does not have output with purewick, I&O will be the best option.

## 2022-09-07 NOTE — ED Triage Notes (Signed)
Pt bib ems from home c/o bilateral leg weakness x 1 month. Pt "legs went out" while walking with caregiver pta. Pt was helped to the ground. No other complaints.

## 2022-09-07 NOTE — Discharge Instructions (Addendum)
Home health was set up for this patient with Malachy Mood from [place].  Phone number is [phone]. Agency will contact the daughter in the AM. CSW also spoke to [place] about ordering a bed for the patient. The provider put in orders for [facility] PT,RN, SW, and bed.  [place] LCSW-A   Thank you for coming to [facility] Emergency Department. You were seen for weakness. We did an exam, labs, and imaging, and these showed no acute findings. We discussed with social work who will get home health and PT/OT to your house tomorrow. We also have placed an order for a hospital bed.   Please follow up with your primary care provider within 1 week.   Do not hesitate to return to the ED or call 911 if you experience: -Worsening symptoms -Falls, head trauma -Chest pain, shortness of breath -Asymmetric numbness/tingling, weakness, facial droop, trouble speaking or swallowing -Lightheadedness, passing out -Fevers/chills -Anything else that concerns you

## 2022-09-08 ENCOUNTER — Telehealth: Payer: Self-pay | Admitting: Primary Care

## 2022-09-08 NOTE — Telephone Encounter (Signed)
Spoke with Malachy Mood from [facility], gave approval of verbal orders.

## 2022-09-08 NOTE — Telephone Encounter (Signed)
Approved.  

## 2022-09-08 NOTE — Telephone Encounter (Signed)
Home Health verbal orders Caller Name:Cheryl  Agency Name:[facility] number: [phone]  Requesting OT/PT/Skilled nursing/Social Work/Speech:  Reason:skilled nursing, OT , PT  Frequency:  Please forward to [facility] pool or providers CMA

## 2022-09-09 DIAGNOSIS — K5792 Diverticulitis of intestine, part unspecified, without perforation or abscess without bleeding: Secondary | ICD-10-CM | POA: Diagnosis not present

## 2022-09-09 DIAGNOSIS — G4730 Sleep apnea, unspecified: Secondary | ICD-10-CM | POA: Diagnosis not present

## 2022-09-09 DIAGNOSIS — F0394 Unspecified dementia, unspecified severity, with anxiety: Secondary | ICD-10-CM | POA: Diagnosis not present

## 2022-09-09 DIAGNOSIS — J208 Acute bronchitis due to other specified organisms: Secondary | ICD-10-CM | POA: Diagnosis not present

## 2022-09-09 DIAGNOSIS — R531 Weakness: Secondary | ICD-10-CM | POA: Diagnosis not present

## 2022-09-09 DIAGNOSIS — Z8616 Personal history of COVID-19: Secondary | ICD-10-CM | POA: Diagnosis not present

## 2022-09-09 DIAGNOSIS — I2510 Atherosclerotic heart disease of native coronary artery without angina pectoris: Secondary | ICD-10-CM | POA: Diagnosis not present

## 2022-09-09 DIAGNOSIS — R296 Repeated falls: Secondary | ICD-10-CM | POA: Diagnosis not present

## 2022-09-09 DIAGNOSIS — H409 Unspecified glaucoma: Secondary | ICD-10-CM | POA: Diagnosis not present

## 2022-09-09 DIAGNOSIS — I10 Essential (primary) hypertension: Secondary | ICD-10-CM | POA: Diagnosis not present

## 2022-09-09 DIAGNOSIS — R7303 Prediabetes: Secondary | ICD-10-CM | POA: Diagnosis not present

## 2022-09-09 DIAGNOSIS — N3281 Overactive bladder: Secondary | ICD-10-CM | POA: Diagnosis not present

## 2022-09-09 DIAGNOSIS — E7800 Pure hypercholesterolemia, unspecified: Secondary | ICD-10-CM | POA: Diagnosis not present

## 2022-09-09 DIAGNOSIS — Z853 Personal history of malignant neoplasm of breast: Secondary | ICD-10-CM | POA: Diagnosis not present

## 2022-09-09 DIAGNOSIS — F03918 Unspecified dementia, unspecified severity, with other behavioral disturbance: Secondary | ICD-10-CM | POA: Diagnosis not present

## 2022-09-10 ENCOUNTER — Telehealth: Payer: Self-pay

## 2022-09-10 NOTE — Telephone Encounter (Signed)
     Patient  visit on 11/28  at [place] you been able to follow up with your primary care physician? Yes   The patient was or was not able to obtain any needed medicine or equipment. Yes   Are there diet recommendations that you are having difficulty following? Na   Patient expresses understanding of discharge instructions and education provided has no other needs at this time.  Yes     [place], [facility], Care Management  [phone] [address], [place], [place] [address] Phone: [phone] Email: [email or web address]

## 2022-09-13 ENCOUNTER — Telehealth: Payer: Self-pay

## 2022-09-13 DIAGNOSIS — F03918 Unspecified dementia, unspecified severity, with other behavioral disturbance: Secondary | ICD-10-CM | POA: Diagnosis not present

## 2022-09-13 DIAGNOSIS — R531 Weakness: Secondary | ICD-10-CM | POA: Diagnosis not present

## 2022-09-13 DIAGNOSIS — F0394 Unspecified dementia, unspecified severity, with anxiety: Secondary | ICD-10-CM | POA: Diagnosis not present

## 2022-09-13 DIAGNOSIS — J208 Acute bronchitis due to other specified organisms: Secondary | ICD-10-CM | POA: Diagnosis not present

## 2022-09-13 DIAGNOSIS — I10 Essential (primary) hypertension: Secondary | ICD-10-CM | POA: Diagnosis not present

## 2022-09-13 DIAGNOSIS — R296 Repeated falls: Secondary | ICD-10-CM | POA: Diagnosis not present

## 2022-09-13 NOTE — Chronic Care Management (AMB) (Cosign Needed)
Chronic Care Management Pharmacy Assistant   Name: Mirtie Mance  MRN: [id number] DOB: 07-23-1935  Reason for Encounter: Hospital Follow Up non CCM   Medications: Outpatient Encounter Medications as of 09/13/2022  Medication Sig   Ascorbic Acid (VITAMIN C PO) Take 1 tablet by mouth daily.   aspirin (ASPIRIN LOW DOSE) 81 MG EC tablet TAKE 1 TABLET BY MOUTH ONCE DAILY   atorvastatin (LIPITOR) 40 MG tablet TAKE 1 TABLET BY MOUTH DAILY FOR CHOLESTEROL   benzonatate (TESSALON) 200 MG capsule Take 1 capsule (200 mg total) by mouth 3 (three) times daily as needed for cough.   Calcium Citrate-Vitamin D3 315-6.25 MG-MCG TABS Take 1 tablet by mouth daily.   docusate sodium (COLACE) 100 MG capsule Take 100 mg by mouth daily as needed for mild constipation.   donepezil (ARICEPT) 10 MG tablet Take 1 tablet (10 mg total) by mouth at bedtime. For memory.   ferrous sulfate 325 (65 FE) MG tablet Take 325 mg by mouth daily.   fluticasone (FLONASE) 50 MCG/ACT nasal spray INSTILL 1 SPRAY IN EACH NOSTRIL TWICE DAILY AS NEEDED FOR ALLERGIES (Patient taking differently: Place 1 spray into both nostrils 2 (two) times daily as needed for allergies.)   hydrOXYzine (ATARAX) 10 MG tablet TAKE 1 TO 2 TABLETS BY MOUTH DAILY AS NEEDED FOR ANXIETY AND TRAVEL   latanoprost (XALATAN) 0.005 % ophthalmic solution Place 1 drop into both eyes at bedtime.   lisinopril (ZESTRIL) 5 MG tablet Take 1 tablet (5 mg total) by mouth daily. for blood pressure.   loratadine (CLARITIN) 10 MG tablet Take 10 mg by mouth daily.   memantine (NAMENDA XR) 28 MG CP24 24 hr capsule Take by mouth.   Multiple Vitamin (MULTI-VITAMINS) TABS Take 1 tablet by mouth daily.   Omega-3 Fatty Acids (SEA-OMEGA 30) 1200 MG CAPS Take 1 capsule by mouth daily.   omeprazole (PRILOSEC) 20 MG capsule Take 1 capsule (20 mg total) by mouth daily. For heartburn   polyethylene glycol powder (GLYCOLAX/MIRALAX) 17 GM/SCOOP powder Take 17 g by mouth daily as needed for  mild constipation. Pt is  taking at every day   risperiDONE (RISPERDAL) 1 MG tablet Take 2 mg by mouth at bedtime.   sertraline (ZOLOFT) 25 MG tablet Take 1 tablet (25 mg total) by mouth daily. Take with '50mg'$  dose.   sertraline (ZOLOFT) 50 MG tablet Take 1 tablet (50 mg total) by mouth daily. For anxiety. Take with 25 mg dose.   TOVIAZ 8 MG TB24 tablet Take 1 tablet (8 mg total) by mouth daily. For overactive bladder   No facility-administered encounter medications on file as of 09/13/2022.      Reviewed hospital notes for details of recent visit. Has patient been contacted by Transitions of Care team? Yes Has patient seen PCP/specialist for hospital follow up (summarize OV if yes): No  Admitted to the ED on 09/07/22. Discharge date was 09/07/22.  Discharged from [facility] ED.   Discharge diagnosis (Principal Problem): weakness Patient was discharged to [facility] Home with Home Health  Brief summary of hospital course:  seen for weakness. We did an exam, labs, and imaging, and these showed no acute findings. We discussed with social work who will get home health and PT/OT to your house tomorrow. We also have placed an order for a hospital bed.pt's legs have been giving out and increased weakness. Likely will need SNF per daughter.    Medications that remain the same after Hospital Discharge:??  -  All other medications will remain the same.    Next CCM appt: none  Other upcoming appts: PCP appointment on 09/16/22  Charlene Brooke, PharmD notified and will determine if action is needed.  Avel Sensor, [place]  [phone]  Pharmacist addendum: Pt has spoken with [facility] team and has PCP appt scheduled this week. No further action needed.  Charlene Brooke, PharmD, BCACP 09/14/22 11:46 AM

## 2022-09-16 ENCOUNTER — Encounter: Payer: Self-pay | Admitting: Primary Care

## 2022-09-16 ENCOUNTER — Ambulatory Visit (INDEPENDENT_AMBULATORY_CARE_PROVIDER_SITE_OTHER): Payer: Medicare Other | Admitting: Primary Care

## 2022-09-16 VITALS — BP 164/72 | HR 73 | Temp 97.9°F | Ht 67.0 in | Wt 181.0 lb

## 2022-09-16 DIAGNOSIS — R531 Weakness: Secondary | ICD-10-CM | POA: Insufficient documentation

## 2022-09-16 DIAGNOSIS — D329 Benign neoplasm of meninges, unspecified: Secondary | ICD-10-CM | POA: Diagnosis not present

## 2022-09-16 DIAGNOSIS — I10 Essential (primary) hypertension: Secondary | ICD-10-CM | POA: Diagnosis not present

## 2022-09-16 DIAGNOSIS — J208 Acute bronchitis due to other specified organisms: Secondary | ICD-10-CM | POA: Diagnosis not present

## 2022-09-16 DIAGNOSIS — F03918 Unspecified dementia, unspecified severity, with other behavioral disturbance: Secondary | ICD-10-CM | POA: Diagnosis not present

## 2022-09-16 DIAGNOSIS — R296 Repeated falls: Secondary | ICD-10-CM | POA: Diagnosis not present

## 2022-09-16 DIAGNOSIS — F0394 Unspecified dementia, unspecified severity, with anxiety: Secondary | ICD-10-CM | POA: Diagnosis not present

## 2022-09-16 HISTORY — DX: Weakness: R53.1

## 2022-09-16 NOTE — Assessment & Plan Note (Addendum)
Likely multifactorial including hospitalization with sepsis in March 2023 and general deconditioning from her sedentary lifestyle. Labs, imaging, notes reviewed from the ED. No infectious cause. She does follow with neurology.   Continue with home health physical therapy and nursing.  Orders placed for hospital bed, bedside commode, tray table, [place].  Addendum: 10/27/22 Orders placed for wheelchair.

## 2022-09-16 NOTE — Patient Instructions (Signed)
It was a pleasure to see you today!   

## 2022-09-16 NOTE — Progress Notes (Addendum)
Subjective:    Patient ID: Caroline French, female    DOB: 03-Aug-1935, 87 y.o.   MRN: [id number]  HPI  Caroline French is a very pleasant 87 y.o. female with a history of hypertension, pneumonia, dementia with behavioral disturbance, overactive bladder, AKI, hyperlipidemia, prediabetes, altered mental status, sepsis sleep apnea who presents today for ED follow-up.  Her daughter and caregiver join [place] today who are helping to provide information for HPI.   She presented to [facility] long ED on 09/07/2022 for a few days history of generalized weakness, lower extremity weakness, imbalance, difficulty climbing stairs in her home, 1 fall at home.   During her stay in the ED she underwent lab work. Troponin levels negative, CBC negative, CMP grossly negative.  Urinalysis revealed trace leukocytes without nitrites or blood. She underwent CT head which was negative. The ED physician recommended SNF placement for which family declined. She was referred to home health PT and nursing.  She was discharged home later that evening.  Since her ED visit she continues to experience intermittent lower extremity weakness and imbalance. She's been visited by PT and nursing from home health. She has been working on lower extremity strengthening exercises. Her caregiver has to assist. She leads a mostly sedentary life, hardly walks or moves when at home despite family and caregiver support.  Her daughter has noticed gradual decline since her hospitalization in March 2023 for sepsis.  Given her generalized weakness, her daughter is requesting a hospital bed, bedside commode, tray table across the bed, and butler for assistance with compression socks. She requires assistance with most ADL's including getting in and out of bed, toileting, bathing, dressing, for preparation.  She has a walker and cane at home for which she hardly uses.  Addendum: 09/28/22: Patient requires repositioning of the body in ways that cannot be achieved with  an ordinary bed or wedge pillow, to reduce pressure, to eliminate pain, and to get in and out of bed without falls. She hasgeneralized weakness, mental confusion, and imbalance.   Addendum: 10/27/22 Patient's daughter is requesting a wheelchair for her mother. She requires a wheelchair for her imbalance, falls, and inability to walk without monitoring.   Addendum: 12/13/22 Patient suffers from generalized weakness and dementia which impairs their ability to perform daily activities like bathing, dressing, feeding, grooming, and toileting in the home.  A cane, crutch, or walker will not resolve issue with performing activities of daily living. A wheelchair will allow patient to safely perform daily activities. Patient can safely propel the wheelchair in the home or has a caregiver who can provide assistance. Length of need Lifetime.  Accessories: elevating leg rests (ELRs), wheel locks, extensions and anti-tippers.    Review of Systems  Respiratory:  Negative for shortness of breath.   Cardiovascular:  Negative for chest pain.  Gastrointestinal:  Negative for abdominal pain and blood in stool.  Musculoskeletal:  Positive for arthralgias.  Neurological:  Positive for weakness.         Past Medical History:  Diagnosis Date   Breast cancer [facility]) [id number]   left breast   Dementia with behavioral disturbance ([place])    Diverticulitis    Essential hypertension    Glaucoma    Hallucinations    Overactive bladder     Social History   Socioeconomic History   Marital status: Widowed    Spouse name: Not on file   Number of children: Not on file   Years of education: Not on file  Highest education level: Not on file  Occupational History   Not on file  Tobacco Use   Smoking status: Never   Smokeless tobacco: Never  Vaping Use   Vaping Use: Never used  Substance and Sexual Activity   Alcohol use: Never   Drug use: Never   Sexual activity: Not Currently  Other Topics Concern   Not  on file  Social History Narrative   Widow.   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   Moved from [place].   Social Determinants of Health   Financial Resource Strain: Low Risk  (03/30/2022)   Overall Financial Resource Strain (CARDIA)    Difficulty of Paying Living Expenses: Not hard at all  Food Insecurity: No Food Insecurity (03/30/2022)   Hunger Vital Sign    Worried About Running Out of Food in the Last Year: Never true    Ran Out of Food in the Last Year: Never true  Transportation Needs: No Transportation Needs (03/30/2022)   PRAPARE - Hydrologist (Medical): No    Lack of Transportation (Non-Medical): No  Physical Activity: Insufficiently Active (03/30/2022)   Exercise Vital Sign    Days of Exercise per Week: 1 day    Minutes of Exercise per Session: 60 min  Stress: No Stress Concern Present (03/30/2022)   [place]    Feeling of Stress : Not at all  Social Connections: [place] (03/30/2022)   Social Connection and Isolation Panel [NHANES]    Frequency of Communication with Friends and Family: More than three times a week    Frequency of Social Gatherings with Friends and Family: More than three times a week    Attends Religious Services: More than 4 times per year    Active Member of [employer] or Organizations: Yes    Attends Music therapist: More than 4 times per year    Marital Status: Married  Human resources officer Violence: Not At Risk (03/30/2022)   Humiliation, Afraid, Rape, and Kick questionnaire    Fear of Current or Ex-Partner: No    Emotionally Abused: No    Physically Abused: No    Sexually Abused: No    Past Surgical History:  Procedure Laterality Date   ABDOMINAL HYSTERECTOMY     BREAST BIOPSY     BREAST LUMPECTOMY Left 1990's   CATARACT EXTRACTION, BILATERAL  08/012020    History reviewed. No pertinent family history.  Allergies  Allergen Reactions    Penicillins Rash    Did it involve swelling of the face/tongue/throat, SOB, or low BP? Yes Did it involve sudden or severe rash/hives, skin peeling, or any reaction on the inside of your mouth or nose? Unk Did you need to seek medical attention at a hospital or doctor's office? Unk When did it last happen? "I was young" If all above answers are "NO", may proceed with cephalosporin use.     Current Outpatient Medications on File Prior to Visit  Medication Sig Dispense Refill   Ascorbic Acid (VITAMIN C PO) Take 1 tablet by mouth daily.     aspirin (ASPIRIN LOW DOSE) 81 MG EC tablet TAKE 1 TABLET BY MOUTH ONCE DAILY 90 tablet 3   atorvastatin (LIPITOR) 40 MG tablet TAKE 1 TABLET BY MOUTH DAILY FOR CHOLESTEROL 90 tablet 3   Calcium Citrate-Vitamin D3 315-6.25 MG-MCG TABS Take 1 tablet by mouth daily.     docusate sodium (COLACE) 100 MG capsule Take 100 mg by  mouth daily as needed for mild constipation.     donepezil (ARICEPT) 10 MG tablet Take 1 tablet (10 mg total) by mouth at bedtime. For memory. 90 tablet 3   ferrous sulfate 325 (65 FE) MG tablet Take 325 mg by mouth daily.     fluticasone (FLONASE) 50 MCG/ACT nasal spray INSTILL 1 SPRAY IN EACH NOSTRIL TWICE DAILY AS NEEDED FOR ALLERGIES (Patient taking differently: Place 1 spray into both nostrils 2 (two) times daily as needed for allergies.) 48 g 1   latanoprost (XALATAN) 0.005 % ophthalmic solution Place 1 drop into both eyes at bedtime.     lisinopril (ZESTRIL) 5 MG tablet Take 1 tablet (5 mg total) by mouth daily. for blood pressure. 90 tablet 3   loratadine (CLARITIN) 10 MG tablet Take 10 mg by mouth daily.     memantine (NAMENDA XR) 28 MG CP24 24 hr capsule Take by mouth.     Multiple Vitamin (MULTI-VITAMINS) TABS Take 1 tablet by mouth daily.  11   omeprazole (PRILOSEC) 20 MG capsule Take 1 capsule (20 mg total) by mouth daily. For heartburn 90 capsule 3   polyethylene glycol powder (GLYCOLAX/MIRALAX) 17 GM/SCOOP powder Take 17 g  by mouth daily as needed for mild constipation. Pt is  taking at every day     risperiDONE (RISPERDAL) 1 MG tablet Take 2 mg by mouth at bedtime.     sertraline (ZOLOFT) 25 MG tablet Take 1 tablet (25 mg total) by mouth daily. Take with '50mg'$  dose. 90 tablet 3   sertraline (ZOLOFT) 50 MG tablet Take 1 tablet (50 mg total) by mouth daily. For anxiety. Take with 25 mg dose. 90 tablet 3   TOVIAZ 8 MG TB24 tablet Take 1 tablet (8 mg total) by mouth daily. For overactive bladder 90 tablet 3   benzonatate (TESSALON) 200 MG capsule Take 1 capsule (200 mg total) by mouth 3 (three) times daily as needed for cough. (Patient not taking: Reported on 09/16/2022) 40 capsule 1   Omega-3 Fatty Acids (SEA-OMEGA 30) 1200 MG CAPS Take 1 capsule by mouth daily. (Patient not taking: Reported on 09/16/2022)  11   No current facility-administered medications on file prior to visit.    BP (!) 164/72   Pulse 73   Temp 97.9 F (36.6 C) (Temporal)   Ht '5\' 7"'$  (1.702 m)   Wt 181 lb (82.1 kg)   SpO2 99%   BMI 28.35 kg/m  Objective:   Physical Exam Cardiovascular:     Rate and Rhythm: Normal rate and regular rhythm.  Pulmonary:     Effort: Pulmonary effort is normal.     Breath sounds: Normal breath sounds.  Abdominal:     General: Bowel sounds are normal.     Palpations: Abdomen is soft.     Tenderness: There is no abdominal tenderness.  Musculoskeletal:     Cervical back: Neck supple.     Comments: Gait overall steady but with mild shuffling of her feet and small steps.  She ambulates today without an assistive device.  Skin:    General: Skin is warm and dry.     Comments: Tennis ball sized mass noted to left frontal lobe, firm, immobile, non tender.  Neurological:     Mental Status: She is alert.     Comments: Follows commands but does not answer questions correctly during HPI  Psychiatric:        Mood and Affect: Mood normal.  Assessment & Plan:   Problem List Items Addressed This Visit        Nervous and Auditory   Meningioma ([place])    Chronic per daughter.  Reviewed MRI from 2021 per Care Everywhere which reveals left frontal convexity meningioma without change.  Her neurologist has evaluated the spot.   Family and patient does not wish to pursue further.        Other   Generalized weakness - Primary    Likely multifactorial including hospitalization with sepsis in March 2023 and general deconditioning from her sedentary lifestyle. Labs, imaging, notes reviewed from the ED. No infectious cause. She does follow with neurology.   Continue with home health physical therapy and nursing.  Orders placed for hospital bed, bedside commode, tray table, [place].  Addendum: 10/27/22 Orders placed for wheelchair.      Relevant Orders   For home use only DME Other see comment       Pleas Koch, NP

## 2022-09-16 NOTE — Assessment & Plan Note (Addendum)
Chronic per daughter.  Reviewed MRI from 2021 per [facility] Everywhere which reveals left frontal convexity meningioma without change.  Her neurologist has evaluated the spot.   Family and patient does not wish to pursue further.

## 2022-09-17 DIAGNOSIS — J208 Acute bronchitis due to other specified organisms: Secondary | ICD-10-CM | POA: Diagnosis not present

## 2022-09-17 DIAGNOSIS — F03918 Unspecified dementia, unspecified severity, with other behavioral disturbance: Secondary | ICD-10-CM | POA: Diagnosis not present

## 2022-09-17 DIAGNOSIS — F0394 Unspecified dementia, unspecified severity, with anxiety: Secondary | ICD-10-CM | POA: Diagnosis not present

## 2022-09-17 DIAGNOSIS — I10 Essential (primary) hypertension: Secondary | ICD-10-CM | POA: Diagnosis not present

## 2022-09-17 DIAGNOSIS — R296 Repeated falls: Secondary | ICD-10-CM | POA: Diagnosis not present

## 2022-09-17 DIAGNOSIS — R531 Weakness: Secondary | ICD-10-CM | POA: Diagnosis not present

## 2022-09-22 DIAGNOSIS — R531 Weakness: Secondary | ICD-10-CM | POA: Diagnosis not present

## 2022-09-22 DIAGNOSIS — I10 Essential (primary) hypertension: Secondary | ICD-10-CM | POA: Diagnosis not present

## 2022-09-22 DIAGNOSIS — F03918 Unspecified dementia, unspecified severity, with other behavioral disturbance: Secondary | ICD-10-CM | POA: Diagnosis not present

## 2022-09-22 DIAGNOSIS — J208 Acute bronchitis due to other specified organisms: Secondary | ICD-10-CM | POA: Diagnosis not present

## 2022-09-22 DIAGNOSIS — R296 Repeated falls: Secondary | ICD-10-CM | POA: Diagnosis not present

## 2022-09-22 DIAGNOSIS — F0394 Unspecified dementia, unspecified severity, with anxiety: Secondary | ICD-10-CM | POA: Diagnosis not present

## 2022-09-28 DIAGNOSIS — F03918 Unspecified dementia, unspecified severity, with other behavioral disturbance: Secondary | ICD-10-CM | POA: Diagnosis not present

## 2022-09-28 DIAGNOSIS — R296 Repeated falls: Secondary | ICD-10-CM | POA: Diagnosis not present

## 2022-09-28 DIAGNOSIS — F0394 Unspecified dementia, unspecified severity, with anxiety: Secondary | ICD-10-CM | POA: Diagnosis not present

## 2022-09-28 DIAGNOSIS — I10 Essential (primary) hypertension: Secondary | ICD-10-CM | POA: Diagnosis not present

## 2022-09-28 DIAGNOSIS — J208 Acute bronchitis due to other specified organisms: Secondary | ICD-10-CM | POA: Diagnosis not present

## 2022-09-28 DIAGNOSIS — R531 Weakness: Secondary | ICD-10-CM | POA: Diagnosis not present

## 2022-10-06 DIAGNOSIS — F0394 Unspecified dementia, unspecified severity, with anxiety: Secondary | ICD-10-CM | POA: Diagnosis not present

## 2022-10-06 DIAGNOSIS — J208 Acute bronchitis due to other specified organisms: Secondary | ICD-10-CM | POA: Diagnosis not present

## 2022-10-06 DIAGNOSIS — R531 Weakness: Secondary | ICD-10-CM | POA: Diagnosis not present

## 2022-10-06 DIAGNOSIS — I10 Essential (primary) hypertension: Secondary | ICD-10-CM | POA: Diagnosis not present

## 2022-10-06 DIAGNOSIS — R296 Repeated falls: Secondary | ICD-10-CM | POA: Diagnosis not present

## 2022-10-06 DIAGNOSIS — F03918 Unspecified dementia, unspecified severity, with other behavioral disturbance: Secondary | ICD-10-CM | POA: Diagnosis not present

## 2022-10-07 DIAGNOSIS — I10 Essential (primary) hypertension: Secondary | ICD-10-CM | POA: Diagnosis not present

## 2022-10-07 DIAGNOSIS — J208 Acute bronchitis due to other specified organisms: Secondary | ICD-10-CM | POA: Diagnosis not present

## 2022-10-07 DIAGNOSIS — F0394 Unspecified dementia, unspecified severity, with anxiety: Secondary | ICD-10-CM | POA: Diagnosis not present

## 2022-10-07 DIAGNOSIS — F03918 Unspecified dementia, unspecified severity, with other behavioral disturbance: Secondary | ICD-10-CM | POA: Diagnosis not present

## 2022-10-07 DIAGNOSIS — R531 Weakness: Secondary | ICD-10-CM | POA: Diagnosis not present

## 2022-10-07 DIAGNOSIS — R296 Repeated falls: Secondary | ICD-10-CM | POA: Diagnosis not present

## 2022-10-09 DIAGNOSIS — F03918 Unspecified dementia, unspecified severity, with other behavioral disturbance: Secondary | ICD-10-CM | POA: Diagnosis not present

## 2022-10-09 DIAGNOSIS — I2510 Atherosclerotic heart disease of native coronary artery without angina pectoris: Secondary | ICD-10-CM | POA: Diagnosis not present

## 2022-10-09 DIAGNOSIS — G4730 Sleep apnea, unspecified: Secondary | ICD-10-CM | POA: Diagnosis not present

## 2022-10-09 DIAGNOSIS — K5792 Diverticulitis of intestine, part unspecified, without perforation or abscess without bleeding: Secondary | ICD-10-CM | POA: Diagnosis not present

## 2022-10-09 DIAGNOSIS — J208 Acute bronchitis due to other specified organisms: Secondary | ICD-10-CM | POA: Diagnosis not present

## 2022-10-09 DIAGNOSIS — I10 Essential (primary) hypertension: Secondary | ICD-10-CM | POA: Diagnosis not present

## 2022-10-09 DIAGNOSIS — H409 Unspecified glaucoma: Secondary | ICD-10-CM | POA: Diagnosis not present

## 2022-10-09 DIAGNOSIS — R7303 Prediabetes: Secondary | ICD-10-CM | POA: Diagnosis not present

## 2022-10-09 DIAGNOSIS — Z8616 Personal history of COVID-19: Secondary | ICD-10-CM | POA: Diagnosis not present

## 2022-10-09 DIAGNOSIS — R531 Weakness: Secondary | ICD-10-CM | POA: Diagnosis not present

## 2022-10-09 DIAGNOSIS — N3281 Overactive bladder: Secondary | ICD-10-CM | POA: Diagnosis not present

## 2022-10-09 DIAGNOSIS — Z853 Personal history of malignant neoplasm of breast: Secondary | ICD-10-CM | POA: Diagnosis not present

## 2022-10-09 DIAGNOSIS — E7800 Pure hypercholesterolemia, unspecified: Secondary | ICD-10-CM | POA: Diagnosis not present

## 2022-10-09 DIAGNOSIS — R296 Repeated falls: Secondary | ICD-10-CM | POA: Diagnosis not present

## 2022-10-09 DIAGNOSIS — F0394 Unspecified dementia, unspecified severity, with anxiety: Secondary | ICD-10-CM | POA: Diagnosis not present

## 2022-10-18 DIAGNOSIS — J208 Acute bronchitis due to other specified organisms: Secondary | ICD-10-CM | POA: Diagnosis not present

## 2022-10-18 DIAGNOSIS — F03918 Unspecified dementia, unspecified severity, with other behavioral disturbance: Secondary | ICD-10-CM | POA: Diagnosis not present

## 2022-10-18 DIAGNOSIS — F0394 Unspecified dementia, unspecified severity, with anxiety: Secondary | ICD-10-CM | POA: Diagnosis not present

## 2022-10-18 DIAGNOSIS — R531 Weakness: Secondary | ICD-10-CM | POA: Diagnosis not present

## 2022-10-18 DIAGNOSIS — R296 Repeated falls: Secondary | ICD-10-CM | POA: Diagnosis not present

## 2022-10-18 DIAGNOSIS — I10 Essential (primary) hypertension: Secondary | ICD-10-CM | POA: Diagnosis not present

## 2022-10-19 ENCOUNTER — Other Ambulatory Visit: Payer: Self-pay | Admitting: Primary Care

## 2022-10-19 DIAGNOSIS — J208 Acute bronchitis due to other specified organisms: Secondary | ICD-10-CM | POA: Diagnosis not present

## 2022-10-19 DIAGNOSIS — F418 Other specified anxiety disorders: Secondary | ICD-10-CM

## 2022-10-19 DIAGNOSIS — F03918 Unspecified dementia, unspecified severity, with other behavioral disturbance: Secondary | ICD-10-CM | POA: Diagnosis not present

## 2022-10-19 DIAGNOSIS — F0394 Unspecified dementia, unspecified severity, with anxiety: Secondary | ICD-10-CM | POA: Diagnosis not present

## 2022-10-19 DIAGNOSIS — I10 Essential (primary) hypertension: Secondary | ICD-10-CM | POA: Diagnosis not present

## 2022-10-19 DIAGNOSIS — R531 Weakness: Secondary | ICD-10-CM | POA: Diagnosis not present

## 2022-10-19 DIAGNOSIS — R296 Repeated falls: Secondary | ICD-10-CM | POA: Diagnosis not present

## 2022-10-21 ENCOUNTER — Telehealth: Payer: Self-pay | Admitting: Primary Care

## 2022-10-21 DIAGNOSIS — F0394 Unspecified dementia, unspecified severity, with anxiety: Secondary | ICD-10-CM | POA: Diagnosis not present

## 2022-10-21 DIAGNOSIS — J208 Acute bronchitis due to other specified organisms: Secondary | ICD-10-CM | POA: Diagnosis not present

## 2022-10-21 DIAGNOSIS — R531 Weakness: Secondary | ICD-10-CM | POA: Diagnosis not present

## 2022-10-21 DIAGNOSIS — R296 Repeated falls: Secondary | ICD-10-CM | POA: Diagnosis not present

## 2022-10-21 DIAGNOSIS — I10 Essential (primary) hypertension: Secondary | ICD-10-CM | POA: Diagnosis not present

## 2022-10-21 DIAGNOSIS — F03918 Unspecified dementia, unspecified severity, with other behavioral disturbance: Secondary | ICD-10-CM | POA: Diagnosis not present

## 2022-10-21 NOTE — Telephone Encounter (Signed)
Pt's daughter, [name], called stating the pt has some ppw for [place] to complete so she can start getting transportation to & from appts. [name] stated its beginning to get difficult to transport her mom herself to visits. [name] stated she'd drop off ppw for [place] to comp. Call back # [phone]

## 2022-10-21 NOTE — Telephone Encounter (Signed)
Placed in [place] box for review and completion

## 2022-10-21 NOTE — Telephone Encounter (Signed)
Patient daughter came by and dropped off these forms. She also wanted to know how would she go about receiving a wheelchair for her mom?

## 2022-10-22 ENCOUNTER — Telehealth: Payer: Self-pay | Admitting: Primary Care

## 2022-10-22 DIAGNOSIS — F418 Other specified anxiety disorders: Secondary | ICD-10-CM

## 2022-10-22 DIAGNOSIS — F03918 Unspecified dementia, unspecified severity, with other behavioral disturbance: Secondary | ICD-10-CM

## 2022-10-22 MED ORDER — HYDROXYZINE HCL 10 MG PO TABS: 10.0000 mg | ORAL_TABLET | Freq: Every day | ORAL | 0 refills | Status: DC | PRN

## 2022-10-22 NOTE — Telephone Encounter (Signed)
Patients daughter called in stating that her mom hasn't had a bowel movement in about a week now,and they have been giving her everything over the counter from prune juice to laxatives and nothing is happening. She would like advice on anything else that she can possibly do?

## 2022-10-22 NOTE — Telephone Encounter (Signed)
Prescription Request  10/22/2022  Is this a "Controlled Substance" medicine? No  LOV: 09/16/2022  What is the name of the medication or equipment? hydrOXYzine (ATARAX) 10 MG tablet   Have you contacted your pharmacy to request a refill? Yes   Which pharmacy would you like this sent to?  [id number] [place] [address] [place] [address]Phone: [phone]: [phone]   Patient notified that their request is being sent to the clinical staff for review and that they should receive a response within 2 business days.   Please advise at Mobile [phone](mobile)

## 2022-10-22 NOTE — Telephone Encounter (Signed)
Called patients daughter and notified her of Clearence Cheek message. She will give this a try and let us know if she needs anything further.

## 2022-10-22 NOTE — Telephone Encounter (Signed)
Is she taking:  Stool softener twice daily. Miralax twice daily?  Any rectal pain or pressure from the patient?  If she's been taking both Miralax and stool softeners then recommend Enema. She can find this OTC.

## 2022-10-22 NOTE — Telephone Encounter (Signed)
She could give patient 1-2 of the hydroxyzine tablets first to help reduce anxiety then try the enema. It doesn't sound like an fecal impaction which is good.

## 2022-10-22 NOTE — Telephone Encounter (Signed)
Completed and placed in [place] in basket.

## 2022-10-22 NOTE — Telephone Encounter (Signed)
Called patients daughter she has been doing stool softeners and Miralax. She is not having any rectal pain or pressure.  Advised of Clearence Cheek recommendation to try an enema. Patients daughter stated she did not think that would be possible for her to do on the patient, she stated she can't get her to lay on her side or lay still with all of her anxiety. Recommended she check and see if her home health nurse is able to assist with this. She stated she was considering taking patient to ER, but would try to get someone to help. She wanted to know if there is anything else besides an enema she could do before taking patient to ER.

## 2022-10-25 NOTE — Telephone Encounter (Signed)
Called and notified patient daughter that form is ready for pickup.  She would like a DME order placed for a wheelchair.

## 2022-10-26 DIAGNOSIS — R296 Repeated falls: Secondary | ICD-10-CM | POA: Diagnosis not present

## 2022-10-26 DIAGNOSIS — I10 Essential (primary) hypertension: Secondary | ICD-10-CM | POA: Diagnosis not present

## 2022-10-26 DIAGNOSIS — F0394 Unspecified dementia, unspecified severity, with anxiety: Secondary | ICD-10-CM | POA: Diagnosis not present

## 2022-10-26 DIAGNOSIS — R531 Weakness: Secondary | ICD-10-CM | POA: Diagnosis not present

## 2022-10-26 DIAGNOSIS — J208 Acute bronchitis due to other specified organisms: Secondary | ICD-10-CM | POA: Diagnosis not present

## 2022-10-26 DIAGNOSIS — F03918 Unspecified dementia, unspecified severity, with other behavioral disturbance: Secondary | ICD-10-CM | POA: Diagnosis not present

## 2022-10-26 NOTE — Telephone Encounter (Signed)
Received a message from [facility] team they need [id number] notes for need of wheelchair. Verified it is acceptable to add this to the notes from 09/16/22.   Can you please make an addendum to office visit notes from 09/16/22 to add narrative for wheelchair? Thanks!

## 2022-10-26 NOTE — Addendum Note (Signed)
Addended by: Pleas Koch on: 10/26/2022 06:55 AM   Modules accepted: Orders

## 2022-10-26 NOTE — Telephone Encounter (Signed)
Community message has been placed notifying Caroline French.

## 2022-10-26 NOTE — Telephone Encounter (Signed)
Noted, DME order placed.

## 2022-10-27 NOTE — Telephone Encounter (Signed)
Noted. Addendum completed.

## 2022-10-27 NOTE — Telephone Encounter (Signed)
[facility] [employer] team made aware.

## 2022-11-01 ENCOUNTER — Telehealth: Payer: Self-pay | Admitting: Primary Care

## 2022-11-01 DIAGNOSIS — R296 Repeated falls: Secondary | ICD-10-CM | POA: Diagnosis not present

## 2022-11-01 DIAGNOSIS — I10 Essential (primary) hypertension: Secondary | ICD-10-CM | POA: Diagnosis not present

## 2022-11-01 DIAGNOSIS — R531 Weakness: Secondary | ICD-10-CM | POA: Diagnosis not present

## 2022-11-01 DIAGNOSIS — F0394 Unspecified dementia, unspecified severity, with anxiety: Secondary | ICD-10-CM | POA: Diagnosis not present

## 2022-11-01 DIAGNOSIS — F03918 Unspecified dementia, unspecified severity, with other behavioral disturbance: Secondary | ICD-10-CM | POA: Diagnosis not present

## 2022-11-01 DIAGNOSIS — J208 Acute bronchitis due to other specified organisms: Secondary | ICD-10-CM | POA: Diagnosis not present

## 2022-11-01 NOTE — Telephone Encounter (Signed)
Sonya called from [facility] called to check on status of orders of plan of care and order to PT visits. Call back number [phone].

## 2022-11-01 NOTE — Telephone Encounter (Signed)
Called and notified Sonya from [facility] that we have received orders, just awaiting providers approval.

## 2022-11-04 DIAGNOSIS — F03918 Unspecified dementia, unspecified severity, with other behavioral disturbance: Secondary | ICD-10-CM | POA: Diagnosis not present

## 2022-11-04 DIAGNOSIS — F0394 Unspecified dementia, unspecified severity, with anxiety: Secondary | ICD-10-CM | POA: Diagnosis not present

## 2022-11-04 DIAGNOSIS — I10 Essential (primary) hypertension: Secondary | ICD-10-CM | POA: Diagnosis not present

## 2022-11-04 DIAGNOSIS — R531 Weakness: Secondary | ICD-10-CM | POA: Diagnosis not present

## 2022-11-04 DIAGNOSIS — R296 Repeated falls: Secondary | ICD-10-CM | POA: Diagnosis not present

## 2022-11-04 DIAGNOSIS — J208 Acute bronchitis due to other specified organisms: Secondary | ICD-10-CM | POA: Diagnosis not present

## 2022-11-05 DIAGNOSIS — F03918 Unspecified dementia, unspecified severity, with other behavioral disturbance: Secondary | ICD-10-CM | POA: Diagnosis not present

## 2022-11-05 DIAGNOSIS — R296 Repeated falls: Secondary | ICD-10-CM | POA: Diagnosis not present

## 2022-11-05 DIAGNOSIS — J208 Acute bronchitis due to other specified organisms: Secondary | ICD-10-CM | POA: Diagnosis not present

## 2022-11-05 DIAGNOSIS — R531 Weakness: Secondary | ICD-10-CM | POA: Diagnosis not present

## 2022-11-05 DIAGNOSIS — F0394 Unspecified dementia, unspecified severity, with anxiety: Secondary | ICD-10-CM | POA: Diagnosis not present

## 2022-11-05 DIAGNOSIS — I10 Essential (primary) hypertension: Secondary | ICD-10-CM | POA: Diagnosis not present

## 2022-11-08 ENCOUNTER — Other Ambulatory Visit: Payer: Self-pay | Admitting: Primary Care

## 2022-11-08 DIAGNOSIS — I2510 Atherosclerotic heart disease of native coronary artery without angina pectoris: Secondary | ICD-10-CM | POA: Diagnosis not present

## 2022-11-08 DIAGNOSIS — I10 Essential (primary) hypertension: Secondary | ICD-10-CM | POA: Diagnosis not present

## 2022-11-08 DIAGNOSIS — F0394 Unspecified dementia, unspecified severity, with anxiety: Secondary | ICD-10-CM | POA: Diagnosis not present

## 2022-11-08 DIAGNOSIS — Z8616 Personal history of COVID-19: Secondary | ICD-10-CM | POA: Diagnosis not present

## 2022-11-08 DIAGNOSIS — G4730 Sleep apnea, unspecified: Secondary | ICD-10-CM | POA: Diagnosis not present

## 2022-11-08 DIAGNOSIS — K5792 Diverticulitis of intestine, part unspecified, without perforation or abscess without bleeding: Secondary | ICD-10-CM | POA: Diagnosis not present

## 2022-11-08 DIAGNOSIS — N3281 Overactive bladder: Secondary | ICD-10-CM | POA: Diagnosis not present

## 2022-11-08 DIAGNOSIS — R531 Weakness: Secondary | ICD-10-CM | POA: Diagnosis not present

## 2022-11-08 DIAGNOSIS — Z853 Personal history of malignant neoplasm of breast: Secondary | ICD-10-CM | POA: Diagnosis not present

## 2022-11-08 DIAGNOSIS — Z1231 Encounter for screening mammogram for malignant neoplasm of breast: Secondary | ICD-10-CM

## 2022-11-08 DIAGNOSIS — H409 Unspecified glaucoma: Secondary | ICD-10-CM | POA: Diagnosis not present

## 2022-11-08 DIAGNOSIS — F03918 Unspecified dementia, unspecified severity, with other behavioral disturbance: Secondary | ICD-10-CM | POA: Diagnosis not present

## 2022-11-08 DIAGNOSIS — E7800 Pure hypercholesterolemia, unspecified: Secondary | ICD-10-CM | POA: Diagnosis not present

## 2022-11-08 DIAGNOSIS — R7303 Prediabetes: Secondary | ICD-10-CM | POA: Diagnosis not present

## 2022-11-08 DIAGNOSIS — R296 Repeated falls: Secondary | ICD-10-CM | POA: Diagnosis not present

## 2022-11-15 ENCOUNTER — Telehealth: Payer: Self-pay | Admitting: Primary Care

## 2022-11-15 DIAGNOSIS — R441 Visual hallucinations: Secondary | ICD-10-CM | POA: Diagnosis not present

## 2022-11-15 DIAGNOSIS — F411 Generalized anxiety disorder: Secondary | ICD-10-CM | POA: Diagnosis not present

## 2022-11-15 DIAGNOSIS — R413 Other amnesia: Secondary | ICD-10-CM | POA: Diagnosis not present

## 2022-11-15 DIAGNOSIS — R296 Repeated falls: Secondary | ICD-10-CM | POA: Diagnosis not present

## 2022-11-15 NOTE — Telephone Encounter (Signed)
Tom from [facility] called to let Anda Kraft know that patient  missed OT on 11/12/2022

## 2022-11-15 NOTE — Telephone Encounter (Signed)
Noted  

## 2022-11-18 DIAGNOSIS — R296 Repeated falls: Secondary | ICD-10-CM | POA: Diagnosis not present

## 2022-11-18 DIAGNOSIS — I10 Essential (primary) hypertension: Secondary | ICD-10-CM | POA: Diagnosis not present

## 2022-11-18 DIAGNOSIS — F03918 Unspecified dementia, unspecified severity, with other behavioral disturbance: Secondary | ICD-10-CM | POA: Diagnosis not present

## 2022-11-18 DIAGNOSIS — F0394 Unspecified dementia, unspecified severity, with anxiety: Secondary | ICD-10-CM | POA: Diagnosis not present

## 2022-11-18 DIAGNOSIS — I2510 Atherosclerotic heart disease of native coronary artery without angina pectoris: Secondary | ICD-10-CM | POA: Diagnosis not present

## 2022-11-18 DIAGNOSIS — R531 Weakness: Secondary | ICD-10-CM | POA: Diagnosis not present

## 2022-11-22 DIAGNOSIS — R531 Weakness: Secondary | ICD-10-CM | POA: Diagnosis not present

## 2022-11-22 DIAGNOSIS — R296 Repeated falls: Secondary | ICD-10-CM | POA: Diagnosis not present

## 2022-11-22 DIAGNOSIS — F0394 Unspecified dementia, unspecified severity, with anxiety: Secondary | ICD-10-CM | POA: Diagnosis not present

## 2022-11-22 DIAGNOSIS — I2510 Atherosclerotic heart disease of native coronary artery without angina pectoris: Secondary | ICD-10-CM | POA: Diagnosis not present

## 2022-11-22 DIAGNOSIS — I10 Essential (primary) hypertension: Secondary | ICD-10-CM | POA: Diagnosis not present

## 2022-11-22 DIAGNOSIS — F03918 Unspecified dementia, unspecified severity, with other behavioral disturbance: Secondary | ICD-10-CM | POA: Diagnosis not present

## 2022-11-25 ENCOUNTER — Encounter: Payer: Self-pay | Admitting: Internal Medicine

## 2022-11-25 ENCOUNTER — Ambulatory Visit (INDEPENDENT_AMBULATORY_CARE_PROVIDER_SITE_OTHER): Payer: Medicare Other | Admitting: Internal Medicine

## 2022-11-25 VITALS — BP 128/72 | HR 75 | Temp 98.2°F | Ht 67.0 in | Wt 175.0 lb

## 2022-11-25 DIAGNOSIS — G4733 Obstructive sleep apnea (adult) (pediatric): Secondary | ICD-10-CM

## 2022-11-25 NOTE — Progress Notes (Signed)
Name: Caroline French MRN: [id number] DOB: 1935/03/16     CHIEF COMPLAINT:  Follow up OSA    HISTORY OF PRESENT ILLNESS: Patient has a diagnosis of severe sleep apnea with AHI of 28 Patient doing well with her CPAP machine Auto CPAP 5-20 AHI is down to 3 100% compliance for days and greater than 4 hours Patient uses and benefots from therapy  Patient has progressively declined over last 1 year Mentally and physically according to daughter    Discussed sleep data and reviewed with patient.   No evidence of heart failure at this time No evidence or signs of infection at this time No respiratory distress No fevers, chills, nausea, vomiting, diarrhea No evidence of lower extremity edema No evidence hemoptysis    PAST MEDICAL HISTORY :   has a past medical history of Breast cancer ([place]) (1992's), Dementia with behavioral disturbance ([place]), Diverticulitis, Essential hypertension, Glaucoma, Hallucinations, and Overactive bladder.  has a past surgical history that includes Breast biopsy; Abdominal hysterectomy; Breast lumpectomy (Left, 1990's); and Cataract extraction, bilateral (08/012020). Prior to Admission medications   Medication Sig Start Date End Date Taking? Authorizing Provider  ASPIRIN LOW DOSE 81 MG EC tablet Take 1 tablet (81 mg total) by mouth daily. 10/03/18   Pleas Koch, NP  atorvastatin (LIPITOR) 40 MG tablet Take 1 tablet (40 mg total) by mouth at bedtime. 09/29/18   Pleas Koch, NP  CALCIUM CITRATE + D 315-200 MG-UNIT tablet Take 1 tablet by mouth daily. 01/11/18   [provider]  donepezil (ARICEPT) 5 MG tablet Take 1 tablet by mouth every evening for memory. 07/21/18   Pleas Koch, NP  dorzolamide-timolol (COSOPT) 22.3-6.8 MG/ML ophthalmic solution INSTILL 1 DROP INTO BOTH EYES TWICE A DAY AS DIRECTED 12/20/17   [provider]  fluticasone (FLONASE) 50 MCG/ACT nasal spray Place 1 spray into both nostrils 2 (two) times daily.  As needed for allergies. 10/03/18   Pleas Koch, NP  latanoprost (XALATAN) 0.005 % ophthalmic solution INSTILL 1 DROP TO BOTH EYES EVERY EVENING AT BEDTIME 11/10/17   [provider]  lisinopril (PRINIVIL,ZESTRIL) 5 MG tablet Take 5 mg by mouth every morning. 01/11/18   [provider]  memantine (NAMENDA XR) 28 MG CP24 24 hr capsule Take 1 capsule by mouth once daily for memory. 07/21/18   Pleas Koch, NP  Multiple Vitamin (MULTI-VITAMINS) TABS Take 1 tablet by mouth daily. 01/11/18   [provider]  Omega-3 Fatty Acids (SEA-OMEGA 30) 1200 MG CAPS Take 1 capsule by mouth daily. 01/11/18   [provider]  oxybutynin (DITROPAN-XL) 5 MG 24 hr tablet Take 1 tablet (5 mg total) by mouth at bedtime. For urinary incontinence. 10/19/18   Pleas Koch, NP  sertraline (ZOLOFT) 50 MG tablet TAKE 1 TABLET BY MOUTH EVERY DAY 08/24/18   Pleas Koch, NP  STOOL SOFTENER 100 MG capsule TAKE TWO  2  CAPSULES BY MOUTH DAILY 01/11/18   [provider]   Allergies  Allergen Reactions   Penicillins Rash    Did it involve swelling of the face/tongue/throat, SOB, or low BP? Yes Did it involve sudden or severe rash/hives, skin peeling, or any reaction on the inside of your mouth or nose? Unk Did you need to seek medical attention at a hospital or doctor's office? Unk When did it last happen? "I was young" If all above answers are "NO", may proceed with cephalosporin use.     FAMILY HISTORY:  +  HTN SOCIAL HISTORY:  reports that she has never smoked. She has never used smokeless tobacco. She reports that she does not drink alcohol and does not use drugs.   BP 128/72 (BP Location: Left Arm, Cuff Size: Normal)   Pulse 75   Temp 98.2 F (36.8 C) (Temporal)   Ht 5' 7"$  (1.702 m)   Wt 175 lb (79.4 kg)   SpO2 95%   BMI 27.41 kg/m     Review of Systems: Gen:  Denies  fever, sweats, chills weight loss  HEENT: Denies blurred vision, double vision,  ear pain, eye pain, hearing loss, nose bleeds, sore throat Cardiac:  No dizziness, chest pain or heaviness, chest tightness,edema, No JVD Resp:   No cough, -sputum production, -shortness of breath,-wheezing, -hemoptysis,  Other:  All other systems negative    Physical Examination:   General Appearance: No distress  EYES PERRLA, EOM intact.   NECK Supple, No JVD Pulmonary: normal breath sounds, No wheezing.  CardiovascularNormal S1,S2.  No m/r/g.   Abdomen: Benign, Soft, non-tender. ALL OTHER ROS ARE NEGATIV  CPAP 10 cm of water pressure intestine    ASSESSMENT / PLAN:  Severe sleep apnea Continue CPAP as prescribed Patient has excellent compliance report AHI down to 3 She uses and benefits from CPAP therapy Patient has no evidence of CHF   MEDICATION ADJUSTMENTS/LABS AND TESTS ORDERED: Decrease Auto CPAP 5-10 cm h20 Continue CPAP as prescribed with new settings Avoid secondhand smoke Avoid SICK contacts Recommend  Masking  when appropriate Recommend Keep up-to-date with vaccinations  CURRENT MEDICATIONS REVIEWED AT [place]   Patient satisfied with Plan of action and management. All questions answered  Follow up in 1 year   Total Time Spent 15 mins  Maretta Bees Patricia Pesa, M.D.  [facility]  Medical Director [place] Director [facility] Cardio-Pulmonary Department

## 2022-11-25 NOTE — Patient Instructions (Addendum)
  EXCELLENT JOB! A+  Keep up the great work  Decrease Auto CPAP 5-10 cm h20  Continue CPAP as prescribed with new settings   Avoid secondhand smoke Avoid SICK contacts Recommend  Masking  when appropriate Recommend Keep up-to-date with vaccinations

## 2022-11-29 ENCOUNTER — Other Ambulatory Visit: Payer: Self-pay | Admitting: Primary Care

## 2022-11-29 DIAGNOSIS — F418 Other specified anxiety disorders: Secondary | ICD-10-CM

## 2022-11-29 DIAGNOSIS — F03918 Unspecified dementia, unspecified severity, with other behavioral disturbance: Secondary | ICD-10-CM

## 2022-11-29 NOTE — Telephone Encounter (Signed)
Please call patient's daughter:  Refill request received for hydroxyzine pills. Do they actually need refills? I see that the neurologist adjusted her meds earlier this month.

## 2022-11-30 DIAGNOSIS — F03918 Unspecified dementia, unspecified severity, with other behavioral disturbance: Secondary | ICD-10-CM | POA: Diagnosis not present

## 2022-11-30 DIAGNOSIS — R531 Weakness: Secondary | ICD-10-CM | POA: Diagnosis not present

## 2022-11-30 DIAGNOSIS — R296 Repeated falls: Secondary | ICD-10-CM | POA: Diagnosis not present

## 2022-11-30 DIAGNOSIS — F0394 Unspecified dementia, unspecified severity, with anxiety: Secondary | ICD-10-CM | POA: Diagnosis not present

## 2022-11-30 DIAGNOSIS — I2510 Atherosclerotic heart disease of native coronary artery without angina pectoris: Secondary | ICD-10-CM | POA: Diagnosis not present

## 2022-11-30 DIAGNOSIS — I10 Essential (primary) hypertension: Secondary | ICD-10-CM | POA: Diagnosis not present

## 2022-11-30 NOTE — Telephone Encounter (Signed)
Unable to reach patient. Left voicemail to return call to our office.   

## 2022-12-01 NOTE — Telephone Encounter (Signed)
Called and spoke to patient daughter she states the patient does take this 2 tab daily consistently for anxiety. She is not out yet, but daughter would like the refill sent in to have when she runs out.

## 2022-12-08 ENCOUNTER — Ambulatory Visit (INDEPENDENT_AMBULATORY_CARE_PROVIDER_SITE_OTHER): Payer: Medicare Other | Admitting: Primary Care

## 2022-12-08 ENCOUNTER — Ambulatory Visit (INDEPENDENT_AMBULATORY_CARE_PROVIDER_SITE_OTHER)
Admission: RE | Admit: 2022-12-08 | Discharge: 2022-12-08 | Disposition: A | Discharge status: Home / Self Care | Payer: Medicare Other | Source: Ambulatory Visit | Attending: Primary Care | Admitting: Primary Care

## 2022-12-08 ENCOUNTER — Encounter: Payer: Self-pay | Admitting: Primary Care

## 2022-12-08 VITALS — BP 104/58 | HR 85 | Temp 98.1°F | Ht 67.0 in | Wt 173.0 lb

## 2022-12-08 DIAGNOSIS — R296 Repeated falls: Secondary | ICD-10-CM | POA: Diagnosis not present

## 2022-12-08 DIAGNOSIS — Z8616 Personal history of COVID-19: Secondary | ICD-10-CM | POA: Diagnosis not present

## 2022-12-08 DIAGNOSIS — R531 Weakness: Secondary | ICD-10-CM | POA: Diagnosis not present

## 2022-12-08 DIAGNOSIS — E7800 Pure hypercholesterolemia, unspecified: Secondary | ICD-10-CM | POA: Diagnosis not present

## 2022-12-08 DIAGNOSIS — L89152 Pressure ulcer of sacral region, stage 2: Secondary | ICD-10-CM | POA: Diagnosis not present

## 2022-12-08 DIAGNOSIS — K5909 Other constipation: Secondary | ICD-10-CM | POA: Insufficient documentation

## 2022-12-08 DIAGNOSIS — F03918 Unspecified dementia, unspecified severity, with other behavioral disturbance: Secondary | ICD-10-CM | POA: Diagnosis not present

## 2022-12-08 DIAGNOSIS — F0394 Unspecified dementia, unspecified severity, with anxiety: Secondary | ICD-10-CM | POA: Diagnosis not present

## 2022-12-08 DIAGNOSIS — K5900 Constipation, unspecified: Secondary | ICD-10-CM | POA: Diagnosis not present

## 2022-12-08 DIAGNOSIS — K5792 Diverticulitis of intestine, part unspecified, without perforation or abscess without bleeding: Secondary | ICD-10-CM | POA: Diagnosis not present

## 2022-12-08 DIAGNOSIS — G4730 Sleep apnea, unspecified: Secondary | ICD-10-CM | POA: Diagnosis not present

## 2022-12-08 DIAGNOSIS — I10 Essential (primary) hypertension: Secondary | ICD-10-CM | POA: Diagnosis not present

## 2022-12-08 DIAGNOSIS — N3281 Overactive bladder: Secondary | ICD-10-CM | POA: Diagnosis not present

## 2022-12-08 DIAGNOSIS — H409 Unspecified glaucoma: Secondary | ICD-10-CM | POA: Diagnosis not present

## 2022-12-08 DIAGNOSIS — I2510 Atherosclerotic heart disease of native coronary artery without angina pectoris: Secondary | ICD-10-CM | POA: Diagnosis not present

## 2022-12-08 DIAGNOSIS — R7303 Prediabetes: Secondary | ICD-10-CM | POA: Diagnosis not present

## 2022-12-08 DIAGNOSIS — Z853 Personal history of malignant neoplasm of breast: Secondary | ICD-10-CM | POA: Diagnosis not present

## 2022-12-08 NOTE — Assessment & Plan Note (Addendum)
Deteriorating.   Discussed with patient and her daughter about increasing her water intake and fiber. Recommended increasing Miralax to daily and continue Colace 100 mg two tablets twice daily. Decrease Ferrous sulfate to 2-3 times a week.   They will update within 1-2 weeks . Abdominal x-ray pending.  Await results.  I evaluated patient, was consulted regarding treatment, and agree with assessment and plan per Tinnie Gens, RN, DNP student.   Allie Bossier, NP-C

## 2022-12-08 NOTE — Progress Notes (Signed)
Subjective:    Patient ID: Caroline French, female    DOB: 03-Sep-1935, 88 y.o.   MRN: [id number]  Constipation Pertinent negatives include no abdominal pain, fever, nausea or vomiting.    Caroline French is a very pleasant 88 y.o. female with a history dementia with behavorial disturbance, overactive bladder, hypertension, prediabetes who presents today to discuss skin wound and constipation.  Her daughter joins [place] who is providing information for HPI.  1) Skin Sore: Acute for the last 1 month and is located to the upper coccyx. Her wound is pink and is about "dime sized". She is incontinent and wears depends daily. The home health aids have mentioned bleeding but her daughter has not seen this. They've applied A&D ointment without relief. She had a prior sore just distal to the current sore, this healed with A&D ointment.   2) Constipation: Chronic. Bowel movements occurring once every 5 days on average. She leads a mostly sedentary life.   She's taken Miralax every other day and up to BID, Colace 200 mg BID, without improvement. About 1 month ago her daughter had to give her an Enema as she had no bowel movement for 1 week. The Enema did work well but only temporarily. She's had to use the Enema several times since then with temporary improvement.   The patient drinks one 12 oz water daily. Mostly leads a sedentary life, does walk with the home health aids. She does notice rectal discomfort without pain. She denies abdominal pain, nausea, vomiting, increased gas.   Last bowel movement was five days ago. She is managed on ferrous sulfate daily.    Review of Systems  Constitutional:  Negative for fever.  Gastrointestinal:  Positive for constipation. Negative for abdominal pain, nausea and vomiting.  Skin:  Positive for wound.         Past Medical History:  Diagnosis Date   Breast cancer [facility]) [id number]   left breast   Dementia with behavioral disturbance ([place])    Diverticulitis     Essential hypertension    Glaucoma    Hallucinations    Overactive bladder     Social History   Socioeconomic History   Marital status: Widowed    Spouse name: Not on file   Number of children: Not on file   Years of education: Not on file   Highest education level: Not on file  Occupational History   Not on file  Tobacco Use   Smoking status: Never   Smokeless tobacco: Never  Vaping Use   Vaping Use: Never used  Substance and Sexual Activity   Alcohol use: Never   Drug use: Never   Sexual activity: Not Currently  Other Topics Concern   Not on file  Social History Narrative   Widow.   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   Moved from [place].   Social Determinants of Health   Financial Resource Strain: Low Risk  (03/30/2022)   Overall Financial Resource Strain (CARDIA)    Difficulty of Paying Living Expenses: Not hard at all  Food Insecurity: No Food Insecurity (03/30/2022)   Hunger Vital Sign    Worried About Running Out of Food in the Last Year: Never true    Ran Out of Food in the Last Year: Never true  Transportation Needs: No Transportation Needs (03/30/2022)   PRAPARE - Hydrologist (Medical): No    Lack of Transportation (Non-Medical): No  Physical Activity: Insufficiently Active (03/30/2022)   Exercise  Vital Sign    Days of Exercise per Week: 1 day    Minutes of Exercise per Session: 60 min  Stress: No Stress Concern Present (03/30/2022)   [place]    Feeling of Stress : Not at all  Social Connections: [place] (03/30/2022)   Social Connection and Isolation Panel [NHANES]    Frequency of Communication with Friends and Family: More than three times a week    Frequency of Social Gatherings with Friends and Family: More than three times a week    Attends Religious Services: More than 4 times per year    Active Member of [employer] or Organizations: Yes    Attends Arts development officer: More than 4 times per year    Marital Status: Married  Human resources officer Violence: Not At Risk (03/30/2022)   Humiliation, Afraid, Rape, and Kick questionnaire    Fear of Current or Ex-Partner: No    Emotionally Abused: No    Physically Abused: No    Sexually Abused: No    Past Surgical History:  Procedure Laterality Date   ABDOMINAL HYSTERECTOMY     BREAST BIOPSY     BREAST LUMPECTOMY Left 1990's   CATARACT EXTRACTION, BILATERAL  08/012020    History reviewed. No pertinent family history.  Allergies  Allergen Reactions   Penicillins Rash    Did it involve swelling of the face/tongue/throat, SOB, or low BP? Yes Did it involve sudden or severe rash/hives, skin peeling, or any reaction on the inside of your mouth or nose? Unk Did you need to seek medical attention at a hospital or doctor's office? Unk When did it last happen? "I was young" If all above answers are "NO", may proceed with cephalosporin use.     Current Outpatient Medications on File Prior to Visit  Medication Sig Dispense Refill   Ascorbic Acid (VITAMIN C PO) Take 1 tablet by mouth daily.     aspirin (ASPIRIN LOW DOSE) 81 MG EC tablet TAKE 1 TABLET BY MOUTH ONCE DAILY 90 tablet 3   atorvastatin (LIPITOR) 40 MG tablet TAKE 1 TABLET BY MOUTH DAILY FOR CHOLESTEROL 90 tablet 3   Calcium Citrate-Vitamin D3 315-6.25 MG-MCG TABS Take 1 tablet by mouth daily.     docusate sodium (COLACE) 100 MG capsule Take 100 mg by mouth daily as needed for mild constipation.     donepezil (ARICEPT) 10 MG tablet Take 1 tablet (10 mg total) by mouth at bedtime. For memory. 90 tablet 3   ferrous sulfate 325 (65 FE) MG tablet Take 325 mg by mouth daily.     fluticasone (FLONASE) 50 MCG/ACT nasal spray INSTILL 1 SPRAY IN EACH NOSTRIL TWICE DAILY AS NEEDED FOR ALLERGIES (Patient taking differently: Place 1 spray into both nostrils 2 (two) times daily as needed for allergies.) 48 g 1   hydrOXYzine (ATARAX) 10 MG  tablet TAKE 1-2 TABLETS (10-20 MG TOTAL) BY MOUTH DAILY AS NEEDED FOR ANXIETY. 180 tablet 0   latanoprost (XALATAN) 0.005 % ophthalmic solution Place 1 drop into both eyes at bedtime.     lisinopril (ZESTRIL) 5 MG tablet Take 1 tablet (5 mg total) by mouth daily. for blood pressure. 90 tablet 3   loratadine (CLARITIN) 10 MG tablet Take 10 mg by mouth daily.     memantine (NAMENDA XR) 28 MG CP24 24 hr capsule Take by mouth.     Multiple Vitamin (MULTI-VITAMINS) TABS Take 1 tablet by mouth daily.  [place]  omeprazole (PRILOSEC) 20 MG capsule Take 1 capsule (20 mg total) by mouth daily. For heartburn 90 capsule 3   polyethylene glycol powder (GLYCOLAX/MIRALAX) 17 GM/SCOOP powder Take 17 g by mouth daily as needed for mild constipation. Pt is  taking at every day     risperiDONE (RISPERDAL) 1 MG tablet Take 2 mg by mouth at bedtime.     sertraline (ZOLOFT) 25 MG tablet Take 1 tablet (25 mg total) by mouth daily. Take with '50mg'$  dose. 90 tablet 3   sertraline (ZOLOFT) 50 MG tablet Take 1 tablet (50 mg total) by mouth daily. For anxiety. Take with 25 mg dose. 90 tablet 3   TOVIAZ 8 MG TB24 tablet Take 1 tablet (8 mg total) by mouth daily. For overactive bladder 90 tablet 3   Omega-3 Fatty Acids (SEA-OMEGA 30) 1200 MG CAPS Take 1 capsule by mouth daily. (Patient not taking: Reported on 12/08/2022)  11   No current facility-administered medications on file prior to visit.    BP (!) 104/58   Pulse 85   Temp 98.1 F (36.7 C) (Temporal)   Ht '5\' 7"'$  (1.702 m)   Wt 173 lb (78.5 kg)   SpO2 97%   BMI 27.10 kg/m  Objective:   Physical Exam Cardiovascular:     Rate and Rhythm: Normal rate.  Pulmonary:     Effort: Pulmonary effort is normal.  Abdominal:     General: Bowel sounds are normal. There is no distension.     Palpations: Abdomen is soft.     Tenderness: There is no abdominal tenderness.  Skin:    General: Skin is warm and dry.     Comments: 1.5 cm x 1 cm open stage 2 pressure ulcer to right  upper gluteal cleft. Several 1-2 mm areas of mild skin breakdown.  No increased warmth.   Neurological:     Mental Status: She is alert.           Assessment & Plan:  Chronic constipation Assessment & Plan: Deteriorating.   Discussed with patient and her daughter about increasing her water intake and fiber. Recommended increasing Miralax to daily and continue Colace 100 mg two tablets twice daily. Decrease Ferrous sulfate to 2-3 times a week.   They will update within 1-2 weeks . Abdominal x-ray pending.  Await results.  I evaluated patient, was consulted regarding treatment, and agree with assessment and plan per Tinnie Gens, RN, DNP student.   Allie Bossier, NP-C   Orders: -     DG Abd 2 Views  Pressure injury of sacral region, stage 2 [facility]) Assessment & Plan: Stage two pressure injury on upper right gluteal cleft noted on exam. No signs of infection/cellulitis  Likely secondary from urinary incontinence and sedentary lifestyle.   Home health orders placed for wound care nurse.  I evaluated patient, was consulted regarding treatment, and agree with assessment and plan per Tinnie Gens, RN, DNP student.   Allie Bossier, NP-C   Orders: -     Ambulatory referral to [place], NP

## 2022-12-08 NOTE — Progress Notes (Signed)
Established Patient Office Visit  Subjective   Patient ID: Caroline French, female    DOB: Sep 14, 1935  Age: 88 y.o. MRN: [id number]  Chief Complaint  Patient presents with   Sore    Sore above her tailbone that is open. The past couple says the home health aids have reported seeing blood when applying ointment    Constipation    Has bowel movements every 5 days Ongoing for a while and they are concerned. Enema seems to help.     Constipation Pertinent negatives include no abdominal pain, diarrhea, fever, nausea or vomiting.    Caroline French is a 88 year old female with past medical history of Dementia, hypertension, GERD, HLD, Prediabetes presents today for an acute visit.   Her daughter is here with her today to provide history.   1- Sore: The home health aids have reported that she has a sore above her tailbone which has been bleeding at times while applying ointment. Her daughter reports the sore has been there for about a month and it is open. She is incontinent of urine and wears depends and a pad. She is able to tell her daughter prior to having a stool. Her describes it as pink and says its about a "dime" sized. She did have one under it but it has healed. They have been applying the A&D ointment and there is no relief. They used the same ointment for the one prior. Her daughter is unsure if there is any pus but denies any discomfort, itchy or pain. She denies any rectal bleeding.   2- Constipation: She has a history of chronic constipation. Her daughter sent a mychart message on 10/22/22 regarding the patient having constipation and had not had a BM for one week. She was taking her miralax twice daily. She denied any rectal pain or pressure. It was recommended for her to give her an enema and to take hydroxyzine to help with the anxiety prior to the enema. Her daughter reports that the patient is now having a BM every 5 days. Enema helps with the BM on the day of the enema and for two days  after and then she will go upto five days with no BM until enema is done. Her daughter reports that she drinks one 12.5 oz  bottle of water. She does try to active with occupational therapy once a week and walks twice daily with the aids except once on the weekends. She does have some rectal pain but no pressure. She does not have to strain. She denies any nausea, vomiting, gas, or abdominal pain. Her last BM was on Friday and last enema was two weeks ago.    Patient Active Problem List   Diagnosis Date Noted   Chronic constipation 12/08/2022   Pressure injury of sacral region, stage 2 ([place]) 12/08/2022   Generalized weakness 09/16/2022   Meningioma ([place]) 09/16/2022   Cerumen impaction 04/23/2022   Sepsis ([place]) 12/23/2021   Dementia ([place]) 12/23/2021   AKI (acute kidney injury) ([place]) 12/23/2021   CAP (community acquired pneumonia) 12/22/2021   Urgency of urination 09/23/2020   Gastroesophageal reflux disease 01/29/2020   Altered mental status 09/11/2019   Sleep apnea 10/19/2018   Prediabetes 07/21/2018   Hyperlipidemia 01/13/2018   Dementia with behavioral disturbance ([employer]) 01/13/2018   Overactive bladder 01/13/2018   Glaucoma 01/13/2018   Essential hypertension 01/13/2018   Past Medical History:  Diagnosis Date   Breast cancer ([place]) 1992's   left breast  Dementia with behavioral disturbance ([facility])    Diverticulitis    Essential hypertension    Glaucoma    Hallucinations    Overactive bladder    Past Surgical History:  Procedure Laterality Date   ABDOMINAL HYSTERECTOMY     BREAST BIOPSY     BREAST LUMPECTOMY Left 1990's   CATARACT EXTRACTION, BILATERAL  08/012020   Social History   Tobacco Use   Smoking status: Never   Smokeless tobacco: Never  Vaping Use   Vaping Use: Never used  Substance Use Topics   Alcohol use: Never   Drug use: Never   Social History   Socioeconomic History   Marital status: Widowed    Spouse name: Not on file   Number of children: Not  on file   Years of education: Not on file   Highest education level: Not on file  Occupational History   Not on file  Tobacco Use   Smoking status: Never   Smokeless tobacco: Never  Vaping Use   Vaping Use: Never used  Substance and Sexual Activity   Alcohol use: Never   Drug use: Never   Sexual activity: Not Currently  Other Topics Concern   Not on file  Social History Narrative   Widow.   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   Moved from [place].   Social Determinants of Health   Financial Resource Strain: Low Risk  (03/30/2022)   Overall Financial Resource Strain (CARDIA)    Difficulty of Paying Living Expenses: Not hard at all  Food Insecurity: No Food Insecurity (03/30/2022)   Hunger Vital Sign    Worried About Running Out of Food in the Last Year: Never true    Ran Out of Food in the Last Year: Never true  Transportation Needs: No Transportation Needs (03/30/2022)   PRAPARE - Hydrologist (Medical): No    Lack of Transportation (Non-Medical): No  Physical Activity: Insufficiently Active (03/30/2022)   Exercise Vital Sign    Days of Exercise per Week: 1 day    Minutes of Exercise per Session: 60 min  Stress: No Stress Concern Present (03/30/2022)   [place]    Feeling of Stress : Not at all  Social Connections: [place] (03/30/2022)   Social Connection and Isolation Panel [NHANES]    Frequency of Communication with Friends and Family: More than three times a week    Frequency of Social Gatherings with Friends and Family: More than three times a week    Attends Religious Services: More than 4 times per year    Active Member of [employer] or Organizations: Yes    Attends Music therapist: More than 4 times per year    Marital Status: Married  Human resources officer Violence: Not At Risk (03/30/2022)   Humiliation, Afraid, Rape, and Kick questionnaire    Fear of Current  or Ex-Partner: No    Emotionally Abused: No    Physically Abused: No    Sexually Abused: No   Allergies  Allergen Reactions   Penicillins Rash    Did it involve swelling of the face/tongue/throat, SOB, or low BP? Yes Did it involve sudden or severe rash/hives, skin peeling, or any reaction on the inside of your mouth or nose? Unk Did you need to seek medical attention at a hospital or doctor's office? Unk When did it last happen? "I was young" If all above answers are "NO", may proceed with cephalosporin  use.       Review of Systems  Constitutional:  Negative for chills and fever.  Respiratory:  Negative for shortness of breath.   Cardiovascular:  Negative for chest pain.  Gastrointestinal:  Positive for constipation. Negative for abdominal pain, diarrhea, nausea and vomiting.  Skin:        Sore located on her right sacrum.  Neurological:  Negative for dizziness and headaches.      Objective:     BP (!) 104/58   Pulse 85   Temp 98.1 F (36.7 C) (Temporal)   Ht '5\' 7"'$  (1.702 m)   Wt 173 lb (78.5 kg)   SpO2 97%   BMI 27.10 kg/m  BP Readings from Last 3 Encounters:  12/08/22 (!) 104/58  11/25/22 128/72  09/16/22 (!) 164/72   Wt Readings from Last 3 Encounters:  12/08/22 173 lb (78.5 kg)  11/25/22 175 lb (79.4 kg)  09/16/22 181 lb (82.1 kg)      Physical Exam Vitals and nursing note reviewed. Exam conducted with a chaperone present.  Cardiovascular:     Rate and Rhythm: Normal rate and regular rhythm.     Pulses: Normal pulses.     Heart sounds: Normal heart sounds.  Pulmonary:     Effort: Pulmonary effort is normal.     Breath sounds: Normal breath sounds.  Abdominal:     General: Bowel sounds are normal.     Palpations: Abdomen is soft.  Skin:    General: Skin is warm and dry.     Findings: Wound present.     Comments: Pressure injury noted on right upper gluteal cleft  Neurological:     Mental Status: Mental status is at baseline.  Psychiatric:         Mood and Affect: Mood normal.        Behavior: Behavior normal.      No results found for any visits on 12/08/22.     The ASCVD Risk score (Arnett DK, et al., 2019) failed to calculate for the following reasons:   The 2019 ASCVD risk score is only valid for ages 66 to 73    Assessment & Plan:   Problem List Items Addressed This Visit       Digestive   Chronic constipation - Primary    Deteriorating.   Discussed with patient and her daughter about increasing her water intake and fiber. Recommended increasing Miralax to daily and continue Colace 100 mg two tablets twice daily. Decrease Ferrous sulfate to 2-3 times a week.   They will update within 1-2 weeks . Abdominal x-ray pending.  Await results.      Relevant Orders   DG Abd 2 Views     Other   Pressure injury of sacral region, stage 2 ([facility])    Stage two pressure injury on upper right gluteal cleft noted on exam. No erythema, warmth, pus on exam.   Likely secondary from urinary incontinence and sedentary lifestyle.   Home health orders placed for wound care nurse.       No follow-ups on file.    Tinnie Gens, BSN-RN, DNP STUDENT

## 2022-12-08 NOTE — Patient Instructions (Signed)
Complete xray(s) prior to leaving today. I will notify you of your results once received.  Increase Miralax to daily. Decrease ferrous sulfate to 2-3 times a week. Continue Colace 100 mg twice daily.  Please update me in one to two weeks.   Home health orders have been placed for the wound care nurse.   It was a pleasure to see you today!

## 2022-12-08 NOTE — Assessment & Plan Note (Addendum)
Stage two pressure injury on upper right gluteal cleft noted on exam. No signs of infection/cellulitis  Likely secondary from urinary incontinence and sedentary lifestyle.   Home health orders placed for wound care nurse.  I evaluated patient, was consulted regarding treatment, and agree with assessment and plan per Tinnie Gens, RN, DNP student.   Allie Bossier, NP-C

## 2022-12-09 DIAGNOSIS — F0394 Unspecified dementia, unspecified severity, with anxiety: Secondary | ICD-10-CM | POA: Diagnosis not present

## 2022-12-09 DIAGNOSIS — R531 Weakness: Secondary | ICD-10-CM | POA: Diagnosis not present

## 2022-12-09 DIAGNOSIS — R296 Repeated falls: Secondary | ICD-10-CM | POA: Diagnosis not present

## 2022-12-09 DIAGNOSIS — I10 Essential (primary) hypertension: Secondary | ICD-10-CM | POA: Diagnosis not present

## 2022-12-09 DIAGNOSIS — I2510 Atherosclerotic heart disease of native coronary artery without angina pectoris: Secondary | ICD-10-CM | POA: Diagnosis not present

## 2022-12-09 DIAGNOSIS — F03918 Unspecified dementia, unspecified severity, with other behavioral disturbance: Secondary | ICD-10-CM | POA: Diagnosis not present

## 2022-12-13 ENCOUNTER — Encounter: Payer: Self-pay | Admitting: Primary Care

## 2022-12-13 ENCOUNTER — Telehealth: Payer: Self-pay

## 2022-12-13 ENCOUNTER — Ambulatory Visit (INDEPENDENT_AMBULATORY_CARE_PROVIDER_SITE_OTHER): Payer: Medicare Other | Admitting: Primary Care

## 2022-12-13 VITALS — BP 124/72 | HR 82 | Temp 98.2°F | Ht 67.0 in | Wt 176.0 lb

## 2022-12-13 DIAGNOSIS — K5909 Other constipation: Secondary | ICD-10-CM | POA: Diagnosis not present

## 2022-12-13 NOTE — Telephone Encounter (Signed)
Done

## 2022-12-13 NOTE — Patient Instructions (Signed)
Try the enema as discussed.  Continue with Miralax and Colace.   Continue to drink plenty of water.  It was a pleasure to see you today!

## 2022-12-13 NOTE — Assessment & Plan Note (Addendum)
With mild to moderate fecal impaction. Patient's daughter consented to removal. Removed most of impaction today. No alarm signs on exam   Daughter will continue with Miralax and Colace. Try Enema today as there was residual stool.  Follow up PRN.

## 2022-12-13 NOTE — Progress Notes (Signed)
Subjective:    Patient ID: Caroline French, female    DOB: [birth year], 88 y.o.   MRN: [id number]  HPI  Caroline French is a very pleasant 88 y.o. female with a history of dementia with behavioral disturbance, chronic constipation, hypertension, glaucoma, prediabetes, who presents today for constipation disimpaction.  Her daughter is here today who helps provide information for HPI.    She was last evaluated last week for increased constipation despite numerous over-the-counter products.  She had temporary improvement with enema but symptoms returned quickly.  During her last visit she underwent abdominal plain films which revealed a large ball of stool at the rectum.  She is here today for disimpaction.  Since her last visit she's had 2 bowel movements. Her last movement was this morning which was smaller than usual. She is working to consume walk daily.  Her daughter continues to provide her with MiraLAX and Colace.   Review of Systems  Gastrointestinal:  Positive for constipation. Negative for abdominal pain and diarrhea.         Past Medical History:  Diagnosis Date   Breast cancer [facility]) [id number]   left breast   Dementia with behavioral disturbance ([place])    Diverticulitis    Essential hypertension    Glaucoma    Hallucinations    Overactive bladder     Social History   Socioeconomic History   Marital status: Widowed    Spouse name: Not on file   Number of children: Not on file   Years of education: Not on file   Highest education level: Not on file  Occupational History   Not on file  Tobacco Use   Smoking status: Never   Smokeless tobacco: Never  Vaping Use   Vaping Use: Never used  Substance and Sexual Activity   Alcohol use: Never   Drug use: Never   Sexual activity: Not Currently  Other Topics Concern   Not on file  Social History Narrative   Widow.   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   Moved from [place].   Social Determinants of Health   Financial Resource  Strain: Low Risk  (03/30/2022)   Overall Financial Resource Strain (CARDIA)    Difficulty of Paying Living Expenses: Not hard at all  Food Insecurity: No Food Insecurity (03/30/2022)   Hunger Vital Sign    Worried About Running Out of Food in the Last Year: Never true    Ran Out of Food in the Last Year: Never true  Transportation Needs: No Transportation Needs (03/30/2022)   PRAPARE - Hydrologist (Medical): No    Lack of Transportation (Non-Medical): No  Physical Activity: Insufficiently Active (03/30/2022)   Exercise Vital Sign    Days of Exercise per Week: 1 day    Minutes of Exercise per Session: 60 min  Stress: No Stress Concern Present (03/30/2022)   Corry    Feeling of Stress : Not at all  Social Connections: [place] (03/30/2022)   Social Connection and Isolation Panel [NHANES]    Frequency of Communication with Friends and Family: More than three times a week    Frequency of Social Gatherings with Friends and Family: More than three times a week    Attends Religious Services: More than 4 times per year    Active Member of [employer] or Organizations: Yes    Attends Archivist Meetings: More than 4 times per year  Marital Status: Married  Human resources officer Violence: Not At Risk (03/30/2022)   Humiliation, Afraid, Rape, and Kick questionnaire    Fear of Current or Ex-Partner: No    Emotionally Abused: No    Physically Abused: No    Sexually Abused: No    Past Surgical History:  Procedure Laterality Date   ABDOMINAL HYSTERECTOMY     BREAST BIOPSY     BREAST LUMPECTOMY Left 1990's   CATARACT EXTRACTION, BILATERAL  08/012020    History reviewed. No pertinent family history.  Allergies  Allergen Reactions   Penicillins Rash    Did it involve swelling of the face/tongue/throat, SOB, or low BP? Yes Did it involve sudden or severe rash/hives, skin peeling, or  any reaction on the inside of your mouth or nose? Unk Did you need to seek medical attention at a hospital or doctor's office? Unk When did it last happen? "I was young" If all above answers are "NO", may proceed with cephalosporin use.     Current Outpatient Medications on File Prior to Visit  Medication Sig Dispense Refill   aspirin (ASPIRIN LOW DOSE) 81 MG EC tablet TAKE 1 TABLET BY MOUTH ONCE DAILY 90 tablet 3   atorvastatin (LIPITOR) 40 MG tablet TAKE 1 TABLET BY MOUTH DAILY FOR CHOLESTEROL 90 tablet 3   Calcium Citrate-Vitamin D3 315-6.25 MG-MCG TABS Take 1 tablet by mouth daily.     docusate sodium (COLACE) 100 MG capsule Take 100 mg by mouth daily as needed for mild constipation.     donepezil (ARICEPT) 10 MG tablet Take 1 tablet (10 mg total) by mouth at bedtime. For memory. 90 tablet 3   fluticasone (FLONASE) 50 MCG/ACT nasal spray INSTILL 1 SPRAY IN EACH NOSTRIL TWICE DAILY AS NEEDED FOR ALLERGIES (Patient taking differently: Place 1 spray into both nostrils 2 (two) times daily as needed for allergies.) 48 g 1   hydrOXYzine (ATARAX) 10 MG tablet TAKE 1-2 TABLETS (10-20 MG TOTAL) BY MOUTH DAILY AS NEEDED FOR ANXIETY. 180 tablet 0   latanoprost (XALATAN) 0.005 % ophthalmic solution Place 1 drop into both eyes at bedtime.     lisinopril (ZESTRIL) 5 MG tablet Take 1 tablet (5 mg total) by mouth daily. for blood pressure. 90 tablet 3   loratadine (CLARITIN) 10 MG tablet Take 10 mg by mouth daily.     memantine (NAMENDA XR) 28 MG CP24 24 hr capsule Take by mouth.     Multiple Vitamin (MULTI-VITAMINS) TABS Take 1 tablet by mouth daily.  11   omeprazole (PRILOSEC) 20 MG capsule Take 1 capsule (20 mg total) by mouth daily. For heartburn 90 capsule 3   polyethylene glycol powder (GLYCOLAX/MIRALAX) 17 GM/SCOOP powder Take 17 g by mouth daily as needed for mild constipation. Pt is  taking at every day     risperiDONE (RISPERDAL) 1 MG tablet Take 2 mg by mouth at bedtime.     sertraline  (ZOLOFT) 25 MG tablet Take 1 tablet (25 mg total) by mouth daily. Take with '50mg'$  dose. 90 tablet 3   sertraline (ZOLOFT) 50 MG tablet Take 1 tablet (50 mg total) by mouth daily. For anxiety. Take with 25 mg dose. 90 tablet 3   TOVIAZ 8 MG TB24 tablet Take 1 tablet (8 mg total) by mouth daily. For overactive bladder 90 tablet 3   Ascorbic Acid (VITAMIN C PO) Take 1 tablet by mouth daily. (Patient not taking: Reported on 12/13/2022)     ferrous sulfate 325 (65 FE) MG tablet Take  325 mg by mouth daily. (Patient not taking: Reported on 12/13/2022)     Omega-3 Fatty Acids (SEA-OMEGA 30) 1200 MG CAPS Take 1 capsule by mouth daily. (Patient not taking: Reported on 12/13/2022)  11   No current facility-administered medications on file prior to visit.    BP 124/72   Pulse 82   Temp 98.2 F (36.8 C) (Temporal)   Ht '5\' 7"'$  (1.702 m)   Wt 176 lb (79.8 kg)   SpO2 98%   BMI 27.57 kg/m  Objective:   Physical Exam Exam conducted with a chaperone present.  Genitourinary:    Rectum: No mass or external hemorrhoid. Normal anal tone.     Comments: Moderate stool to the inside of the rectum. Soft. Light brown.  Skin:    General: Skin is warm and dry.  Neurological:     Mental Status: She is alert.           Assessment & Plan:  Chronic constipation Assessment & Plan: With mild to moderate fecal impaction. Patient's daughter consented to removal. Removed most of impaction today. No alarm signs on exam   Daughter will continue with Miralax and Colace. Try Enema today as there was residual stool.  Follow up PRN.         Pleas Koch, NP

## 2022-12-13 NOTE — Telephone Encounter (Signed)
Spoke with [place] team she apologizes that this order never got taken care of.   She is requesting that you copy the narrative written in the DME order for the Wheelchair placed on 10/26/22 and paste that into the progress note from 09/16/22

## 2022-12-14 DIAGNOSIS — R296 Repeated falls: Secondary | ICD-10-CM | POA: Diagnosis not present

## 2022-12-14 DIAGNOSIS — R531 Weakness: Secondary | ICD-10-CM | POA: Diagnosis not present

## 2022-12-14 DIAGNOSIS — F0394 Unspecified dementia, unspecified severity, with anxiety: Secondary | ICD-10-CM | POA: Diagnosis not present

## 2022-12-14 DIAGNOSIS — I2510 Atherosclerotic heart disease of native coronary artery without angina pectoris: Secondary | ICD-10-CM | POA: Diagnosis not present

## 2022-12-14 DIAGNOSIS — I10 Essential (primary) hypertension: Secondary | ICD-10-CM | POA: Diagnosis not present

## 2022-12-14 DIAGNOSIS — F03918 Unspecified dementia, unspecified severity, with other behavioral disturbance: Secondary | ICD-10-CM | POA: Diagnosis not present

## 2022-12-14 NOTE — Telephone Encounter (Signed)
Advised [facility] team of the addendum created in progress note.

## 2022-12-16 ENCOUNTER — Telehealth: Payer: Self-pay | Admitting: Primary Care

## 2022-12-16 DIAGNOSIS — Z853 Personal history of malignant neoplasm of breast: Secondary | ICD-10-CM | POA: Diagnosis not present

## 2022-12-16 DIAGNOSIS — L89152 Pressure ulcer of sacral region, stage 2: Secondary | ICD-10-CM | POA: Diagnosis not present

## 2022-12-16 DIAGNOSIS — K5730 Diverticulosis of large intestine without perforation or abscess without bleeding: Secondary | ICD-10-CM | POA: Diagnosis not present

## 2022-12-16 DIAGNOSIS — F419 Anxiety disorder, unspecified: Secondary | ICD-10-CM | POA: Diagnosis not present

## 2022-12-16 DIAGNOSIS — K219 Gastro-esophageal reflux disease without esophagitis: Secondary | ICD-10-CM | POA: Diagnosis not present

## 2022-12-16 DIAGNOSIS — N179 Acute kidney failure, unspecified: Secondary | ICD-10-CM | POA: Diagnosis not present

## 2022-12-16 DIAGNOSIS — K5900 Constipation, unspecified: Secondary | ICD-10-CM | POA: Diagnosis not present

## 2022-12-16 DIAGNOSIS — R3915 Urgency of urination: Secondary | ICD-10-CM | POA: Diagnosis not present

## 2022-12-16 DIAGNOSIS — G4733 Obstructive sleep apnea (adult) (pediatric): Secondary | ICD-10-CM | POA: Diagnosis not present

## 2022-12-16 DIAGNOSIS — N3281 Overactive bladder: Secondary | ICD-10-CM | POA: Diagnosis not present

## 2022-12-16 DIAGNOSIS — R7303 Prediabetes: Secondary | ICD-10-CM | POA: Diagnosis not present

## 2022-12-16 DIAGNOSIS — H409 Unspecified glaucoma: Secondary | ICD-10-CM | POA: Diagnosis not present

## 2022-12-16 DIAGNOSIS — Z556 Problems related to health literacy: Secondary | ICD-10-CM | POA: Diagnosis not present

## 2022-12-16 DIAGNOSIS — F0282 Dementia in other diseases classified elsewhere, unspecified severity, with psychotic disturbance: Secondary | ICD-10-CM | POA: Diagnosis not present

## 2022-12-16 DIAGNOSIS — E785 Hyperlipidemia, unspecified: Secondary | ICD-10-CM | POA: Diagnosis not present

## 2022-12-16 DIAGNOSIS — I10 Essential (primary) hypertension: Secondary | ICD-10-CM | POA: Diagnosis not present

## 2022-12-16 DIAGNOSIS — G309 Alzheimer's disease, unspecified: Secondary | ICD-10-CM | POA: Diagnosis not present

## 2022-12-16 DIAGNOSIS — Z9181 History of falling: Secondary | ICD-10-CM | POA: Diagnosis not present

## 2022-12-16 NOTE — Telephone Encounter (Signed)
Home Health verbal orders [place] Name: Caroline French number: [phone]  Requesting OT/PT/Skilled nursing/Social Work/Speech: nursing  Reason: 3 stage 2 coccyx sores, clean with wound cleanser,and wound dressing  Frequency: every other day  Please forward to [facility] pool or providers CMA

## 2022-12-17 NOTE — Telephone Encounter (Signed)
Called and advised Katharine Look with [facility] of the approval of the requested verbal orders for this patient. Advised to call back with any further questions.

## 2022-12-17 NOTE — Telephone Encounter (Signed)
Katharine Look called to check on status of orders?

## 2022-12-17 NOTE — Telephone Encounter (Signed)
Advised Katharine Look that Anda Kraft is out of the office, but checking messages periodically. Advised we will make sure this gets taken care of today.

## 2022-12-17 NOTE — Telephone Encounter (Signed)
It is ok to approve verbal orders

## 2022-12-24 DIAGNOSIS — F0282 Dementia in other diseases classified elsewhere, unspecified severity, with psychotic disturbance: Secondary | ICD-10-CM | POA: Diagnosis not present

## 2022-12-24 DIAGNOSIS — L89152 Pressure ulcer of sacral region, stage 2: Secondary | ICD-10-CM | POA: Diagnosis not present

## 2022-12-24 DIAGNOSIS — N179 Acute kidney failure, unspecified: Secondary | ICD-10-CM | POA: Diagnosis not present

## 2022-12-24 DIAGNOSIS — I10 Essential (primary) hypertension: Secondary | ICD-10-CM | POA: Diagnosis not present

## 2022-12-24 DIAGNOSIS — G309 Alzheimer's disease, unspecified: Secondary | ICD-10-CM | POA: Diagnosis not present

## 2022-12-24 DIAGNOSIS — G4733 Obstructive sleep apnea (adult) (pediatric): Secondary | ICD-10-CM | POA: Diagnosis not present

## 2022-12-27 DIAGNOSIS — L89152 Pressure ulcer of sacral region, stage 2: Secondary | ICD-10-CM | POA: Diagnosis not present

## 2022-12-27 DIAGNOSIS — N179 Acute kidney failure, unspecified: Secondary | ICD-10-CM | POA: Diagnosis not present

## 2022-12-27 DIAGNOSIS — G4733 Obstructive sleep apnea (adult) (pediatric): Secondary | ICD-10-CM | POA: Diagnosis not present

## 2022-12-27 DIAGNOSIS — F0282 Dementia in other diseases classified elsewhere, unspecified severity, with psychotic disturbance: Secondary | ICD-10-CM | POA: Diagnosis not present

## 2022-12-27 DIAGNOSIS — I10 Essential (primary) hypertension: Secondary | ICD-10-CM | POA: Diagnosis not present

## 2022-12-27 DIAGNOSIS — G309 Alzheimer's disease, unspecified: Secondary | ICD-10-CM | POA: Diagnosis not present

## 2022-12-30 DIAGNOSIS — I10 Essential (primary) hypertension: Secondary | ICD-10-CM | POA: Diagnosis not present

## 2022-12-30 DIAGNOSIS — G309 Alzheimer's disease, unspecified: Secondary | ICD-10-CM | POA: Diagnosis not present

## 2022-12-30 DIAGNOSIS — L89152 Pressure ulcer of sacral region, stage 2: Secondary | ICD-10-CM | POA: Diagnosis not present

## 2022-12-30 DIAGNOSIS — G4733 Obstructive sleep apnea (adult) (pediatric): Secondary | ICD-10-CM | POA: Diagnosis not present

## 2022-12-30 DIAGNOSIS — F0282 Dementia in other diseases classified elsewhere, unspecified severity, with psychotic disturbance: Secondary | ICD-10-CM | POA: Diagnosis not present

## 2022-12-30 DIAGNOSIS — N179 Acute kidney failure, unspecified: Secondary | ICD-10-CM | POA: Diagnosis not present

## 2023-01-03 DIAGNOSIS — G309 Alzheimer's disease, unspecified: Secondary | ICD-10-CM | POA: Diagnosis not present

## 2023-01-03 DIAGNOSIS — F0282 Dementia in other diseases classified elsewhere, unspecified severity, with psychotic disturbance: Secondary | ICD-10-CM | POA: Diagnosis not present

## 2023-01-03 DIAGNOSIS — I10 Essential (primary) hypertension: Secondary | ICD-10-CM | POA: Diagnosis not present

## 2023-01-03 DIAGNOSIS — G4733 Obstructive sleep apnea (adult) (pediatric): Secondary | ICD-10-CM | POA: Diagnosis not present

## 2023-01-03 DIAGNOSIS — L89152 Pressure ulcer of sacral region, stage 2: Secondary | ICD-10-CM | POA: Diagnosis not present

## 2023-01-03 DIAGNOSIS — N179 Acute kidney failure, unspecified: Secondary | ICD-10-CM | POA: Diagnosis not present

## 2023-01-11 DIAGNOSIS — N179 Acute kidney failure, unspecified: Secondary | ICD-10-CM | POA: Diagnosis not present

## 2023-01-11 DIAGNOSIS — I10 Essential (primary) hypertension: Secondary | ICD-10-CM | POA: Diagnosis not present

## 2023-01-11 DIAGNOSIS — L89152 Pressure ulcer of sacral region, stage 2: Secondary | ICD-10-CM | POA: Diagnosis not present

## 2023-01-11 DIAGNOSIS — G309 Alzheimer's disease, unspecified: Secondary | ICD-10-CM | POA: Diagnosis not present

## 2023-01-11 DIAGNOSIS — G4733 Obstructive sleep apnea (adult) (pediatric): Secondary | ICD-10-CM | POA: Diagnosis not present

## 2023-01-11 DIAGNOSIS — F0282 Dementia in other diseases classified elsewhere, unspecified severity, with psychotic disturbance: Secondary | ICD-10-CM | POA: Diagnosis not present

## 2023-01-15 DIAGNOSIS — F0282 Dementia in other diseases classified elsewhere, unspecified severity, with psychotic disturbance: Secondary | ICD-10-CM | POA: Diagnosis not present

## 2023-01-15 DIAGNOSIS — G4733 Obstructive sleep apnea (adult) (pediatric): Secondary | ICD-10-CM | POA: Diagnosis not present

## 2023-01-15 DIAGNOSIS — N179 Acute kidney failure, unspecified: Secondary | ICD-10-CM | POA: Diagnosis not present

## 2023-01-15 DIAGNOSIS — E785 Hyperlipidemia, unspecified: Secondary | ICD-10-CM | POA: Diagnosis not present

## 2023-01-15 DIAGNOSIS — Z853 Personal history of malignant neoplasm of breast: Secondary | ICD-10-CM | POA: Diagnosis not present

## 2023-01-15 DIAGNOSIS — R7303 Prediabetes: Secondary | ICD-10-CM | POA: Diagnosis not present

## 2023-01-15 DIAGNOSIS — K5900 Constipation, unspecified: Secondary | ICD-10-CM | POA: Diagnosis not present

## 2023-01-15 DIAGNOSIS — G309 Alzheimer's disease, unspecified: Secondary | ICD-10-CM | POA: Diagnosis not present

## 2023-01-15 DIAGNOSIS — Z556 Problems related to health literacy: Secondary | ICD-10-CM | POA: Diagnosis not present

## 2023-01-15 DIAGNOSIS — F419 Anxiety disorder, unspecified: Secondary | ICD-10-CM | POA: Diagnosis not present

## 2023-01-15 DIAGNOSIS — R3915 Urgency of urination: Secondary | ICD-10-CM | POA: Diagnosis not present

## 2023-01-15 DIAGNOSIS — H409 Unspecified glaucoma: Secondary | ICD-10-CM | POA: Diagnosis not present

## 2023-01-15 DIAGNOSIS — Z9181 History of falling: Secondary | ICD-10-CM | POA: Diagnosis not present

## 2023-01-15 DIAGNOSIS — I10 Essential (primary) hypertension: Secondary | ICD-10-CM | POA: Diagnosis not present

## 2023-01-15 DIAGNOSIS — K5730 Diverticulosis of large intestine without perforation or abscess without bleeding: Secondary | ICD-10-CM | POA: Diagnosis not present

## 2023-01-15 DIAGNOSIS — N3281 Overactive bladder: Secondary | ICD-10-CM | POA: Diagnosis not present

## 2023-01-15 DIAGNOSIS — K219 Gastro-esophageal reflux disease without esophagitis: Secondary | ICD-10-CM | POA: Diagnosis not present

## 2023-01-15 DIAGNOSIS — L89152 Pressure ulcer of sacral region, stage 2: Secondary | ICD-10-CM | POA: Diagnosis not present

## 2023-01-19 DIAGNOSIS — F0282 Dementia in other diseases classified elsewhere, unspecified severity, with psychotic disturbance: Secondary | ICD-10-CM | POA: Diagnosis not present

## 2023-01-19 DIAGNOSIS — I10 Essential (primary) hypertension: Secondary | ICD-10-CM | POA: Diagnosis not present

## 2023-01-19 DIAGNOSIS — L89152 Pressure ulcer of sacral region, stage 2: Secondary | ICD-10-CM | POA: Diagnosis not present

## 2023-01-19 DIAGNOSIS — G309 Alzheimer's disease, unspecified: Secondary | ICD-10-CM | POA: Diagnosis not present

## 2023-01-19 DIAGNOSIS — G4733 Obstructive sleep apnea (adult) (pediatric): Secondary | ICD-10-CM | POA: Diagnosis not present

## 2023-01-19 DIAGNOSIS — N179 Acute kidney failure, unspecified: Secondary | ICD-10-CM | POA: Diagnosis not present

## 2023-01-25 ENCOUNTER — Telehealth: Payer: Self-pay | Admitting: Primary Care

## 2023-01-25 DIAGNOSIS — G309 Alzheimer's disease, unspecified: Secondary | ICD-10-CM | POA: Diagnosis not present

## 2023-01-25 DIAGNOSIS — I10 Essential (primary) hypertension: Secondary | ICD-10-CM | POA: Diagnosis not present

## 2023-01-25 DIAGNOSIS — G4733 Obstructive sleep apnea (adult) (pediatric): Secondary | ICD-10-CM | POA: Diagnosis not present

## 2023-01-25 DIAGNOSIS — N179 Acute kidney failure, unspecified: Secondary | ICD-10-CM | POA: Diagnosis not present

## 2023-01-25 DIAGNOSIS — L89152 Pressure ulcer of sacral region, stage 2: Secondary | ICD-10-CM | POA: Diagnosis not present

## 2023-01-25 DIAGNOSIS — F0282 Dementia in other diseases classified elsewhere, unspecified severity, with psychotic disturbance: Secondary | ICD-10-CM | POA: Diagnosis not present

## 2023-01-25 NOTE — Telephone Encounter (Signed)
Called and advised Enrique Sack with [facility] of the approval of the requested verbal orders for this patient. Advised to call back with any further questions.

## 2023-01-25 NOTE — Telephone Encounter (Signed)
Home Health verbal orders Caller Name: Enrique Sack  Agency Name: [facility]   Callback number: [phone]  Requesting OT/PT/Skilled nursing/Social Work/Speech: PT   Reason: work on transfers, walking & bed mobility   Frequency: twice a week for two , once a week for one week   Please forward to [facility] pool or providers CMA

## 2023-01-25 NOTE — Telephone Encounter (Signed)
Approved.  

## 2023-01-26 ENCOUNTER — Other Ambulatory Visit: Payer: Self-pay | Admitting: Neurology

## 2023-01-26 DIAGNOSIS — M79605 Pain in left leg: Secondary | ICD-10-CM

## 2023-01-26 DIAGNOSIS — M7989 Other specified soft tissue disorders: Secondary | ICD-10-CM

## 2023-01-26 DIAGNOSIS — F514 Sleep terrors [night terrors]: Secondary | ICD-10-CM | POA: Diagnosis not present

## 2023-01-26 DIAGNOSIS — R441 Visual hallucinations: Secondary | ICD-10-CM | POA: Diagnosis not present

## 2023-01-26 DIAGNOSIS — F03A0 Unspecified dementia, mild, without behavioral disturbance, psychotic disturbance, mood disturbance, and anxiety: Secondary | ICD-10-CM | POA: Diagnosis not present

## 2023-01-26 DIAGNOSIS — I890 Lymphedema, not elsewhere classified: Secondary | ICD-10-CM | POA: Diagnosis not present

## 2023-01-26 DIAGNOSIS — Z8659 Personal history of other mental and behavioral disorders: Secondary | ICD-10-CM | POA: Diagnosis not present

## 2023-01-26 DIAGNOSIS — F411 Generalized anxiety disorder: Secondary | ICD-10-CM | POA: Diagnosis not present

## 2023-01-26 DIAGNOSIS — R413 Other amnesia: Secondary | ICD-10-CM | POA: Diagnosis not present

## 2023-01-26 DIAGNOSIS — F05 Delirium due to known physiological condition: Secondary | ICD-10-CM | POA: Diagnosis not present

## 2023-01-26 DIAGNOSIS — R296 Repeated falls: Secondary | ICD-10-CM | POA: Diagnosis not present

## 2023-01-27 ENCOUNTER — Ambulatory Visit
Admission: RE | Admit: 2023-01-27 | Discharge: 2023-01-27 | Disposition: A | Discharge status: Home / Self Care | Payer: Medicare Other | Source: Ambulatory Visit | Attending: Neurology | Admitting: Neurology

## 2023-01-27 DIAGNOSIS — G4733 Obstructive sleep apnea (adult) (pediatric): Secondary | ICD-10-CM | POA: Diagnosis not present

## 2023-01-27 DIAGNOSIS — F0282 Dementia in other diseases classified elsewhere, unspecified severity, with psychotic disturbance: Secondary | ICD-10-CM | POA: Diagnosis not present

## 2023-01-27 DIAGNOSIS — M79605 Pain in left leg: Secondary | ICD-10-CM | POA: Diagnosis not present

## 2023-01-27 DIAGNOSIS — M7989 Other specified soft tissue disorders: Secondary | ICD-10-CM | POA: Diagnosis not present

## 2023-01-27 DIAGNOSIS — I10 Essential (primary) hypertension: Secondary | ICD-10-CM | POA: Diagnosis not present

## 2023-01-27 DIAGNOSIS — G309 Alzheimer's disease, unspecified: Secondary | ICD-10-CM | POA: Diagnosis not present

## 2023-01-27 DIAGNOSIS — N179 Acute kidney failure, unspecified: Secondary | ICD-10-CM | POA: Diagnosis not present

## 2023-01-27 DIAGNOSIS — L89152 Pressure ulcer of sacral region, stage 2: Secondary | ICD-10-CM | POA: Diagnosis not present

## 2023-01-31 ENCOUNTER — Ambulatory Visit
Admission: RE | Admit: 2023-01-31 | Discharge: 2023-01-31 | Disposition: A | Discharge status: Home / Self Care | Payer: Medicare Other | Source: Ambulatory Visit | Attending: Primary Care | Admitting: Primary Care

## 2023-01-31 DIAGNOSIS — Z1231 Encounter for screening mammogram for malignant neoplasm of breast: Secondary | ICD-10-CM | POA: Insufficient documentation

## 2023-02-02 ENCOUNTER — Other Ambulatory Visit: Payer: Self-pay | Admitting: Primary Care

## 2023-02-02 DIAGNOSIS — I10 Essential (primary) hypertension: Secondary | ICD-10-CM | POA: Diagnosis not present

## 2023-02-02 DIAGNOSIS — N179 Acute kidney failure, unspecified: Secondary | ICD-10-CM | POA: Diagnosis not present

## 2023-02-02 DIAGNOSIS — G309 Alzheimer's disease, unspecified: Secondary | ICD-10-CM | POA: Diagnosis not present

## 2023-02-02 DIAGNOSIS — G4733 Obstructive sleep apnea (adult) (pediatric): Secondary | ICD-10-CM | POA: Diagnosis not present

## 2023-02-02 DIAGNOSIS — R928 Other abnormal and inconclusive findings on diagnostic imaging of breast: Secondary | ICD-10-CM

## 2023-02-02 DIAGNOSIS — L89152 Pressure ulcer of sacral region, stage 2: Secondary | ICD-10-CM | POA: Diagnosis not present

## 2023-02-02 DIAGNOSIS — F0282 Dementia in other diseases classified elsewhere, unspecified severity, with psychotic disturbance: Secondary | ICD-10-CM | POA: Diagnosis not present

## 2023-02-02 DIAGNOSIS — R921 Mammographic calcification found on diagnostic imaging of breast: Secondary | ICD-10-CM

## 2023-02-04 DIAGNOSIS — L89152 Pressure ulcer of sacral region, stage 2: Secondary | ICD-10-CM | POA: Diagnosis not present

## 2023-02-04 DIAGNOSIS — N179 Acute kidney failure, unspecified: Secondary | ICD-10-CM | POA: Diagnosis not present

## 2023-02-04 DIAGNOSIS — G4733 Obstructive sleep apnea (adult) (pediatric): Secondary | ICD-10-CM | POA: Diagnosis not present

## 2023-02-04 DIAGNOSIS — F0282 Dementia in other diseases classified elsewhere, unspecified severity, with psychotic disturbance: Secondary | ICD-10-CM | POA: Diagnosis not present

## 2023-02-04 DIAGNOSIS — G309 Alzheimer's disease, unspecified: Secondary | ICD-10-CM | POA: Diagnosis not present

## 2023-02-04 DIAGNOSIS — I10 Essential (primary) hypertension: Secondary | ICD-10-CM | POA: Diagnosis not present

## 2023-02-06 ENCOUNTER — Emergency Department (HOSPITAL_COMMUNITY): Payer: Medicare Other

## 2023-02-06 ENCOUNTER — Other Ambulatory Visit: Payer: Self-pay

## 2023-02-06 ENCOUNTER — Observation Stay (HOSPITAL_COMMUNITY)
Admission: EM | Admit: 2023-02-06 | Discharge: 2023-02-07 | Disposition: A | Discharge status: Home / Self Care | Payer: Medicare Other | Attending: Family Medicine | Admitting: Family Medicine

## 2023-02-06 DIAGNOSIS — R2689 Other abnormalities of gait and mobility: Secondary | ICD-10-CM | POA: Diagnosis not present

## 2023-02-06 DIAGNOSIS — Z1152 Encounter for screening for COVID-19: Secondary | ICD-10-CM | POA: Diagnosis not present

## 2023-02-06 DIAGNOSIS — Z853 Personal history of malignant neoplasm of breast: Secondary | ICD-10-CM | POA: Insufficient documentation

## 2023-02-06 DIAGNOSIS — D696 Thrombocytopenia, unspecified: Secondary | ICD-10-CM | POA: Diagnosis not present

## 2023-02-06 DIAGNOSIS — Z79899 Other long term (current) drug therapy: Secondary | ICD-10-CM | POA: Insufficient documentation

## 2023-02-06 DIAGNOSIS — R509 Fever, unspecified: Secondary | ICD-10-CM

## 2023-02-06 DIAGNOSIS — F03918 Unspecified dementia, unspecified severity, with other behavioral disturbance: Secondary | ICD-10-CM | POA: Diagnosis not present

## 2023-02-06 DIAGNOSIS — Z7982 Long term (current) use of aspirin: Secondary | ICD-10-CM | POA: Insufficient documentation

## 2023-02-06 DIAGNOSIS — E785 Hyperlipidemia, unspecified: Secondary | ICD-10-CM | POA: Diagnosis present

## 2023-02-06 DIAGNOSIS — J189 Pneumonia, unspecified organism: Secondary | ICD-10-CM | POA: Diagnosis not present

## 2023-02-06 DIAGNOSIS — I10 Essential (primary) hypertension: Secondary | ICD-10-CM | POA: Insufficient documentation

## 2023-02-06 DIAGNOSIS — R4182 Altered mental status, unspecified: Secondary | ICD-10-CM | POA: Diagnosis not present

## 2023-02-06 DIAGNOSIS — M6281 Muscle weakness (generalized): Secondary | ICD-10-CM | POA: Diagnosis not present

## 2023-02-06 DIAGNOSIS — Z7952 Long term (current) use of systemic steroids: Secondary | ICD-10-CM | POA: Insufficient documentation

## 2023-02-06 DIAGNOSIS — G9341 Metabolic encephalopathy: Secondary | ICD-10-CM | POA: Insufficient documentation

## 2023-02-06 DIAGNOSIS — K219 Gastro-esophageal reflux disease without esophagitis: Secondary | ICD-10-CM | POA: Diagnosis present

## 2023-02-06 DIAGNOSIS — R400 Somnolence: Secondary | ICD-10-CM

## 2023-02-06 LAB — CBC WITH DIFFERENTIAL/PLATELET
Abs Immature Granulocytes: 0.02 10*3/uL (ref 0.00–0.07)
Basophils Absolute: 0.1 10*3/uL (ref 0.0–0.1)
Basophils Relative: 1 %
Eosinophils Absolute: 0 10*3/uL (ref 0.0–0.5)
Eosinophils Relative: 1 %
HCT: 37.9 % (ref 36.0–46.0)
Hemoglobin: 12.3 g/dL (ref 12.0–15.0)
Immature Granulocytes: 0 %
Lymphocytes Relative: 6 %
Lymphs Abs: 0.4 10*3/uL — ABNORMAL LOW (ref 0.7–4.0)
MCH: 30.4 pg (ref 26.0–34.0)
MCHC: 32.5 g/dL (ref 30.0–36.0)
MCV: 93.8 fL (ref 80.0–100.0)
Monocytes Absolute: 0.2 10*3/uL (ref 0.1–1.0)
Monocytes Relative: 3 %
Neutro Abs: 5.9 10*3/uL (ref 1.7–7.7)
Neutrophils Relative %: 89 %
Platelets: 120 10*3/uL — ABNORMAL LOW (ref 150–400)
RBC: 4.04 MIL/uL (ref 3.87–5.11)
RDW: 15.2 % (ref 11.5–15.5)
WBC: 6.6 10*3/uL (ref 4.0–10.5)
nRBC: 0 % (ref 0.0–0.2)

## 2023-02-06 LAB — PROTIME-INR
INR: 1.3 — ABNORMAL HIGH (ref 0.8–1.2)
Prothrombin Time: 16.1 seconds — ABNORMAL HIGH (ref 11.4–15.2)

## 2023-02-06 LAB — COMPREHENSIVE METABOLIC PANEL
ALT: 22 U/L (ref 0–44)
AST: 26 U/L (ref 15–41)
Albumin: 3.4 g/dL — ABNORMAL LOW (ref 3.5–5.0)
Alkaline Phosphatase: 61 U/L (ref 38–126)
Anion gap: 12 (ref 5–15)
BUN: 14 mg/dL (ref 8–23)
CO2: 22 mmol/L (ref 22–32)
Calcium: 8.4 mg/dL — ABNORMAL LOW (ref 8.9–10.3)
Chloride: 101 mmol/L (ref 98–111)
Creatinine, Ser: 1.09 mg/dL — ABNORMAL HIGH (ref 0.44–1.00)
GFR, Estimated: 50 mL/min — ABNORMAL LOW (ref >60)
Glucose, Bld: 130 mg/dL — ABNORMAL HIGH (ref 70–99)
Potassium: 3.4 mmol/L — ABNORMAL LOW (ref 3.5–5.1)
Sodium: 135 mmol/L (ref 135–145)
Total Bilirubin: 0.9 mg/dL (ref 0.3–1.2)
Total Protein: 6.7 g/dL (ref 6.5–8.1)

## 2023-02-06 LAB — APTT: aPTT: 33 seconds (ref 24–36)

## 2023-02-06 LAB — CULTURE, BLOOD (ROUTINE X 2)

## 2023-02-06 LAB — SARS CORONAVIRUS 2 BY RT PCR: SARS Coronavirus 2 by RT PCR: NEGATIVE

## 2023-02-06 LAB — LACTIC ACID, PLASMA: Lactic Acid, Venous: 0.7 mmol/L (ref 0.5–1.9)

## 2023-02-06 MED ORDER — SODIUM CHLORIDE 0.9 % IV SOLN: INTRAVENOUS | Status: AC

## 2023-02-06 MED ORDER — ATORVASTATIN CALCIUM 40 MG PO TABS
40.0000 mg | ORAL_TABLET | Freq: Every day | ORAL | Status: DC
  Administered 2023-02-06 – 2023-02-07 (×2): 40 mg via ORAL
  Filled 2023-02-06 (×2): qty 1

## 2023-02-06 MED ORDER — PANTOPRAZOLE SODIUM 40 MG PO TBEC
40.0000 mg | DELAYED_RELEASE_TABLET | Freq: Every day | ORAL | Status: DC
  Administered 2023-02-06 – 2023-02-07 (×2): 40 mg via ORAL
  Filled 2023-02-06 (×2): qty 1

## 2023-02-06 MED ORDER — MEMANTINE HCL ER 28 MG PO CP24
28.0000 mg | ORAL_CAPSULE | Freq: Every day | ORAL | Status: DC
  Administered 2023-02-06 – 2023-02-07 (×2): 28 mg via ORAL
  Filled 2023-02-06 (×2): qty 1

## 2023-02-06 MED ORDER — ONDANSETRON HCL 4 MG PO TABS: 4.0000 mg | ORAL_TABLET | Freq: Four times a day (QID) | ORAL | Status: DC | PRN

## 2023-02-06 MED ORDER — SODIUM CHLORIDE 0.9 % IV SOLN
500.0000 mg | Freq: Once | INTRAVENOUS | Status: AC
  Administered 2023-02-06: 500 mg via INTRAVENOUS
  Filled 2023-02-06: qty 5

## 2023-02-06 MED ORDER — SODIUM CHLORIDE 0.9 % IV SOLN
1.0000 g | Freq: Once | INTRAVENOUS | Status: AC
  Administered 2023-02-06: 1 g via INTRAVENOUS
  Filled 2023-02-06: qty 10

## 2023-02-06 MED ORDER — SODIUM CHLORIDE 0.9 % IV SOLN
1.0000 g | INTRAVENOUS | Status: DC
  Administered 2023-02-07: 1 g via INTRAVENOUS
  Filled 2023-02-06: qty 10

## 2023-02-06 MED ORDER — SODIUM CHLORIDE 0.9 % IV BOLUS
1000.0000 mL | Freq: Once | INTRAVENOUS | Status: AC
  Administered 2023-02-06: 1000 mL via INTRAVENOUS

## 2023-02-06 MED ORDER — DONEPEZIL HCL 10 MG PO TABS
10.0000 mg | ORAL_TABLET | Freq: Every day | ORAL | Status: DC
  Administered 2023-02-06: 10 mg via ORAL
  Filled 2023-02-06: qty 1

## 2023-02-06 MED ORDER — SERTRALINE HCL 100 MG PO TABS
200.0000 mg | ORAL_TABLET | Freq: Every day | ORAL | Status: DC
  Administered 2023-02-06: 200 mg via ORAL
  Filled 2023-02-06: qty 2

## 2023-02-06 MED ORDER — ACETAMINOPHEN 500 MG PO TABS
1000.0000 mg | ORAL_TABLET | Freq: Once | ORAL | Status: AC
  Administered 2023-02-06: 1000 mg via ORAL
  Filled 2023-02-06: qty 2

## 2023-02-06 MED ORDER — ENOXAPARIN SODIUM 40 MG/0.4ML IJ SOSY: 40.0000 mg | PREFILLED_SYRINGE | INTRAMUSCULAR | Status: DC

## 2023-02-06 MED ORDER — ACETAMINOPHEN 650 MG RE SUPP: 650.0000 mg | Freq: Four times a day (QID) | RECTAL | Status: DC | PRN

## 2023-02-06 MED ORDER — ASPIRIN 81 MG PO TBEC
81.0000 mg | DELAYED_RELEASE_TABLET | Freq: Every day | ORAL | Status: DC
  Administered 2023-02-06 – 2023-02-07 (×2): 81 mg via ORAL
  Filled 2023-02-06 (×2): qty 1

## 2023-02-06 MED ORDER — ONDANSETRON HCL 4 MG/2ML IJ SOLN: 4.0000 mg | Freq: Four times a day (QID) | INTRAMUSCULAR | Status: DC | PRN

## 2023-02-06 MED ORDER — SODIUM CHLORIDE 0.9 % IV SOLN
500.0000 mg | INTRAVENOUS | Status: DC
  Administered 2023-02-07: 500 mg via INTRAVENOUS
  Filled 2023-02-06: qty 5

## 2023-02-06 MED ORDER — ALBUTEROL SULFATE (2.5 MG/3ML) 0.083% IN NEBU: 2.5000 mg | INHALATION_SOLUTION | RESPIRATORY_TRACT | Status: DC | PRN

## 2023-02-06 MED ORDER — SODIUM CHLORIDE 0.9 % IV SOLN: 500.0000 mg | INTRAVENOUS | Status: DC

## 2023-02-06 MED ORDER — ACETAMINOPHEN 325 MG PO TABS: 650.0000 mg | ORAL_TABLET | Freq: Four times a day (QID) | ORAL | Status: DC | PRN

## 2023-02-06 MED ORDER — FESOTERODINE FUMARATE ER 8 MG PO TB24
8.0000 mg | ORAL_TABLET | Freq: Every day | ORAL | Status: DC
  Administered 2023-02-06 – 2023-02-07 (×2): 8 mg via ORAL
  Filled 2023-02-06 (×3): qty 1

## 2023-02-06 MED ORDER — RISPERIDONE 1 MG PO TABS
2.0000 mg | ORAL_TABLET | Freq: Every day | ORAL | Status: DC
  Administered 2023-02-06: 2 mg via ORAL
  Filled 2023-02-06: qty 2

## 2023-02-06 MED ORDER — SERTRALINE HCL 50 MG PO TABS: 75.0000 mg | ORAL_TABLET | Freq: Every day | ORAL | Status: DC

## 2023-02-06 MED ORDER — TRAZODONE HCL 50 MG PO TABS: 25.0000 mg | ORAL_TABLET | Freq: Every evening | ORAL | Status: DC | PRN

## 2023-02-06 MED ORDER — LISINOPRIL 5 MG PO TABS
5.0000 mg | ORAL_TABLET | Freq: Every day | ORAL | Status: DC
  Administered 2023-02-06 – 2023-02-07 (×2): 5 mg via ORAL
  Filled 2023-02-06 (×2): qty 1

## 2023-02-06 MED ORDER — METOPROLOL TARTRATE 5 MG/5ML IV SOLN: 5.0000 mg | Freq: Four times a day (QID) | INTRAVENOUS | Status: DC | PRN

## 2023-02-06 NOTE — H&P (Signed)
History and Physical  Caroline French [id number] DOB: [birth year] DOA: 02/06/2023  PCP: Doreene Nest, NP   Chief Complaint: lethargy   HPI: Caroline French is a 88 y.o. female with medical history significant for dementia, essential hypertension, overactive bladder being admitted to the hospital with complaints of worsening lethargy and altered mental status found to have likely community-acquired pneumonia.  Per family report to EMS, patient had decreased p.o. intake the last few days, as well as decreased urine output.  Normally the patient is confused at baseline, but quite chatty and talkative.  No report of nausea or vomiting, no diarrhea, no cough or shortness of breath.  ED Course: Evaluation here shows low-grade fever 38.1 centigrade, respirations 25, blood pressure 143/73 saturating well on room air.  Lab work was done shows normal CBC, very slight creatinine bump 1.09 from baseline 0.9.  Chest x-ray shows possible infiltrate in the left base, patient was given empiric IV azithromycin and Rocephin.  Urinalysis is still pending.  Review of Systems: Please see HPI for pertinent positives and negatives. A complete 10 system review of systems could not be completed due to the patient's baseline dementia.  Past Medical History:  Diagnosis Date   Breast cancer [facility]) 1992's   left breast   Dementia with behavioral disturbance (HCC)    Diverticulitis    Essential hypertension    Glaucoma    Hallucinations    Overactive bladder    Past Surgical History:  Procedure Laterality Date   ABDOMINAL HYSTERECTOMY     BREAST BIOPSY     BREAST LUMPECTOMY Left 1990's   CATARACT EXTRACTION, BILATERAL  08/012020    Social History:  reports that she has never smoked. She has never used smokeless tobacco. She reports that she does not drink alcohol and does not use drugs.   Allergies  Allergen Reactions   Penicillins Rash    Did it involve swelling of the face/tongue/throat, SOB, or low BP?  Yes Did it involve sudden or severe rash/hives, skin peeling, or any reaction on the inside of your mouth or nose? Unk Did you need to seek medical attention at a hospital or doctor's office? Unk When did it last happen? "I was young" If all above answers are "NO", may proceed with cephalosporin use.     No family history on file.   Prior to Admission medications   Medication Sig Start Date End Date Taking? Authorizing Provider  Ascorbic Acid (VITAMIN C PO) Take 1 tablet by mouth daily. Patient not taking: Reported on 12/13/2022    [provider]  aspirin (ASPIRIN LOW DOSE) 81 MG EC tablet TAKE 1 TABLET BY MOUTH ONCE DAILY 01/22/22   Doreene Nest, NP  atorvastatin (LIPITOR) 40 MG tablet TAKE 1 TABLET BY MOUTH DAILY FOR CHOLESTEROL 02/25/22   Doreene Nest, NP  Calcium Citrate-Vitamin D3 315-6.25 MG-MCG TABS Take 1 tablet by mouth daily. 01/08/22   [provider]  docusate sodium (COLACE) 100 MG capsule Take 100 mg by mouth daily as needed for mild constipation.    [provider]  donepezil (ARICEPT) 10 MG tablet Take 1 tablet (10 mg total) by mouth at bedtime. For memory. 01/30/19   Doreene Nest, NP  ferrous sulfate 325 (65 FE) MG tablet Take 325 mg by mouth daily. Patient not taking: Reported on 12/13/2022    [provider]  fluticasone (FLONASE) 50 MCG/ACT nasal spray INSTILL 1 SPRAY IN EACH NOSTRIL TWICE DAILY AS NEEDED FOR ALLERGIES Patient taking  differently: Place 1 spray into both nostrils 2 (two) times daily as needed for allergies. 04/30/21   Doreene Nest, NP  hydrOXYzine (ATARAX) 10 MG tablet TAKE 1-2 TABLETS (10-20 MG TOTAL) BY MOUTH DAILY AS NEEDED FOR ANXIETY. 12/01/22   Doreene Nest, NP  latanoprost (XALATAN) 0.005 % ophthalmic solution Place 1 drop into both eyes at bedtime. 04/20/17   [provider]  lisinopril (ZESTRIL) 5 MG tablet Take 1 tablet (5 mg total) by mouth daily. for blood pressure. 02/25/22    Doreene Nest, NP  loratadine (CLARITIN) 10 MG tablet Take 10 mg by mouth daily.    [provider]  memantine (NAMENDA XR) 28 MG CP24 24 hr capsule Take by mouth. 03/05/22   [provider]  Multiple Vitamin (MULTI-VITAMINS) TABS Take 1 tablet by mouth daily. 01/11/18   [provider]  Omega-3 Fatty Acids (SEA-OMEGA 30) 1200 MG CAPS Take 1 capsule by mouth daily. Patient not taking: Reported on 12/13/2022 01/11/18   [provider]  omeprazole (PRILOSEC) 20 MG capsule Take 1 capsule (20 mg total) by mouth daily. For heartburn 02/25/22   Doreene Nest, NP  polyethylene glycol powder (GLYCOLAX/MIRALAX) 17 GM/SCOOP powder Take 17 g by mouth daily as needed for mild constipation. Pt is  taking at every day    [provider]  risperiDONE (RISPERDAL) 1 MG tablet Take 2 mg by mouth at bedtime. 07/12/22   [provider]  sertraline (ZOLOFT) 25 MG tablet Take 1 tablet (25 mg total) by mouth daily. Take with 50mg  dose. 02/25/22   Doreene Nest, NP  sertraline (ZOLOFT) 50 MG tablet Take 1 tablet (50 mg total) by mouth daily. For anxiety. Take with 25 mg dose. 02/25/22   Doreene Nest, NP  TOVIAZ 8 MG TB24 tablet Take 1 tablet (8 mg total) by mouth daily. For overactive bladder 02/25/22   Doreene Nest, NP    Physical Exam: BP 124/61   Pulse 72   Temp (!) 100.6 F (38.1 C) (Oral)   Resp (!) 22   Ht 5\' 7"  (1.702 m)   Wt 79.8 kg   SpO2 95%   BMI 27.55 kg/m   General: Elderly female sitting up in bed on room air, on my exam she is alert, but oriented only to self.  Denies any complaints, is comfortable. Eyes: EOMI, clear conjuctivae, white sclerea Neck: supple, no masses, trachea mildline  Cardiovascular: RRR, no murmurs or rubs, she has significant bilateral lower extremity edema, without tenderness, no pitting, no erythema Respiratory: clear to auscultation bilaterally, no wheezes, no crackles  Abdomen: soft, nontender,  nondistended, normal bowel tones heard  Skin: dry, no rashes  Musculoskeletal: no joint effusions, normal range of motion  Psychiatric: appropriate affect, normal speech  Neurologic: extraocular muscles intact, clear speech, moving all extremities with intact sensorium          Labs on Admission:  Basic Metabolic Panel: Recent Labs  Lab 02/06/23 0449  NA 135  K 3.4*  CL 101  CO2 22  GLUCOSE 130*  BUN 14  CREATININE 1.09*  CALCIUM 8.4*   Liver Function Tests: Recent Labs  Lab 02/06/23 0449  AST 26  ALT 22  ALKPHOS 61  BILITOT 0.9  PROT 6.7  ALBUMIN 3.4*   No results for input(s): "LIPASE", "AMYLASE" in the last 168 hours. No results for input(s): "AMMONIA" in the last 168 hours. CBC: Recent Labs  Lab 02/06/23 0449  WBC 6.6  NEUTROABS  5.9  HGB 12.3  HCT 37.9  MCV 93.8  PLT 120*   Cardiac Enzymes: No results for input(s): "CKTOTAL", "CKMB", "CKMBINDEX", "TROPONINI" in the last 168 hours.  BNP (last 3 results) No results for input(s): "BNP" in the last 8760 hours.  ProBNP (last 3 results) No results for input(s): "PROBNP" in the last 8760 hours.  CBG: No results for input(s): "GLUCAP" in the last 168 hours.  Radiological Exams on Admission: DG Chest Port 1 View  Result Date: 02/06/2023 CLINICAL DATA:  Altered mental status.  Question ule sepsis. EXAM: PORTABLE CHEST 1 VIEW COMPARISON:  09/07/2022 FINDINGS: Lung apices obscured by the patient's head. Right lung clear. There is left base atelectasis/infiltrate. Cardiopericardial silhouette is at upper limits of normal for size. Thoracolumbar scoliosis evident with osteopenia of the visualized bony anatomy. Telemetry leads overlie the chest. IMPRESSION: Left base atelectasis/infiltrate. Electronically Signed   By: Kennith Center M.D.   On: 02/06/2023 06:23    Assessment/Plan Principal Problem:   CAP (community acquired pneumonia)-admitted with low-grade fever, tachypnea but stable on room air.  Imaging reveals  possible left lower lobe atelectasis/infiltrate.  It appears she is already little more energetic than when she first arrived. -Observation admission -Empiric IV azithromycin and Rocephin -Gentle IV fluids for the next 24 hours -Albuterol as needed cough/shortness of breath -Follow urinalysis/culture if indicated  Other chronic issues being addressed, with medications resumed as appropriate:   Hyperlipidemia Reported sacral wound-wound ostomy nurse consult   Dementia with behavioral disturbance (HCC)   Gastroesophageal reflux disease  DVT prophylaxis: Lovenox     Code Status: Full Code  Consults called: None  Admission status: Observation  Time spent: 36 minutes  Aileen Amore Sharlette Dense MD [facility] Pager [phone]  If 7PM-7AM, please contact night-coverage [email or web address] Password [facility]  02/06/2023, 7:51 AM

## 2023-02-06 NOTE — ED Provider Notes (Signed)
[place] EMERGENCY DEPARTMENT AT [facility] Provider Note  CSN: [id number] Arrival date & time: 02/06/23 0416  Chief Complaint(s) Altered Mental Status  HPI Caroline French is a 88 y.o. female with a past medical history listed below including dementia who presents to the emergency department with worsening lethargy and change in her mental status from her baseline.  Family reported to EMS that the patient has had decreased p.o. intake as well as decreased urinary output.  Patient is normally "chatty but has been less talkative recently.  No reported nausea or vomiting.  No diarrhea.  No coughing.  The history is provided by the EMS personnel.    Past Medical History Past Medical History:  Diagnosis Date   Breast cancer [facility]) 1992's   left breast   Dementia with behavioral disturbance (HCC)    Diverticulitis    Essential hypertension    Glaucoma    Hallucinations    Overactive bladder    Patient Active Problem List   Diagnosis Date Noted   Chronic constipation 12/08/2022   Pressure injury of sacral region, stage 2 (HCC) 12/08/2022   Generalized weakness 09/16/2022   Meningioma (HCC) 09/16/2022   Cerumen impaction 04/23/2022   Sepsis (HCC) 12/23/2021   Dementia (HCC) 12/23/2021   AKI (acute kidney injury) (HCC) 12/23/2021   CAP (community acquired pneumonia) 12/22/2021   Urgency of urination 09/23/2020   Gastroesophageal reflux disease 01/29/2020   Altered mental status 09/11/2019   Sleep apnea 10/19/2018   Prediabetes 07/21/2018   Hyperlipidemia 01/13/2018   Dementia with behavioral disturbance (HCC) 01/13/2018   Overactive bladder 01/13/2018   Glaucoma 01/13/2018   Essential hypertension 01/13/2018   Home Medication(s) Prior to Admission medications   Medication Sig Start Date End Date Taking? Authorizing Provider  Ascorbic Acid (VITAMIN C PO) Take 1 tablet by mouth daily. Patient not taking: Reported on 12/13/2022    [provider]  aspirin  (ASPIRIN LOW DOSE) 81 MG EC tablet TAKE 1 TABLET BY MOUTH ONCE DAILY 01/22/22   Doreene Nest, NP  atorvastatin (LIPITOR) 40 MG tablet TAKE 1 TABLET BY MOUTH DAILY FOR CHOLESTEROL 02/25/22   Doreene Nest, NP  Calcium Citrate-Vitamin D3 315-6.25 MG-MCG TABS Take 1 tablet by mouth daily. 01/08/22   [provider]  docusate sodium (COLACE) 100 MG capsule Take 100 mg by mouth daily as needed for mild constipation.    [provider]  donepezil (ARICEPT) 10 MG tablet Take 1 tablet (10 mg total) by mouth at bedtime. For memory. 01/30/19   Doreene Nest, NP  ferrous sulfate 325 (65 FE) MG tablet Take 325 mg by mouth daily. Patient not taking: Reported on 12/13/2022    [provider]  fluticasone (FLONASE) 50 MCG/ACT nasal spray INSTILL 1 SPRAY IN EACH NOSTRIL TWICE DAILY AS NEEDED FOR ALLERGIES Patient taking differently: Place 1 spray into both nostrils 2 (two) times daily as needed for allergies. 04/30/21   Doreene Nest, NP  hydrOXYzine (ATARAX) 10 MG tablet TAKE 1-2 TABLETS (10-20 MG TOTAL) BY MOUTH DAILY AS NEEDED FOR ANXIETY. 12/01/22   Doreene Nest, NP  latanoprost (XALATAN) 0.005 % ophthalmic solution Place 1 drop into both eyes at bedtime. 04/20/17   [provider]  lisinopril (ZESTRIL) 5 MG tablet Take 1 tablet (5 mg total) by mouth daily. for blood pressure. 02/25/22   Doreene Nest, NP  loratadine (CLARITIN) 10 MG tablet Take 10 mg by mouth daily.    [provider]  memantine (NAMENDA XR) 28 MG CP24 24 hr capsule Take by mouth. 03/05/22   [provider]  Multiple Vitamin (MULTI-VITAMINS) TABS Take 1 tablet by mouth daily. 01/11/18   [provider]  Omega-3 Fatty Acids (SEA-OMEGA 30) 1200 MG CAPS Take 1 capsule by mouth daily. Patient not taking: Reported on 12/13/2022 01/11/18   [provider]  omeprazole (PRILOSEC) 20 MG capsule Take 1 capsule (20 mg total) by mouth daily. For heartburn 02/25/22    Doreene Nest, NP  polyethylene glycol powder (GLYCOLAX/MIRALAX) 17 GM/SCOOP powder Take 17 g by mouth daily as needed for mild constipation. Pt is  taking at every day    [provider]  risperiDONE (RISPERDAL) 1 MG tablet Take 2 mg by mouth at bedtime. 07/12/22   [provider]  sertraline (ZOLOFT) 25 MG tablet Take 1 tablet (25 mg total) by mouth daily. Take with 50mg  dose. 02/25/22   Doreene Nest, NP  sertraline (ZOLOFT) 50 MG tablet Take 1 tablet (50 mg total) by mouth daily. For anxiety. Take with 25 mg dose. 02/25/22   Doreene Nest, NP  TOVIAZ 8 MG TB24 tablet Take 1 tablet (8 mg total) by mouth daily. For overactive bladder 02/25/22   Doreene Nest, NP                                                                                                                                    Allergies Penicillins  Review of Systems Review of Systems As noted in HPI  Physical Exam Vital Signs  I have reviewed the triage vital signs BP 124/61   Pulse 72   Temp (!) 100.6 F (38.1 C) (Oral)   Resp (!) 22   Ht 5\' 7"  (1.702 m)   Wt 79.8 kg   SpO2 95%   BMI 27.55 kg/m   Physical Exam Vitals reviewed.  Constitutional:      General: She is not in acute distress.    Appearance: She is well-developed. She is not diaphoretic.  HENT:     Head: Normocephalic and atraumatic.     Nose: Nose normal.  Eyes:     General: No scleral icterus.       Right eye: No discharge.        Left eye: No discharge.     Conjunctiva/sclera: Conjunctivae normal.     Pupils: Pupils are equal, round, and reactive to light.  Cardiovascular:     Rate and Rhythm: Normal rate and regular rhythm.     Heart sounds: No murmur heard.    No friction rub. No gallop.  Pulmonary:     Effort: Pulmonary effort is normal. No respiratory distress.     Breath sounds: Normal breath sounds. No stridor. No rales.  Abdominal:     General: There is no distension.     Palpations: Abdomen is  soft.     Tenderness: There is  no abdominal tenderness.  Musculoskeletal:        General: No tenderness.     Cervical back: Normal range of motion and neck supple.  Skin:    General: Skin is warm and dry.     Findings: No erythema or rash.  Neurological:     Mental Status: She is alert. She is disoriented.     Comments: Slow to respond Follows command Moves all extremities     ED Results and Treatments Labs (all labs ordered are listed, but only abnormal results are displayed) Labs Reviewed  COMPREHENSIVE METABOLIC PANEL - Abnormal; Notable for the following components:      Result Value   Potassium 3.4 (*)    Glucose, Bld 130 (*)    Creatinine, Ser 1.09 (*)    Calcium 8.4 (*)    Albumin 3.4 (*)    GFR, Estimated 50 (*)    All other components within normal limits  CBC WITH DIFFERENTIAL/PLATELET - Abnormal; Notable for the following components:   Platelets 120 (*)    Lymphs Abs 0.4 (*)    All other components within normal limits  PROTIME-INR - Abnormal; Notable for the following components:   Prothrombin Time 16.1 (*)    INR 1.3 (*)    All other components within normal limits  SARS CORONAVIRUS 2 BY RT PCR  CULTURE, BLOOD (ROUTINE X 2)  CULTURE, BLOOD (ROUTINE X 2)  LACTIC ACID, PLASMA  APTT  URINALYSIS, W/ REFLEX TO CULTURE (INFECTION SUSPECTED)                                                                                                                         EKG  EKG Interpretation  Date/Time:  Sunday February 06 2023 04:53:30 EDT Ventricular Rate:  88 PR Interval:  145 QRS Duration: 76 QT Interval:  458 QTC Calculation: 555 R Axis:   48 Text Interpretation: Sinus rhythm Borderline T abnormalities, anterior leads Prolonged QT interval Confirmed by Drema Pry [phone]) on 02/06/2023 7:15:18 AM       Radiology DG Chest Port 1 View  Result Date: 02/06/2023 CLINICAL DATA:  Altered mental status.  Question ule sepsis. EXAM: PORTABLE CHEST 1 VIEW  COMPARISON:  09/07/2022 FINDINGS: Lung apices obscured by the patient's head. Right lung clear. There is left base atelectasis/infiltrate. Cardiopericardial silhouette is at upper limits of normal for size. Thoracolumbar scoliosis evident with osteopenia of the visualized bony anatomy. Telemetry leads overlie the chest. IMPRESSION: Left base atelectasis/infiltrate. Electronically Signed   By: Kennith Center M.D.   On: 02/06/2023 06:23    Medications Ordered in ED Medications  cefTRIAXone (ROCEPHIN) 1 g in sodium chloride 0.9 % 100 mL IVPB (has no administration in time range)  azithromycin (ZITHROMAX) 500 mg in sodium chloride 0.9 % 250 mL IVPB (has no administration in time range)  sodium chloride 0.9 % bolus 1,000 mL (0 mLs Intravenous Stopped 02/06/23 0637)  acetaminophen (TYLENOL) tablet 1,000 mg (1,000 mg Oral Given 02/06/23 0517)  Procedures Procedures  (including critical care time)  Medical Decision Making / ED Course  Click here for ABCD2, HEART and other calculators  Medical Decision Making Amount and/or Complexity of Data Reviewed Labs: ordered. Decision-making details documented in ED Course. Radiology: ordered and independent interpretation performed. Decision-making details documented in ED Course. ECG/medicine tests: ordered and independent interpretation performed. Decision-making details documented in ED Course.  Risk OTC drugs.    This patient presents to the ED for concern of lethargy and fever, this involves an extensive number of treatment options, and is a complaint that carries with it a high risk of complications and morbidity. The differential diagnosis includes but not limited to:  Likely infectious. Will need to determine source and rule out sepsis.   Initial intervention:  Total IV fluids  Work up Interpretation and  Management:  Cardiac Monitoring/EKG: EKG without acute ischemic changes, dysrhythmias, blocks.  Laboratory Tests ordered listed below with my independent interpretation: CBC without leukocytosis or anemia. Bolick panel without significant electrolyte derangements.  Mild renal sufficiency without evidence of AKI.  No biliary obstruction. Lactic acid normal. COVID-negative UA needs to be obtained   Imaging Studies ordered listed below with my independent interpretation: Chest x-ray with possible PNA.    Reassessment: Patient started on empiric antibiotics to cover for both possible pneumonia and UTI. Will consult medicine regarding admission for the patient        Final Clinical Impression(s) / ED Diagnoses Final diagnoses:  Somnolence  Fever in adult  Community acquired pneumonia, unspecified laterality           This chart was dictated using voice recognition software.  Despite best efforts to proofread,  errors can occur which can change the documentation meaning.    Nira Conn, MD 02/06/23 [phone]

## 2023-02-06 NOTE — ED Triage Notes (Signed)
BIB [facility] from home c/o AMS. Hx of dementia, but family reported that she is typically alert and oriented to self. Family reports that pt has had decreased PO intake, difficulty urinating, hasn't been as "chatty" as usual. Non ambulatory at baseline due to dementia.  EMS vitals:126/60, HR 74, 96%, CBG 174  Received 200 mL NS by EMS.

## 2023-02-06 NOTE — ED Notes (Signed)
In and out cath attempted by myself and charge RN without success due to difficult anatomy. Pure wick placed for urine specimen collection. Provider notified.

## 2023-02-07 DIAGNOSIS — F03918 Unspecified dementia, unspecified severity, with other behavioral disturbance: Secondary | ICD-10-CM | POA: Diagnosis not present

## 2023-02-07 DIAGNOSIS — J189 Pneumonia, unspecified organism: Secondary | ICD-10-CM | POA: Diagnosis not present

## 2023-02-07 DIAGNOSIS — D696 Thrombocytopenia, unspecified: Secondary | ICD-10-CM

## 2023-02-07 MED ORDER — AZITHROMYCIN 250 MG PO TABS: 500.0000 mg | ORAL_TABLET | Freq: Every day | ORAL | Status: DC

## 2023-02-07 MED ORDER — DOXYCYCLINE HYCLATE 50 MG PO CAPS: 100.0000 mg | ORAL_CAPSULE | Freq: Two times a day (BID) | ORAL | 0 refills | Status: AC

## 2023-02-07 MED ORDER — CEFADROXIL 500 MG PO CAPS: 500.0000 mg | ORAL_CAPSULE | Freq: Two times a day (BID) | ORAL | 0 refills | Status: AC

## 2023-02-07 NOTE — Evaluation (Signed)
Physical Therapy Evaluation Patient Details Name: Caroline French MRN: [id number] DOB: [birth year] Today's Date: 02/07/2023  History of Present Illness  Pt is 88 yo female admitted 02/06/23 with lethargy and AMS found to have CAP.  Pt with hx of dementia, HTN, overactive bladder, and breast CA.  Clinical Impression  Pt admitted with above diagnosis. At baseline, pt has 24 hr care. She requires assist with transfers at baseline and is supervision to ambulate in house without AD.  Pt typically performs flight of steps to bedroom with assist.  Today, pt requiring mod A transfers and ambulated 60' with RW and min A.  Aide was present and reports similar/potentially slightly weaker compared to baseline.  Pt was getting HHPT - recommend continuing.  Also, recommended moving pt to first floor of home.  Pt currently with functional limitations due to the deficits listed below (see PT Problem List). Pt will benefit from acute skilled PT to increase their independence and safety with mobility to allow discharge.          Recommendations for follow up therapy are one component of a multi-disciplinary discharge planning process, led by the attending physician.  Recommendations may be updated based on patient status, additional functional criteria and insurance authorization.  Follow Up Recommendations       Assistance Recommended at Discharge Frequent or constant Supervision/Assistance  Patient can return home with the following  A lot of help with walking and/or transfers;A lot of help with bathing/dressing/bathroom;Assistance with cooking/housework;Help with stairs or ramp for entrance    Equipment Recommendations None recommended by PT  Recommendations for Other Services       Functional Status Assessment Patient has had a recent decline in their functional status and demonstrates the ability to make significant improvements in function in a reasonable and predictable amount of time.     Precautions /  Restrictions Precautions Precautions: Fall      Mobility  Bed Mobility Overal bed mobility: Needs Assistance Bed Mobility: Supine to Sit     Supine to sit: Min assist     General bed mobility comments: increased time with multimodal cues and use of bed pad to assist    Transfers Overall transfer level: Needs assistance Equipment used: Rolling walker (2 wheels) Transfers: Sit to/from Stand Sit to Stand: Mod assist           General transfer comment: Worked on leaning forward and getting feet under body to stand.  Pt with tendency to lean backward to stand.  Required use of momentum and mod A to rise    Ambulation/Gait Ambulation/Gait assistance: Min assist Gait Distance (Feet): 60 Feet Assistive device: Rolling walker (2 wheels), 1 person hand held assist Gait Pattern/deviations: Step-to pattern, Shuffle, Decreased stride length Gait velocity: decreased     General Gait Details: Short steps with light min A to steady initially; improved with RW but needs assist to manage RW  Stairs            Wheelchair Mobility    Modified Rankin (Stroke Patients Only)       Balance Overall balance assessment: Needs assistance Sitting-balance support: No upper extremity supported Sitting balance-Leahy Scale: Good     Standing balance support: Bilateral upper extremity supported, Single extremity supported Standing balance-Leahy Scale: Poor Standing balance comment: Requiring RW or HHA of 1                             Pertinent Vitals/Pain  Pain Assessment Pain Assessment: No/denies pain    Home Living Family/patient expects to be discharged to:: Private residence Living Arrangements: Children (daughter) Available Help at Discharge: Family;Personal care attendant;Available 24 hours/day (between family and PCA 24 hr care) Type of Home: House Home Access: Stairs to enter Entrance Stairs-Rails: Right Entrance Stairs-Number of Steps: 3 Alternate Level  Stairs-Number of Steps: 14 Home Layout: Two level;Bed/bath upstairs Home Equipment: Agricultural consultant (2 wheels);BSC/3in1 Additional Comments: Bedroom upstairs but aide reports daughter considering moving her downstairs    Prior Function Prior Level of Function : Needs assist             Mobility Comments: Supervision to ambulate in home and able to do stairs with assist; needs assist supine/sit and sit to stand baseline; walks without AD ADLs Comments: Supervision/min A with adls     Hand Dominance   Dominant Hand: Right    Extremity/Trunk Assessment   Upper Extremity Assessment Upper Extremity Assessment: Defer to OT evaluation    Lower Extremity Assessment Lower Extremity Assessment: LLE deficits/detail;RLE deficits/detail;Difficult to assess due to impaired cognition RLE Deficits / Details: ROM WFL; MMT at least 3/5 but not following furhter commands LLE Deficits / Details: ROM WFL; MMT at least 3/5 but not following furhter commands    Cervical / Trunk Assessment Cervical / Trunk Assessment: Kyphotic  Communication   Communication: Expressive difficulties  Cognition Arousal/Alertness: Awake/alert Behavior During Therapy: WFL for tasks assessed/performed Overall Cognitive Status: History of cognitive impairments - at baseline                                 General Comments: Hx of dementia - aid reports slightly more confused with situation compared to baseline        General Comments General comments (skin integrity, edema, etc.): Pt  did have some tremors throughout. Pt's aide, [place], was present and reports pt has those at baseline and pt needs assist at baseline.  Did eduated on positioning for transfers and encouraging reaching forward to targets to encourage leaning forward prior to standing. Spoke with daughter on phone afterward and recommended moving pt downstairs if possible.  She agrees that is in the future.  States that pt has had 2 recent  hospitalizations and even SNF stays where she does not mobilize much and does better when returns home.  PT verbalized understanding as is common in dementia.    Exercises     Assessment/Plan    PT Assessment Patient needs continued PT services  PT Problem List Decreased strength;Decreased range of motion;Decreased activity tolerance;Decreased balance;Decreased mobility;Decreased knowledge of precautions;Decreased safety awareness;Decreased knowledge of use of DME;Decreased cognition;Decreased coordination       PT Treatment Interventions DME instruction;Therapeutic exercise;Gait training;Stair training;Functional mobility training;Therapeutic activities;Patient/family education;Cognitive remediation;Balance training    PT Goals (Current goals can be found in the Care Plan section)  Acute Rehab PT Goals Patient Stated Goal: return home PT Goal Formulation: With patient/family Time For Goal Achievement: 02/21/23 Potential to Achieve Goals: Good    Frequency Min 1X/week     Co-evaluation               AM-PAC PT "6 Clicks" Mobility  Outcome Measure Help needed turning from your back to your side while in a flat bed without using bedrails?: A Little Help needed moving from lying on your back to sitting on the side of a flat bed without using bedrails?: A Little Help  needed moving to and from a bed to a chair (including a wheelchair)?: A Little Help needed standing up from a chair using your arms (e.g., wheelchair or bedside chair)?: A Lot Help needed to walk in hospital room?: A Little Help needed climbing 3-5 steps with a railing? : Total 6 Click Score: 15    End of Session Equipment Utilized During Treatment: Gait belt Activity Tolerance: Patient tolerated treatment well Patient left: with chair alarm set;in chair;with call bell/phone within reach;with family/visitor present Nurse Communication: Mobility status PT Visit Diagnosis: Other abnormalities of gait and mobility  (R26.89);Muscle weakness (generalized) (M62.81)    Time: 4782-9562 PT Time Calculation (min) (ACUTE ONLY): 30 min   Charges:   PT Evaluation $PT Eval Low Complexity: 1 Low PT Treatments $Gait Training: 8-22 mins        Anise Salvo, PT Acute Rehab [facility] Rehab [phone]   Rayetta Humphrey 02/07/2023, 11:54 AM

## 2023-02-07 NOTE — TOC Progression Note (Signed)
Transition of Care [facility]) - Progression Note    Patient Details  Name: Caroline French MRN: [id number] Date of Birth: [birth year]  [facility]) CM/SW Contact  Beckie Busing, RN Phone Number:[phone]  02/07/2023, 12:01 PM  Clinical Narrative:    [facility] acknowledges consult for "PT, RN (already has at home) " CM at bedside to confirm with caregiver present at bedside that patient does have caregivers RN and PT through [facility]. No other needs noted at this time. TOC will sign off.         Expected Discharge Plan and Services         Expected Discharge Date: 02/07/23                                     Social Determinants of Health (SDOH) Interventions SDOH Screenings   Food Insecurity: No Food Insecurity (03/30/2022)  Housing: Low Risk  (03/30/2022)  Transportation Needs: No Transportation Needs (03/30/2022)  Alcohol Screen: Low Risk  (03/30/2022)  Depression (PHQ2-9): Medium Risk (12/08/2022)  Financial Resource Strain: Low Risk  (03/30/2022)  Physical Activity: Insufficiently Active (03/30/2022)  Social Connections: Socially Integrated (03/30/2022)  Stress: No Stress Concern Present (03/30/2022)  Tobacco Use: Low Risk  (01/31/2023)    Readmission Risk Interventions     No data to display

## 2023-02-07 NOTE — Consult Note (Deleted)
WOC Nurse Consult Note: Reason for Consult: sacrum  Wound type: Pressure Injury POA: Yes/No/NA Measurement: Wound bed: Drainage (amount, consistency, odor)  Periwound: Dressing procedure/placement/frequency:   Discussed POC with patient and bedside nurse.  Re consult if needed, will not follow at this time. Thanks  Suleyman Ehrman [employer], RN,CWOCN, CNS, CWON-AP [phone])

## 2023-02-07 NOTE — Progress Notes (Signed)
PHARMACIST - PHYSICIAN COMMUNICATION  CONCERNING: Antibiotic IV to Oral Route Change Policy  RECOMMENDATION: This patient is receiving azithromycin by the intravenous route.  Based on criteria approved by the Pharmacy and Therapeutics Committee, the antibiotic(s) is/are being converted to the equivalent oral dose form(s).   DESCRIPTION: These criteria include: Patient being treated for a respiratory tract infection, urinary tract infection, cellulitis or clostridium difficile associated diarrhea if on metronidazole The patient is not neutropenic and does not exhibit a GI malabsorption state The patient is eating (either orally or via tube) and/or has been taking other orally administered medications for a least 24 hours The patient is improving clinically and has a Tmax < 100.5  If you have questions about this conversion, please contact the Pharmacy Department   Pricilla Riffle, PharmD, BCPS Clinical Pharmacist 02/07/2023 10:17 AM

## 2023-02-07 NOTE — Evaluation (Signed)
Occupational Therapy Evaluation Patient Details Name: Caroline French MRN: [id number] DOB: 07-31-1935 Today's Date: 02/07/2023   History of Present Illness Pt is 88 yo female admitted 02/06/23 with lethargy and AMS found to have CAP.  Pt with hx of dementia, HTN, overactive bladder, and breast CA.   Clinical Impression   P tis at baseline level of function with ADLs and ADL mobility. PTA pt lived at home with her daughter and has 24/7 caregivers. Pt pleasantly confused and cooperative. All education completed, no further acute OT services are indicated at this time     Recommendations for follow up therapy are one component of a multi-disciplinary discharge planning process, led by the attending physician.  Recommendations may be updated based on patient status, additional functional criteria and insurance authorization.   Assistance Recommended at Discharge Frequent or constant Supervision/Assistance  Patient can return home with the following A little help with bathing/dressing/bathroom;A little help with walking and/or transfers;Direct supervision/assist for medications management;Help with stairs or ramp for entrance;Assist for transportation    Functional Status Assessment  Patient has not had a recent decline in their functional status  Equipment Recommendations  None recommended by OT    Recommendations for Other Services       Precautions / Restrictions Precautions Precautions: Fall Restrictions Weight Bearing Restrictions: No      Mobility Bed Mobility               General bed mobility comments: pt in chair upon arrival    Transfers Overall transfer level: Needs assistance Equipment used: Rolling walker (2 wheels) Transfers: Sit to/from Stand Sit to Stand: Mod assist           General transfer comment: Worked on leaning forward and getting feet under body to stand.  Pt with tendency to lean backward to stand.  Required use of momentum and mod A to rise       Balance Overall balance assessment: Needs assistance Sitting-balance support: No upper extremity supported Sitting balance-Leahy Scale: Good     Standing balance support: Bilateral upper extremity supported, Single extremity supported Standing balance-Leahy Scale: Poor                             ADL either performed or assessed with clinical judgement   ADL Overall ADL's : At baseline                                       General ADL Comments: Supervision/min A with ADLs, has 247 assist caregivers at baseline     Vision Ability to See in Adequate Light: 0 Adequate Patient Visual Report: No change from baseline       Perception     Praxis      Pertinent Vitals/Pain Pain Assessment Pain Assessment: No/denies pain     Hand Dominance Right   Extremity/Trunk Assessment Upper Extremity Assessment Upper Extremity Assessment: Overall WFL for tasks assessed   Lower Extremity Assessment Lower Extremity Assessment: Defer to PT evaluation RLE Deficits / Details: ROM WFL; MMT at least 3/5 but not following furhter commands LLE Deficits / Details: ROM WFL; MMT at least 3/5 but not following furhter commands   Cervical / Trunk Assessment Cervical / Trunk Assessment: Kyphotic   Communication Communication Communication: Expressive difficulties   Cognition Arousal/Alertness: Awake/alert Behavior During Therapy: WFL for tasks assessed/performed Overall Cognitive Status:  History of cognitive impairments - at baseline                                 General Comments: Hx of dementia - aid reports slightly more confused with situation compared to baseline     General Comments  Pt  did have some tremors throughout. Pt's aide, [place], was present and reports pt has those at baseline and pt needs assist at baseline.  Did eduated on positioning for transfers and encouraging reaching forward to targets to encourage leaning forward  prior to standing. Spoke with daughter on phone afterward and recommended moving pt downstairs if possible.  She agrees that is in the future.  States that pt has had 2 recent hospitalizations and even SNF stays where she does not mobilize much and does better when returns home.  PT verbalized understanding as is common in dementia.    Exercises     Shoulder Instructions      Home Living Family/patient expects to be discharged to:: Private residence Living Arrangements: Children Available Help at Discharge: Family;Personal care attendant;Available 24 hours/day Type of Home: House Home Access: Stairs to enter [employer] of Steps: 3 Entrance Stairs-Rails: Right Home Layout: Two level;Bed/bath upstairs Alternate Level Stairs-Number of Steps: 14 Alternate Level Stairs-Rails: Right Bathroom Shower/Tub: Producer, television/film/video: Standard     Home Equipment: Agricultural consultant (2 wheels);BSC/3in1   Additional Comments: Bedroom upstairs but aide reports daughter considering moving her downstairs      Prior Functioning/Environment Prior Level of Function : Needs assist             Mobility Comments: Supervision to ambulate in home and able to do stairs with assist; needs assist supine/sit and sit to stand baseline; walks without AD ADLs Comments: Supervision/min A with ADLs, has 247 assist caregivers        OT Problem List: Decreased activity tolerance;Decreased strength;Impaired balance (sitting and/or standing);Decreased safety awareness;Decreased knowledge of use of DME or AE;Decreased cognition      OT Treatment/Interventions:      OT Goals(Current goals can be found in the care plan section) Acute Rehab OT Goals Patient Stated Goal: none stated  OT Frequency:      Co-evaluation              AM-PAC OT "6 Clicks" Daily Activity     Outcome Measure Help from another person eating meals?: A Little (set up) Help from another person taking care of  personal grooming?: A Little Help from another person toileting, which includes using toliet, bedpan, or urinal?: A Little Help from another person bathing (including washing, rinsing, drying)?: A Little Help from another person to put on and taking off regular upper body clothing?: A Little Help from another person to put on and taking off regular lower body clothing?: A Little 6 Click Score: 18   End of Session Equipment Utilized During Treatment: Gait belt;Rolling walker (2 wheels)  Activity Tolerance: Patient tolerated treatment well Patient left: in chair;with call bell/phone within reach;with chair alarm set  OT Visit Diagnosis: Other abnormalities of gait and mobility (R26.89);Muscle weakness (generalized) (M62.81)                Time: 4098-1191 OT Time Calculation (min): 19 min Charges:  OT General Charges $OT Visit: 1 Visit OT Evaluation $OT Eval Low Complexity: 1 Low    Galen Manila 02/07/2023, 2:32 PM

## 2023-02-07 NOTE — Progress Notes (Signed)
Patient is a poor historian and only alert to self. There is no family present at bedside. I will let day RN know to obtain admission history if family arrives today.Caroline French

## 2023-02-07 NOTE — Discharge Summary (Addendum)
Physician Discharge Summary   Patient: Caroline French MRN: [id number] DOB: 1935-02-21  Admit date:     02/06/2023  Discharge date: 02/07/23  Discharge Physician: Brendia Sacks   PCP: Doreene Nest, NP   Recommendations at discharge:   Resolution of pneumonia Continue care for sacral wound present on admission Thrombocytopenia. Mild in nature.  Has been seen intermittently in the past.  Associated with acute infection.  Expect spontaneous recovery.  Discharge Diagnoses: Principal Problem:   CAP (community acquired pneumonia) Active Problems:   Hyperlipidemia   Dementia with behavioral disturbance (HCC)   Gastroesophageal reflux disease Thrombocytopenia  Resolved Problems:   * No resolved hospital problems. *  Hospital Course: 88 year old woman PMH including dementia presented with lethargy and altered mental status.  Admitted for community-acquired pneumonia.  No hypoxia.  Treated with antibiotics with rapid clinical improvement.  Respiratory status stable.  Mentation back at baseline per caregiver at bedside.  Plan discharge home with oral antibiotics.  Discussed with daughter by telephone who agreed.  Already has home health therapy and RN for wound management.  Will reorder on discharge.  Left lower lobe pneumonia associated with Acute metabolic encephalopathy superimposed on Dementia No hypoxia.  Respiratory status stable.  Chest x-ray showed left base infiltrate.  Transition to oral antibiotics and discharged home. Acute metabolic encephalopathy resolved with treatment of pneumonia.  Mental status at baseline per caregiver at bedside.   Thrombocytopenia Mild in nature.  Has been seen intermittently in the past.  Associated with acute infection.  Expect spontaneous recovery. Follow-up as an outpatient.  Dementia with behavioral disturbance (HCC) At baseline at this time.  Caregiver at bedside.  Sacral wound present on admission Appears to be healed/near healed Keep  covered and offload pressure Has HHRN weekly monitoring this  HHPT     Consultants:  None  Procedures performed:  None   Disposition: Home  Diet recommendation:  Resume home diet  DISCHARGE MEDICATION: Allergies as of 02/07/2023       Reactions   Penicillins Rash   Did it involve swelling of the face/tongue/throat, SOB, or low BP? Yes Did it involve sudden or severe rash/hives, skin peeling, or any reaction on the inside of your mouth or nose? Unk Did you need to seek medical attention at a hospital or doctor's office? Unk When did it last happen? "I was young" If all above answers are "NO", may proceed with cephalosporin use.        Medication List     TAKE these medications    Aspirin Low Dose 81 MG tablet Generic drug: aspirin EC TAKE 1 TABLET BY MOUTH ONCE DAILY What changed: how much to take   atorvastatin 40 MG tablet Commonly known as: LIPITOR TAKE 1 TABLET BY MOUTH DAILY FOR CHOLESTEROL What changed:  how much to take how to take this when to take this   Calcium Citrate-Vitamin D3 315-6.25 MG-MCG Tabs Take 1 tablet by mouth daily.   cefadroxil 500 MG capsule Commonly known as: DURICEF Take 1 capsule (500 mg total) by mouth 2 (two) times daily for 3 days. Start 4/30 AM   docusate sodium 100 MG capsule Commonly known as: COLACE Take 200 mg by mouth 2 (two) times daily.   donepezil 10 MG tablet Commonly known as: ARICEPT Take 1 tablet (10 mg total) by mouth at bedtime. For memory.   doxycycline 50 MG capsule Commonly known as: VIBRAMYCIN Take 2 capsules (100 mg total) by mouth 2 (two) times daily for 5 days.  Start 4/30 AM   fluticasone 50 MCG/ACT nasal spray Commonly known as: FLONASE INSTILL 1 SPRAY IN EACH NOSTRIL TWICE DAILY AS NEEDED FOR ALLERGIES What changed: See the new instructions.   hydrOXYzine 10 MG tablet Commonly known as: ATARAX TAKE 1-2 TABLETS (10-20 MG TOTAL) BY MOUTH DAILY AS NEEDED FOR ANXIETY. What changed:  how  much to take when to take this   latanoprost 0.005 % ophthalmic solution Commonly known as: XALATAN Place 1 drop into both eyes at bedtime.   lisinopril 5 MG tablet Commonly known as: ZESTRIL Take 1 tablet (5 mg total) by mouth daily. for blood pressure.   loratadine 10 MG tablet Commonly known as: CLARITIN Take 10 mg by mouth daily.   memantine 28 MG Cp24 24 hr capsule Commonly known as: NAMENDA XR Take 28 mg by mouth daily.   Multi-Vitamins Tabs Take 1 tablet by mouth daily.   omeprazole 20 MG capsule Commonly known as: PRILOSEC Take 1 capsule (20 mg total) by mouth daily. For heartburn   polyethylene glycol powder 17 GM/SCOOP powder Commonly known as: GLYCOLAX/MIRALAX Take 17 g by mouth daily. Pt is  taking at every day   risperiDONE 1 MG tablet Commonly known as: RISPERDAL Take 2 mg by mouth at bedtime.   sertraline 100 MG tablet Commonly known as: ZOLOFT Take 200 mg by mouth daily with supper.   Toviaz 8 MG Tb24 tablet Generic drug: fesoterodine Take 1 tablet (8 mg total) by mouth daily. For overactive bladder               Discharge Care Instructions  (From admission, onward)           Start     Ordered   02/07/23 0000  Discharge wound care:       Comments: Keep clean and dry and covered and offload pressure   02/07/23 1103            Follow-up Information     Doreene Nest, NP. Schedule an appointment as soon as possible for a visit in 1 week(s).   Specialty: Internal Medicine Contact information: [address] [place] [address] [phone]               Feels ok Breathing ok Caregiver at bedside reports pt at baseline  Discharge Exam: Filed Weights   02/06/23 0423  Weight: 79.8 kg   Physical Exam Vitals reviewed.  Constitutional:      General: She is not in acute distress.    Appearance: She is not ill-appearing or toxic-appearing.  Cardiovascular:     Rate and Rhythm: Normal rate and regular  rhythm.     Heart sounds: No murmur heard. Pulmonary:     Effort: Pulmonary effort is normal. No respiratory distress.     Breath sounds: No wheezing, rhonchi or rales.  Neurological:     Mental Status: She is alert.     Comments: Alert, answers simple questions appropriately  Psychiatric:        Mood and Affect: Mood normal.        Behavior: Behavior normal.      Condition at discharge: good  The results of significant diagnostics from this hospitalization (including imaging, microbiology, ancillary and laboratory) are listed below for reference.   Imaging Studies: DG Chest Port 1 View  Result Date: 02/06/2023 CLINICAL DATA:  Altered mental status.  Question ule sepsis. EXAM: PORTABLE CHEST 1 VIEW COMPARISON:  09/07/2022 FINDINGS: Lung apices obscured by the patient's  head. Right lung clear. There is left base atelectasis/infiltrate. Cardiopericardial silhouette is at upper limits of normal for size. Thoracolumbar scoliosis evident with osteopenia of the visualized bony anatomy. Telemetry leads overlie the chest. IMPRESSION: Left base atelectasis/infiltrate. Electronically Signed   By: Kennith Center M.D.   On: 02/06/2023 06:23   MM 3D SCREEN BREAST BILATERAL  Result Date: 02/02/2023 CLINICAL DATA:  Screening. EXAM: DIGITAL SCREENING BILATERAL MAMMOGRAM WITH TOMOSYNTHESIS AND CAD TECHNIQUE: Bilateral screening digital craniocaudal and mediolateral oblique mammograms were obtained. Bilateral screening digital breast tomosynthesis was performed. The images were evaluated with computer-aided detection. COMPARISON:  Previous exam(s). ACR Breast Density Category b: There are scattered areas of fibroglandular density. FINDINGS: In the left breast, calcifications warrant further evaluation. In the right breast, no findings suspicious for malignancy. Images are limited particularly of the mid to posterior portions of the left breast at the patient debilitated and wheelchair-bound. IMPRESSION:  Further evaluation is suggested for calcifications in the left breast. RECOMMENDATION: Diagnostic mammogram of the left breast. (Code:[id number]) The patient will be contacted regarding the findings, and additional imaging will be scheduled. BI-RADS CATEGORY  0: Incomplete: Need additional imaging evaluation. Electronically Signed   By: Edwin Cap M.D.   On: 02/02/2023 08:42   US Venous Img Lower Unilateral Left (DVT)  Result Date: 01/27/2023 CLINICAL DATA:  88 year old female with left lower extremity swelling and pain for 2 days. EXAM: LEFT LOWER EXTREMITY VENOUS DOPPLER ULTRASOUND TECHNIQUE: Gray-scale sonography with compression, as well as color and duplex ultrasound, were performed to evaluate the deep venous system(s) from the level of the common femoral vein through the popliteal and proximal calf veins. COMPARISON:  None Available. FINDINGS: VENOUS Normal compressibility of the common femoral, superficial femoral, and popliteal veins, as well as the visualized calf veins. Visualized portions of profunda femoral vein and great saphenous vein unremarkable. No filling defects to suggest DVT on grayscale or color Doppler imaging. Doppler waveforms show normal direction of venous flow, normal respiratory plasticity and response to augmentation. Limited views of the contralateral common femoral vein are unremarkable. OTHER None. Limitations: none IMPRESSION: No evidence of left lower extremity deep venous thrombosis. Electronically Signed   By: Odessa Fleming M.D.   On: 01/27/2023 10:59    Microbiology: Results for orders placed or performed during the hospital encounter of 02/06/23  Blood Culture (routine x 2)     Status: None (Preliminary result)   Collection Time: 02/06/23  4:49 AM   Specimen: BLOOD  Result Value Ref Range Status   Specimen Description   Final    BLOOD SITE NOT SPECIFIED Performed at [facility], [address] [place], [place] [address]    Special Requests    Final    BOTTLES DRAWN AEROBIC AND ANAEROBIC Blood Culture adequate volume Performed at [facility], [address], [place], [place] [address]    Culture   Final    NO GROWTH < 24 HOURS Performed at [facility], [address] [place], [place] [address]    Report Status PENDING  Incomplete  Blood Culture (routine x 2)     Status: None (Preliminary result)   Collection Time: 02/06/23  6:00 AM   Specimen: BLOOD  Result Value Ref Range Status   Specimen Description   Final    BLOOD SITE NOT SPECIFIED Performed at [facility], [address], [place], [place] [address]    Special Requests   Final    BOTTLES DRAWN AEROBIC  AND ANAEROBIC Blood Culture results may not be optimal due to an excessive volume of blood received in culture bottles Performed at [facility], [address] [place], [place] [address]    Culture   Final    NO GROWTH < 24 HOURS Performed at [facility], [address] [place], [place] [address]    Report Status PENDING  Incomplete  SARS Coronavirus 2 by RT PCR (hospital order, performed in [facility] hospital lab) *cepheid single result test* Anterior Nasal Swab     Status: None   Collection Time: 02/06/23  6:15 AM   Specimen: Anterior Nasal Swab  Result Value Ref Range Status   SARS Coronavirus 2 by RT PCR NEGATIVE NEGATIVE Final    Comment: (NOTE) SARS-CoV-2 target nucleic acids are NOT DETECTED.  The SARS-CoV-2 RNA is generally detectable in upper and lower respiratory specimens during the acute phase of infection. The lowest concentration of SARS-CoV-2 viral copies this assay can detect is 250 copies / mL. A negative result does not preclude SARS-CoV-2 infection and should not be used as the sole basis for treatment or other patient management decisions.  A negative result may occur with improper specimen collection / handling, submission of specimen other than nasopharyngeal swab, presence  of viral mutation(s) within the areas targeted by this assay, and inadequate number of viral copies (<250 copies / mL). A negative result must be combined with clinical observations, patient history, and epidemiological information.  Fact Sheet for Patients:   RoadLapTop.co.za  Fact Sheet for Healthcare Providers: http://kim-miller.com/  This test is not yet approved or  cleared by the [place] FDA and has been authorized for detection and/or diagnosis of SARS-CoV-2 by FDA under an Emergency Use Authorization (EUA).  This EUA will remain in effect (meaning this test can be used) for the duration of the COVID-19 declaration under Section 564(b)(1) of the Act, 21 U.S.C. section 360bbb-3(b)(1), unless the authorization is terminated or revoked sooner.  Performed at [facility], [address] [place], [place] [address]     Labs: CBC: Recent Labs  Lab 02/06/23 0449  WBC 6.6  NEUTROABS 5.9  HGB 12.3  HCT 37.9  MCV 93.8  PLT 120*   Basic Metabolic Panel: Recent Labs  Lab 02/06/23 0449  NA 135  K 3.4*  CL 101  CO2 22  GLUCOSE 130*  BUN 14  CREATININE 1.09*  CALCIUM 8.4*   Liver Function Tests: Recent Labs  Lab 02/06/23 0449  AST 26  ALT 22  ALKPHOS 61  BILITOT 0.9  PROT 6.7  ALBUMIN 3.4*   CBG: No results for input(s): "GLUCAP" in the last 168 hours.  Discharge time spent: greater than 30 minutes.  Signed: Brendia Sacks, MD [facility] 02/07/2023

## 2023-02-07 NOTE — Hospital Course (Addendum)
88 year old woman PMH including dementia presented with lethargy and altered mental status.  Admitted for community-acquired pneumonia.  No hypoxia.  Treated with antibiotics with rapid clinical improvement.  Respiratory status stable.  Mentation back at baseline per caregiver at bedside.  Plan discharge home with oral antibiotics.

## 2023-02-07 NOTE — Care Management Obs Status (Signed)
MEDICARE OBSERVATION STATUS NOTIFICATION   Patient Details  Name: Caroline French MRN: [id number] Date of Birth: 06/11/1935   Medicare Observation Status Notification Given:  Yes    Beckie Busing, RN 02/07/2023, 12:12 PM

## 2023-02-08 LAB — CULTURE, BLOOD (ROUTINE X 2)

## 2023-02-09 DIAGNOSIS — G4733 Obstructive sleep apnea (adult) (pediatric): Secondary | ICD-10-CM | POA: Diagnosis not present

## 2023-02-09 DIAGNOSIS — G309 Alzheimer's disease, unspecified: Secondary | ICD-10-CM | POA: Diagnosis not present

## 2023-02-09 DIAGNOSIS — I10 Essential (primary) hypertension: Secondary | ICD-10-CM | POA: Diagnosis not present

## 2023-02-09 DIAGNOSIS — N179 Acute kidney failure, unspecified: Secondary | ICD-10-CM | POA: Diagnosis not present

## 2023-02-09 DIAGNOSIS — L89152 Pressure ulcer of sacral region, stage 2: Secondary | ICD-10-CM | POA: Diagnosis not present

## 2023-02-09 DIAGNOSIS — F0282 Dementia in other diseases classified elsewhere, unspecified severity, with psychotic disturbance: Secondary | ICD-10-CM | POA: Diagnosis not present

## 2023-02-10 DIAGNOSIS — G309 Alzheimer's disease, unspecified: Secondary | ICD-10-CM | POA: Diagnosis not present

## 2023-02-10 DIAGNOSIS — F0282 Dementia in other diseases classified elsewhere, unspecified severity, with psychotic disturbance: Secondary | ICD-10-CM | POA: Diagnosis not present

## 2023-02-10 DIAGNOSIS — L89152 Pressure ulcer of sacral region, stage 2: Secondary | ICD-10-CM | POA: Diagnosis not present

## 2023-02-10 DIAGNOSIS — I10 Essential (primary) hypertension: Secondary | ICD-10-CM | POA: Diagnosis not present

## 2023-02-10 DIAGNOSIS — G4733 Obstructive sleep apnea (adult) (pediatric): Secondary | ICD-10-CM | POA: Diagnosis not present

## 2023-02-10 DIAGNOSIS — N179 Acute kidney failure, unspecified: Secondary | ICD-10-CM | POA: Diagnosis not present

## 2023-02-10 LAB — CULTURE, BLOOD (ROUTINE X 2): Special Requests: ADEQUATE

## 2023-02-11 LAB — CULTURE, BLOOD (ROUTINE X 2): Culture: NO GROWTH

## 2023-02-14 DIAGNOSIS — K5900 Constipation, unspecified: Secondary | ICD-10-CM | POA: Diagnosis not present

## 2023-02-14 DIAGNOSIS — J189 Pneumonia, unspecified organism: Secondary | ICD-10-CM | POA: Diagnosis not present

## 2023-02-14 DIAGNOSIS — Z79899 Other long term (current) drug therapy: Secondary | ICD-10-CM | POA: Diagnosis not present

## 2023-02-14 DIAGNOSIS — H409 Unspecified glaucoma: Secondary | ICD-10-CM | POA: Diagnosis not present

## 2023-02-14 DIAGNOSIS — I10 Essential (primary) hypertension: Secondary | ICD-10-CM | POA: Diagnosis not present

## 2023-02-14 DIAGNOSIS — R7303 Prediabetes: Secondary | ICD-10-CM | POA: Diagnosis not present

## 2023-02-14 DIAGNOSIS — F0284 Dementia in other diseases classified elsewhere, unspecified severity, with anxiety: Secondary | ICD-10-CM | POA: Diagnosis not present

## 2023-02-14 DIAGNOSIS — R443 Hallucinations, unspecified: Secondary | ICD-10-CM | POA: Diagnosis not present

## 2023-02-14 DIAGNOSIS — K219 Gastro-esophageal reflux disease without esophagitis: Secondary | ICD-10-CM | POA: Diagnosis not present

## 2023-02-14 DIAGNOSIS — Z853 Personal history of malignant neoplasm of breast: Secondary | ICD-10-CM | POA: Diagnosis not present

## 2023-02-14 DIAGNOSIS — G309 Alzheimer's disease, unspecified: Secondary | ICD-10-CM | POA: Diagnosis not present

## 2023-02-14 DIAGNOSIS — R3915 Urgency of urination: Secondary | ICD-10-CM | POA: Diagnosis not present

## 2023-02-14 DIAGNOSIS — F0282 Dementia in other diseases classified elsewhere, unspecified severity, with psychotic disturbance: Secondary | ICD-10-CM | POA: Diagnosis not present

## 2023-02-14 DIAGNOSIS — N3281 Overactive bladder: Secondary | ICD-10-CM | POA: Diagnosis not present

## 2023-02-14 DIAGNOSIS — G4733 Obstructive sleep apnea (adult) (pediatric): Secondary | ICD-10-CM | POA: Diagnosis not present

## 2023-02-14 DIAGNOSIS — E785 Hyperlipidemia, unspecified: Secondary | ICD-10-CM | POA: Diagnosis not present

## 2023-02-14 DIAGNOSIS — K5730 Diverticulosis of large intestine without perforation or abscess without bleeding: Secondary | ICD-10-CM | POA: Diagnosis not present

## 2023-02-14 DIAGNOSIS — Z9181 History of falling: Secondary | ICD-10-CM | POA: Diagnosis not present

## 2023-02-15 ENCOUNTER — Telehealth: Payer: Self-pay | Admitting: Primary Care

## 2023-02-15 NOTE — Telephone Encounter (Signed)
[facility] faxed over Home Health Certificate (Order [place] [id number]), to be filled out by provider. Document is located in providers S-drive. Thank you!

## 2023-02-16 NOTE — Telephone Encounter (Signed)
Printed and placed in [place] box for review and completion

## 2023-02-17 ENCOUNTER — Encounter: Payer: Self-pay | Admitting: Primary Care

## 2023-02-17 ENCOUNTER — Ambulatory Visit (INDEPENDENT_AMBULATORY_CARE_PROVIDER_SITE_OTHER): Payer: Medicare Other | Admitting: Primary Care

## 2023-02-17 VITALS — BP 136/68 | HR 70 | Temp 97.5°F | Ht 67.0 in | Wt 163.0 lb

## 2023-02-17 DIAGNOSIS — G309 Alzheimer's disease, unspecified: Secondary | ICD-10-CM | POA: Diagnosis not present

## 2023-02-17 DIAGNOSIS — K5909 Other constipation: Secondary | ICD-10-CM | POA: Diagnosis not present

## 2023-02-17 DIAGNOSIS — N3281 Overactive bladder: Secondary | ICD-10-CM | POA: Diagnosis not present

## 2023-02-17 DIAGNOSIS — I10 Essential (primary) hypertension: Secondary | ICD-10-CM | POA: Diagnosis not present

## 2023-02-17 DIAGNOSIS — J189 Pneumonia, unspecified organism: Secondary | ICD-10-CM | POA: Diagnosis not present

## 2023-02-17 DIAGNOSIS — R443 Hallucinations, unspecified: Secondary | ICD-10-CM | POA: Diagnosis not present

## 2023-02-17 DIAGNOSIS — F0282 Dementia in other diseases classified elsewhere, unspecified severity, with psychotic disturbance: Secondary | ICD-10-CM | POA: Diagnosis not present

## 2023-02-17 DIAGNOSIS — F0284 Dementia in other diseases classified elsewhere, unspecified severity, with anxiety: Secondary | ICD-10-CM | POA: Diagnosis not present

## 2023-02-17 NOTE — Patient Instructions (Signed)
Use your spirometry as instructed.  Continue Benefiber for bowel movements.  Remain off Toviaz as this is too expensive. Let me know if you'd like to try another treatment.   It was a pleasure to see you today!

## 2023-02-17 NOTE — Assessment & Plan Note (Signed)
Recent hospital stay. Hospital notes, labs, imaging reviewed.  Exam today is back to baseline.  Continue spirometry. Return precautions provided.

## 2023-02-17 NOTE — Telephone Encounter (Signed)
Completed and placed in Kelli's inbox. 

## 2023-02-17 NOTE — Progress Notes (Signed)
Subjective:    Patient ID: Caroline French, female    DOB: 1935/10/11, 88 y.o.   MRN: [id number]  HPI  Caroline French is a very pleasant 88 y.o. female with a history of dementia with behavioral disturbance, hypertension, hyperlipidemia, prediabetes, altered mental status, generalized weakness who presents today for hospital follow-up.  Her daughter accompanies her today who provides information for HPI.  She presented to [facility] long ED on 02/06/2023 with altered mental status, decreased oral intake, decreased urinary output.  During her stay in the ED she underwent EKG which was without acute changes.  Laboratory results revealed negative COVID-19 test, CBC without anemia or leukocytosis, mild renal insufficiency.  Her chest x-ray showed possible pneumonia and urinalysis was suspicious for UTI.  She was admitted for further treatment.  During her hospital stay she received IV antibiotics and rehydration.  Her sacral wound was also evaluated by wound nursing team.  Symptoms improved per baseline.  She was discharged home the following day with prescriptions for Duricef 500 mg twice daily x 3 days, doxycycline 100 mg twice daily x 5 days.  Since her discharge home she has completed the oral antibiotics. She is feeling better and her daughter mentions she is back to baseline. Her sacral wound has nearly healed. She is compliant to her spirometry. She is not coughing from her infection. No fevers. Her bowels are moving much better since switching to Benefiber.   Also, Gala Murdoch has become cost prohibitive and is not effective. Daughter would like to discontinue.    Review of Systems  Constitutional:  Negative for fever.  Respiratory:  Negative for cough and shortness of breath.   Cardiovascular:  Negative for chest pain.         Past Medical History:  Diagnosis Date   Breast cancer [facility]) 1992's   left breast   Dementia with behavioral disturbance (HCC)    Diverticulitis    Essential  hypertension    Glaucoma    Hallucinations    Overactive bladder     Social History   Socioeconomic History   Marital status: Widowed    Spouse name: Not on file   Number of children: Not on file   Years of education: Not on file   Highest education level: Not on file  Occupational History   Not on file  Tobacco Use   Smoking status: Never   Smokeless tobacco: Never  Vaping Use   Vaping Use: Never used  Substance and Sexual Activity   Alcohol use: Never   Drug use: Never   Sexual activity: Not Currently  Other Topics Concern   Not on file  Social History Narrative   Widow.   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   Social Determinants of Health   Financial Resource Strain: Low Risk  (03/30/2022)   Overall Financial Resource Strain (CARDIA)    Difficulty of Paying Living Expenses: Not hard at all  Food Insecurity: No Food Insecurity (03/30/2022)   Hunger Vital Sign    Worried About Running Out of Food in the Last Year: Never true    Ran Out of Food in the Last Year: Never true  Transportation Needs: No Transportation Needs (03/30/2022)   PRAPARE - Administrator, Civil Service (Medical): No    Lack of Transportation (Non-Medical): No  Physical Activity: Insufficiently Active (03/30/2022)   Exercise Vital Sign    Days of Exercise per Week: 1 day    Minutes of Exercise  per Session: 60 min  Stress: No Stress Concern Present (03/30/2022)   [employer] of Occupational Health - Occupational Stress Questionnaire    Feeling of Stress : Not at all  Social Connections: Socially Integrated (03/30/2022)   Social Connection and Isolation Panel [NHANES]    Frequency of Communication with Friends and Family: More than three times a week    Frequency of Social Gatherings with Friends and Family: More than three times a week    Attends Religious Services: More than 4 times per year    Active Member of [employer] or Organizations: Yes    Attends Museum/gallery exhibitions officer: More than 4 times per year    Marital Status: Married  Catering manager Violence: Not At Risk (03/30/2022)   Humiliation, Afraid, Rape, and Kick questionnaire    Fear of Current or Ex-Partner: No    Emotionally Abused: No    Physically Abused: No    Sexually Abused: No    Past Surgical History:  Procedure Laterality Date   ABDOMINAL HYSTERECTOMY     BREAST BIOPSY     BREAST LUMPECTOMY Left 1990's   CATARACT EXTRACTION, BILATERAL  08/012020    No family history on file.  Allergies  Allergen Reactions   Penicillins Rash    Did it involve swelling of the face/tongue/throat, SOB, or low BP? Yes Did it involve sudden or severe rash/hives, skin peeling, or any reaction on the inside of your mouth or nose? Unk Did you need to seek medical attention at a hospital or doctor's office? Unk When did it last happen? "I was young" If all above answers are "NO", may proceed with cephalosporin use.     Current Outpatient Medications on File Prior to Visit  Medication Sig Dispense Refill   aspirin (ASPIRIN LOW DOSE) 81 MG EC tablet TAKE 1 TABLET BY MOUTH ONCE DAILY (Patient taking differently: Take 81 mg by mouth daily.) 90 tablet 3   atorvastatin (LIPITOR) 40 MG tablet TAKE 1 TABLET BY MOUTH DAILY FOR CHOLESTEROL (Patient taking differently: Take 40 mg by mouth See admin instructions. TAKE 1 TABLET BY MOUTH DAILY FOR CHOLESTEROL) 90 tablet 3   Calcium Citrate-Vitamin D3 315-6.25 MG-MCG TABS Take 1 tablet by mouth daily.     donepezil (ARICEPT) 10 MG tablet Take 1 tablet (10 mg total) by mouth at bedtime. For memory. 90 tablet 3   fluticasone (FLONASE) 50 MCG/ACT nasal spray INSTILL 1 SPRAY IN EACH NOSTRIL TWICE DAILY AS NEEDED FOR ALLERGIES (Patient taking differently: Place 1 spray into both nostrils 2 (two) times daily as needed for allergies.) 48 g 1   hydrOXYzine (ATARAX) 10 MG tablet TAKE 1-2 TABLETS (10-20 MG TOTAL) BY MOUTH DAILY AS NEEDED FOR ANXIETY. (Patient  taking differently: Take 20 mg by mouth at bedtime.) 180 tablet 0   latanoprost (XALATAN) 0.005 % ophthalmic solution Place 1 drop into both eyes at bedtime.     lisinopril (ZESTRIL) 5 MG tablet Take 1 tablet (5 mg total) by mouth daily. for blood pressure. 90 tablet 3   loratadine (CLARITIN) 10 MG tablet Take 10 mg by mouth daily.     memantine (NAMENDA XR) 28 MG CP24 24 hr capsule Take 28 mg by mouth daily.     Multiple Vitamin (MULTI-VITAMINS) TABS Take 1 tablet by mouth daily.  11   omeprazole (PRILOSEC) 20 MG capsule Take 1 capsule (20 mg total) by mouth daily. For heartburn 90 capsule 3   polyethylene glycol powder (GLYCOLAX/MIRALAX) 17 GM/SCOOP powder Take  17 g by mouth daily. Pt is  taking at every day     risperiDONE (RISPERDAL) 1 MG tablet Take 2 mg by mouth at bedtime.     sertraline (ZOLOFT) 100 MG tablet Take 200 mg by mouth daily with supper.     docusate sodium (COLACE) 100 MG capsule Take 200 mg by mouth 2 (two) times daily. (Patient not taking: Reported on 02/17/2023)     No current facility-administered medications on file prior to visit.    BP 136/68   Pulse 70   Temp (!) 97.5 F (36.4 C) (Temporal)   Ht 5\' 7"  (1.702 m)   Wt 163 lb (73.9 kg)   SpO2 97%   BMI 25.53 kg/m  Objective:   Physical Exam Cardiovascular:     Rate and Rhythm: Normal rate and regular rhythm.  Pulmonary:     Effort: Pulmonary effort is normal.     Breath sounds: Normal breath sounds.  Musculoskeletal:     Cervical back: Neck supple.  Skin:    General: Skin is warm and dry.  Neurological:     Mental Status: She is alert.           Assessment & Plan:  Community acquired pneumonia of left lung, unspecified part of lung Assessment & Plan: Recent hospital stay. Hospital notes, labs, imaging reviewed.  Exam today is back to baseline.  Continue spirometry. Return precautions provided.    Chronic constipation Assessment & Plan: Improved.  Continue Benefiber.    Overactive  bladder Assessment & Plan: Discontinue.  She declines other treatment options at this time. She has failed several previously.  Her daughter will notify if they would like to pursue treatment.         Doreene Nest, NP

## 2023-02-17 NOTE — Assessment & Plan Note (Signed)
Discontinue.  She declines other treatment options at this time. She has failed several previously.  Her daughter will notify if they would like to pursue treatment.

## 2023-02-17 NOTE — Assessment & Plan Note (Signed)
Improved.  Continue Benefiber.

## 2023-02-18 NOTE — Telephone Encounter (Signed)
Forms complete and faxed to [facility].

## 2023-02-21 DIAGNOSIS — F0284 Dementia in other diseases classified elsewhere, unspecified severity, with anxiety: Secondary | ICD-10-CM | POA: Diagnosis not present

## 2023-02-21 DIAGNOSIS — F0282 Dementia in other diseases classified elsewhere, unspecified severity, with psychotic disturbance: Secondary | ICD-10-CM | POA: Diagnosis not present

## 2023-02-21 DIAGNOSIS — R443 Hallucinations, unspecified: Secondary | ICD-10-CM | POA: Diagnosis not present

## 2023-02-21 DIAGNOSIS — G309 Alzheimer's disease, unspecified: Secondary | ICD-10-CM | POA: Diagnosis not present

## 2023-02-21 DIAGNOSIS — J189 Pneumonia, unspecified organism: Secondary | ICD-10-CM | POA: Diagnosis not present

## 2023-02-21 DIAGNOSIS — I10 Essential (primary) hypertension: Secondary | ICD-10-CM | POA: Diagnosis not present

## 2023-02-27 ENCOUNTER — Other Ambulatory Visit: Payer: Self-pay | Admitting: Primary Care

## 2023-02-27 DIAGNOSIS — F03918 Unspecified dementia, unspecified severity, with other behavioral disturbance: Secondary | ICD-10-CM

## 2023-02-27 DIAGNOSIS — F418 Other specified anxiety disorders: Secondary | ICD-10-CM

## 2023-02-28 DIAGNOSIS — H401133 Primary open-angle glaucoma, bilateral, severe stage: Secondary | ICD-10-CM | POA: Diagnosis not present

## 2023-03-01 ENCOUNTER — Telehealth: Payer: Self-pay | Admitting: Primary Care

## 2023-03-01 DIAGNOSIS — R443 Hallucinations, unspecified: Secondary | ICD-10-CM | POA: Diagnosis not present

## 2023-03-01 DIAGNOSIS — F0284 Dementia in other diseases classified elsewhere, unspecified severity, with anxiety: Secondary | ICD-10-CM | POA: Diagnosis not present

## 2023-03-01 DIAGNOSIS — G309 Alzheimer's disease, unspecified: Secondary | ICD-10-CM | POA: Diagnosis not present

## 2023-03-01 DIAGNOSIS — I10 Essential (primary) hypertension: Secondary | ICD-10-CM | POA: Diagnosis not present

## 2023-03-01 DIAGNOSIS — J189 Pneumonia, unspecified organism: Secondary | ICD-10-CM | POA: Diagnosis not present

## 2023-03-01 DIAGNOSIS — F0282 Dementia in other diseases classified elsewhere, unspecified severity, with psychotic disturbance: Secondary | ICD-10-CM | POA: Diagnosis not present

## 2023-03-01 NOTE — Telephone Encounter (Signed)
Noted  

## 2023-03-01 NOTE — Telephone Encounter (Signed)
Princess from [facility] called over and stated that daughter reported patient had a fall when getting up from a chair. She stated that there was no injuries and they will continue PT. Thank you!

## 2023-03-10 DIAGNOSIS — F0282 Dementia in other diseases classified elsewhere, unspecified severity, with psychotic disturbance: Secondary | ICD-10-CM | POA: Diagnosis not present

## 2023-03-10 DIAGNOSIS — J189 Pneumonia, unspecified organism: Secondary | ICD-10-CM | POA: Diagnosis not present

## 2023-03-10 DIAGNOSIS — R443 Hallucinations, unspecified: Secondary | ICD-10-CM | POA: Diagnosis not present

## 2023-03-10 DIAGNOSIS — I10 Essential (primary) hypertension: Secondary | ICD-10-CM | POA: Diagnosis not present

## 2023-03-10 DIAGNOSIS — G309 Alzheimer's disease, unspecified: Secondary | ICD-10-CM | POA: Diagnosis not present

## 2023-03-10 DIAGNOSIS — F0284 Dementia in other diseases classified elsewhere, unspecified severity, with anxiety: Secondary | ICD-10-CM | POA: Diagnosis not present

## 2023-03-16 ENCOUNTER — Other Ambulatory Visit: Payer: Self-pay | Admitting: Primary Care

## 2023-03-16 DIAGNOSIS — I10 Essential (primary) hypertension: Secondary | ICD-10-CM

## 2023-03-16 DIAGNOSIS — F0284 Dementia in other diseases classified elsewhere, unspecified severity, with anxiety: Secondary | ICD-10-CM | POA: Diagnosis not present

## 2023-03-16 DIAGNOSIS — N3281 Overactive bladder: Secondary | ICD-10-CM | POA: Diagnosis not present

## 2023-03-16 DIAGNOSIS — G309 Alzheimer's disease, unspecified: Secondary | ICD-10-CM | POA: Diagnosis not present

## 2023-03-16 DIAGNOSIS — Z9181 History of falling: Secondary | ICD-10-CM | POA: Diagnosis not present

## 2023-03-16 DIAGNOSIS — H409 Unspecified glaucoma: Secondary | ICD-10-CM | POA: Diagnosis not present

## 2023-03-16 DIAGNOSIS — K219 Gastro-esophageal reflux disease without esophagitis: Secondary | ICD-10-CM

## 2023-03-16 DIAGNOSIS — F0282 Dementia in other diseases classified elsewhere, unspecified severity, with psychotic disturbance: Secondary | ICD-10-CM | POA: Diagnosis not present

## 2023-03-16 DIAGNOSIS — Z79899 Other long term (current) drug therapy: Secondary | ICD-10-CM | POA: Diagnosis not present

## 2023-03-16 DIAGNOSIS — K5900 Constipation, unspecified: Secondary | ICD-10-CM | POA: Diagnosis not present

## 2023-03-16 DIAGNOSIS — K5730 Diverticulosis of large intestine without perforation or abscess without bleeding: Secondary | ICD-10-CM | POA: Diagnosis not present

## 2023-03-16 DIAGNOSIS — R443 Hallucinations, unspecified: Secondary | ICD-10-CM | POA: Diagnosis not present

## 2023-03-16 DIAGNOSIS — R3915 Urgency of urination: Secondary | ICD-10-CM | POA: Diagnosis not present

## 2023-03-16 DIAGNOSIS — G4733 Obstructive sleep apnea (adult) (pediatric): Secondary | ICD-10-CM | POA: Diagnosis not present

## 2023-03-16 DIAGNOSIS — J189 Pneumonia, unspecified organism: Secondary | ICD-10-CM | POA: Diagnosis not present

## 2023-03-16 DIAGNOSIS — E785 Hyperlipidemia, unspecified: Secondary | ICD-10-CM

## 2023-03-16 DIAGNOSIS — Z853 Personal history of malignant neoplasm of breast: Secondary | ICD-10-CM | POA: Diagnosis not present

## 2023-03-16 DIAGNOSIS — R7303 Prediabetes: Secondary | ICD-10-CM | POA: Diagnosis not present

## 2023-03-24 DIAGNOSIS — J189 Pneumonia, unspecified organism: Secondary | ICD-10-CM | POA: Diagnosis not present

## 2023-03-24 DIAGNOSIS — F0284 Dementia in other diseases classified elsewhere, unspecified severity, with anxiety: Secondary | ICD-10-CM | POA: Diagnosis not present

## 2023-03-24 DIAGNOSIS — F0282 Dementia in other diseases classified elsewhere, unspecified severity, with psychotic disturbance: Secondary | ICD-10-CM | POA: Diagnosis not present

## 2023-03-24 DIAGNOSIS — R443 Hallucinations, unspecified: Secondary | ICD-10-CM | POA: Diagnosis not present

## 2023-03-24 DIAGNOSIS — G309 Alzheimer's disease, unspecified: Secondary | ICD-10-CM | POA: Diagnosis not present

## 2023-03-24 DIAGNOSIS — I10 Essential (primary) hypertension: Secondary | ICD-10-CM | POA: Diagnosis not present

## 2023-03-29 DIAGNOSIS — G309 Alzheimer's disease, unspecified: Secondary | ICD-10-CM | POA: Diagnosis not present

## 2023-03-29 DIAGNOSIS — J189 Pneumonia, unspecified organism: Secondary | ICD-10-CM | POA: Diagnosis not present

## 2023-03-29 DIAGNOSIS — F0282 Dementia in other diseases classified elsewhere, unspecified severity, with psychotic disturbance: Secondary | ICD-10-CM | POA: Diagnosis not present

## 2023-03-29 DIAGNOSIS — F0284 Dementia in other diseases classified elsewhere, unspecified severity, with anxiety: Secondary | ICD-10-CM | POA: Diagnosis not present

## 2023-03-29 DIAGNOSIS — R443 Hallucinations, unspecified: Secondary | ICD-10-CM | POA: Diagnosis not present

## 2023-03-29 DIAGNOSIS — I10 Essential (primary) hypertension: Secondary | ICD-10-CM | POA: Diagnosis not present

## 2023-04-04 ENCOUNTER — Ambulatory Visit (INDEPENDENT_AMBULATORY_CARE_PROVIDER_SITE_OTHER): Payer: Medicare Other

## 2023-04-04 VITALS — Ht 68.0 in | Wt 180.0 lb

## 2023-04-04 DIAGNOSIS — Z0000 Encounter for general adult medical examination without abnormal findings: Secondary | ICD-10-CM | POA: Diagnosis not present

## 2023-04-04 NOTE — Progress Notes (Signed)
Subjective:   Caroline French is a 88 y.o. female who presents for Medicare Annual (Subsequent) preventive examination.  Visit Complete: Virtual  I connected with  Caroline French and her daughter  Caroline French on 04/04/23 by a audio enabled telemedicine application and verified that I am speaking with the correct person using two identifiers.  Patient Location: Home  Provider Location: Home Office  I discussed the limitations of evaluation and management by telemedicine. The patient expressed understanding and agreed to proceed.   Review of Systems      Cardiac Risk Factors include: advanced age (>20men, >57 women);hypertension;dyslipidemia;sedentary lifestyle     Objective:    Today's Vitals   04/04/23 1111  Weight: 180 lb (81.6 kg)  Height: 5\' 8"  (1.727 m)   Body mass index is 27.37 kg/m.     04/04/2023   11:21 AM 09/07/2022    3:18 PM 03/30/2022   11:19 AM 12/22/2021    9:08 PM 03/27/2021   11:48 AM 03/04/2020    9:47 AM 01/24/2019    2:48 PM  Advanced Directives  Does Patient Have a Medical Advance Directive? No No Yes No No No No  Type of Surveyor, minerals;Living will      Copy of Healthcare Power of Attorney in Chart?   No - copy requested      Would patient like information on creating a medical advance directive? No - Patient declined   No - Patient declined No - Patient declined Yes (MAU/Ambulatory/Procedural Areas - Information given) No - Patient declined    Current Medications (verified) Outpatient Encounter Medications as of 04/04/2023  Medication Sig   aspirin (ASPIRIN LOW DOSE) 81 MG EC tablet TAKE 1 TABLET BY MOUTH ONCE DAILY (Patient taking differently: Take 81 mg by mouth daily.)   atorvastatin (LIPITOR) 40 MG tablet TAKE 1 TABLET BY MOUTH EVERY DAY FOR CHOLESTEROL   Calcium Citrate-Vitamin D3 315-6.25 MG-MCG TABS Take 1 tablet by mouth daily.   donepezil (ARICEPT) 10 MG tablet Take 1 tablet (10 mg total) by mouth at bedtime. For  memory.   fluticasone (FLONASE) 50 MCG/ACT nasal spray INSTILL 1 SPRAY IN EACH NOSTRIL TWICE DAILY AS NEEDED FOR ALLERGIES (Patient taking differently: Place 1 spray into both nostrils 2 (two) times daily as needed for allergies.)   hydrOXYzine (ATARAX) 10 MG tablet Take 2 tablets (20 mg total) by mouth at bedtime as needed for anxiety.   latanoprost (XALATAN) 0.005 % ophthalmic solution Place 1 drop into both eyes at bedtime.   lisinopril (ZESTRIL) 5 MG tablet TAKE 1 TABLET BY MOUTH EVERY DAY FOR BLOOD PRESSURE   loratadine (CLARITIN) 10 MG tablet Take 10 mg by mouth daily.   memantine (NAMENDA XR) 28 MG CP24 24 hr capsule Take 28 mg by mouth daily.   Multiple Vitamin (MULTI-VITAMINS) TABS Take 1 tablet by mouth daily.   omeprazole (PRILOSEC) 20 MG capsule TAKE 1 CAPSULE (20 MG TOTAL) BY MOUTH DAILY. FOR HEARTBURN   polyethylene glycol powder (GLYCOLAX/MIRALAX) 17 GM/SCOOP powder Take 17 g by mouth daily. Pt is  taking at every day   risperiDONE (RISPERDAL) 1 MG tablet Take 2 mg by mouth at bedtime.   sertraline (ZOLOFT) 100 MG tablet Take 200 mg by mouth daily with supper.   docusate sodium (COLACE) 100 MG capsule Take 200 mg by mouth 2 (two) times daily. (Patient not taking: Reported on 02/17/2023)   No facility-administered encounter medications on file as of 04/04/2023.  Allergies (verified) Penicillins   History: Past Medical History:  Diagnosis Date   Breast cancer (HCC) 1992's   left breast   Dementia with behavioral disturbance (HCC)    Diverticulitis    Essential hypertension    Glaucoma    Hallucinations    Overactive bladder    Past Surgical History:  Procedure Laterality Date   ABDOMINAL HYSTERECTOMY     BREAST BIOPSY     BREAST LUMPECTOMY Left 1990's   CATARACT EXTRACTION, BILATERAL  08/012020   History reviewed. No pertinent family history. Social History   Socioeconomic History   Marital status: Widowed    Spouse name: Not on file   Number of children: Not  on file   Years of education: Not on file   Highest education level: Not on file  Occupational History   Not on file  Tobacco Use   Smoking status: Never   Smokeless tobacco: Never  Vaping Use   Vaping Use: Never used  Substance and Sexual Activity   Alcohol use: Never   Drug use: Never   Sexual activity: Not Currently  Other Topics Concern   Not on file  Social History Narrative   Widow.   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   Social Determinants of Health   Financial Resource Strain: Low Risk  (04/04/2023)   Overall Financial Resource Strain (CARDIA)    Difficulty of Paying Living Expenses: Not hard at all  Food Insecurity: No Food Insecurity (04/04/2023)   Hunger Vital Sign    Worried About Running Out of Food in the Last Year: Never true    Ran Out of Food in the Last Year: Never true  Transportation Needs: No Transportation Needs (04/04/2023)   PRAPARE - Administrator, Civil Service (Medical): No    Lack of Transportation (Non-Medical): No  Physical Activity: Sufficiently Active (04/04/2023)   Exercise Vital Sign    Days of Exercise per Week: 7 days    Minutes of Exercise per Session: 30 min  Stress: No Stress Concern Present (04/04/2023)   [employer] of Occupational Health - Occupational Stress Questionnaire    Feeling of Stress : Not at all  Social Connections: Moderately Isolated (04/04/2023)   Social Connection and Isolation Panel [NHANES]    Frequency of Communication with Friends and Family: More than three times a week    Frequency of Social Gatherings with Friends and Family: More than three times a week    Attends Religious Services: 1 to 4 times per year    Active Member of [employer] or Organizations: No    Attends Banker Meetings: Never    Marital Status: Widowed    Tobacco Counseling Counseling given: Not Answered   Clinical Intake:  Pre-visit preparation completed: Yes  Pain : No/denies pain     BMI  - recorded: 27.37 Nutritional Risks: None Diabetes: No  How often do you need to have someone help you when you read instructions, pamphlets, or other written materials from your doctor or pharmacy?: 1 - Never  Interpreter Needed?: No  Information entered by :: C.Deisi Salonga LPN   Activities of Daily Living    04/04/2023   11:23 AM  In your present state of health, do you have any difficulty performing the following activities:  Hearing? 0  Vision? 0  Difficulty concentrating or making decisions? 1  Comment Dementia on medications  Walking or climbing stairs? 1  Dressing or bathing? 1  Comment Aid  assists  Doing errands, shopping? 1  Comment Daughter  Quarry manager and eating ? Y  Comment aid/daughter assists  Using the Toilet? Y  In the past six months, have you accidently leaked urine? Y  Comment wears depends  Do you have problems with loss of bowel control? Y  Managing your Medications? Y  Managing your Finances? Y  Housekeeping or managing your Housekeeping? Y    Patient Care Team: Doreene Nest, NP as PCP - General (Internal Medicine)  Indicate any recent Medical Services you may have received from other than [facility] providers in the past year (date may be approximate).     Assessment:   This is a routine wellness examination for Cloria.  Hearing/Vision screen Hearing Screening - Comments:: No issues Vision Screening - Comments:: Glasses - [facility]- UTD on eye exams   Dietary issues and exercise activities discussed:     Goals Addressed             This Visit's Progress    Patient Stated       No new goals       Depression Screen    04/04/2023   11:19 AM 12/08/2022    1:02 PM 06/29/2022   12:45 PM 03/30/2022   11:16 AM 02/25/2022   11:23 AM 01/12/2022   11:36 AM 03/27/2021   11:48 AM  PHQ 2/9 Scores  PHQ - 2 Score 0 4  0 0 0 0  PHQ- 9 Score  10     0  Exception Documentation   Other- indicate reason in comment box      Not  completed   patient not able to answer questions.        Fall Risk    04/04/2023   11:22 AM 02/17/2023    2:45 PM 12/13/2022    3:40 PM 12/08/2022   12:13 PM 03/30/2022   11:19 AM  Fall Risk   Falls in the past year? 1 1 1 1 1   Number falls in past yr: 1 0 1 1 0  Injury with Fall? 0 0 1 1 0  Risk for fall due to : No Fall Risks;Impaired balance/gait Impaired balance/gait;Impaired mobility History of fall(s) History of fall(s) History of fall(s);Impaired balance/gait;Mental status change  Follow up Falls prevention discussed;Falls evaluation completed Falls evaluation completed Falls evaluation completed Falls evaluation completed Falls prevention discussed    MEDICARE RISK AT HOME:  Medicare Risk at Home - 04/04/23 1125     Any stairs in or around the home? Yes    If so, are there any without handrails? No    Home free of loose throw rugs in walkways, pet beds, electrical cords, etc? Yes    Adequate lighting in your home to reduce risk of falls? Yes    Life alert? No    Use of a cane, walker or w/c? No    Grab bars in the bathroom? No    Shower chair or bench in shower? Yes    Elevated toilet seat or a handicapped toilet? Yes              Cognitive Function:    03/27/2021   11:50 AM 03/04/2020    9:55 AM 01/24/2019    2:56 PM  MMSE - Mini Mental State Exam  Not completed: Unable to complete Unable to complete Unable to complete        04/04/2023   11:27 AM  6CIT Screen  What Year?  0 points  What month? 0 points  What time? 3 points  Count back from 20 0 points  Months in reverse 2 points  Repeat phrase 10 points  Total Score 15 points    Immunizations Immunization History  Administered Date(s) Administered   Fluad Quad(high Dose 65+) 08/11/2021, 06/29/2022   Influenza Split 08/03/2004, 07/18/2009, 06/23/2011   Influenza, High Dose Seasonal PF 06/26/2012, 08/13/2013, 07/05/2014, 08/26/2015, 08/26/2016, 09/09/2017, 08/27/2019   Influenza,inj,Quad PF,6+ Mos  07/21/2018   Moderna Sars-Covid-2 Vaccination 11/13/2019, 12/11/2019   PPD Test 05/31/2018   Pneumococcal Conjugate-13 11/20/2013   Pneumococcal Polysaccharide-23 08/19/2011, 07/21/2018    TDAP status: Up to date  Flu Vaccine status: Up to date  Pneumococcal vaccine status: Up to date  Covid-19 vaccine status: Declined, Education has been provided regarding the importance of this vaccine but patient still declined. Advised may receive this vaccine at local pharmacy or Health Dept.or vaccine clinic. Aware to provide a copy of the vaccination record if obtained from local pharmacy or Health Dept. Verbalized acceptance and understanding.  Qualifies for Shingles Vaccine? Yes   Zostavax completed  unknown   Shingrix Completed?: Yes  Screening Tests Health Maintenance  Topic Date Due   COVID-19 Vaccine (3 - 2023-24 season) 06/11/2022   Zoster Vaccines- Shingrix (1 of 2) 05/20/2023 (Originally [birth year]1987)   INFLUENZA VACCINE  05/12/2023   Medicare Annual Wellness (AWV)  04/03/2024   Pneumonia Vaccine 32+ Years old  Completed   DEXA SCAN  Completed   HPV VACCINES  Aged Out   DTaP/Tdap/Td  Discontinued    Health Maintenance  Health Maintenance Due  Topic Date Due   COVID-19 Vaccine (3 - 2023-24 season) 06/11/2022    Colorectal cancer screening: No longer required.   Mammogram status: No longer required due to age.  Bone Scan - Declined  Lung Cancer Screening: (Low Dose CT Chest recommended if Age 75-80 years, 20 pack-year currently smoking OR have quit w/in 15years.) does not qualify.   Lung Cancer Screening Referral: no  Additional Screening:  Hepatitis C Screening: does not qualify; Completed nn/a  Vision Screening: Recommended annual ophthalmology exams for early detection of glaucoma and other disorders of the eye. Is the patient up to date with their annual eye exam?  Yes  Who is the provider or what is the name of the office in which the patient attends annual eye  exams? [facility] If pt is not established with a provider, would they like to be referred to a provider to establish care? Yes .   Dental Screening: Recommended annual dental exams for proper oral hygiene   Community Resource Referral / Chronic Care Management: CRR required this visit?  No   CCM required this visit?  No     Plan:     I have personally reviewed and noted the following in the patient's chart:   Medical and social history Use of alcohol, tobacco or illicit drugs  Current medications and supplements including opioid prescriptions. Patient is not currently taking opioid prescriptions. Functional ability and status Nutritional status Physical activity Advanced directives List of other physicians Hospitalizations, surgeries, and ER visits in previous 12 months Vitals Screenings to include cognitive, depression, and falls Referrals and appointments  In addition, I have reviewed and discussed with patient certain preventive protocols, quality metrics, and best practice recommendations. A written personalized care plan for preventive services as well as general preventive health recommendations were provided to patient.     Berdina Cheever Linus Orn, LPN  04/04/2023   After Visit Summary: (MyChart) Due to this being a telephonic visit, the after visit summary with patients personalized plan was offered to patient via MyChart   Nurse Notes: none

## 2023-04-04 NOTE — Patient Instructions (Signed)
[name] , Thank you for taking time to come for your Medicare Wellness Visit. I appreciate your ongoing commitment to your health goals. Please review the following plan we discussed and let me know if I can assist you in the future.   These are the goals we discussed:  Goals      Patient Stated     Starting 01/24/2019, I will continue to take medications as prescribed.      Patient Stated     03/04/2020, I will maintain and continue medications as prescribed.     Patient Stated     03/27/2021, I will continue to walk 3-4 days a week for 15 minutes     Patient Stated     No new goals        This is a list of the screening recommended for you and due dates:  Health Maintenance  Topic Date Due   COVID-19 Vaccine (3 - 2023-24 season) 06/11/2022   Zoster (Shingles) Vaccine (1 of 2) 05/20/2023*   Flu Shot  05/12/2023   Medicare Annual Wellness Visit  04/03/2024   Pneumonia Vaccine  Completed   DEXA scan (bone density measurement)  Completed   HPV Vaccine  Aged Out   DTaP/Tdap/Td vaccine  Discontinued  *Topic was postponed. The date shown is not the original due date.    Advanced directives: none  Conditions/risks identified: none  Next appointment: Follow up in one year for your annual wellness visit 04/04/24 @ 10:45 telephone   Preventive Care 65 Years and Older, Female Preventive care refers to lifestyle choices and visits with your health care provider that can promote health and wellness. What does preventive care include? A yearly physical exam. This is also called an annual well check. Dental exams once or twice a year. Routine eye exams. Ask your health care provider how often you should have your eyes checked. Personal lifestyle choices, including: Daily care of your teeth and gums. Regular physical activity. Eating a healthy diet. Avoiding tobacco and drug use. Limiting alcohol use. Practicing safe sex. Taking low-dose aspirin every day. Taking vitamin and  mineral supplements as recommended by your health care provider. What happens during an annual well check? The services and screenings done by your health care provider during your annual well check will depend on your age, overall health, lifestyle risk factors, and family history of disease. Counseling  Your health care provider may ask you questions about your: Alcohol use. Tobacco use. Drug use. Emotional well-being. Home and relationship well-being. Sexual activity. Eating habits. History of falls. Memory and ability to understand (cognition). Work and work Astronomer. Reproductive health. Screening  You may have the following tests or measurements: Height, weight, and BMI. Blood pressure. Lipid and cholesterol levels. These may be checked every 5 years, or more frequently if you are over 32 years old. Skin check. Lung cancer screening. You may have this screening every year starting at age 10 if you have a 30-pack-year history of smoking and currently smoke or have quit within the past 15 years. Fecal occult blood test (FOBT) of the stool. You may have this test every year starting at age 46. Flexible sigmoidoscopy or colonoscopy. You may have a sigmoidoscopy every 5 years or a colonoscopy every 10 years starting at age 62. Hepatitis C blood test. Hepatitis B blood test. Sexually transmitted disease (STD) testing. Diabetes screening. This is done by checking your blood sugar (glucose) after you have not eaten for a while (fasting). You may  have this done every 1-3 years. Bone density scan. This is done to screen for osteoporosis. You may have this done starting at age 54. Mammogram. This may be done every 1-2 years. Talk to your health care provider about how often you should have regular mammograms. Talk with your health care provider about your test results, treatment options, and if necessary, the need for more tests. Vaccines  Your health care provider may recommend  certain vaccines, such as: Influenza vaccine. This is recommended every year. Tetanus, diphtheria, and acellular pertussis (Tdap, Td) vaccine. You may need a Td booster every 10 years. Zoster vaccine. You may need this after age 58. Pneumococcal 13-valent conjugate (PCV13) vaccine. One dose is recommended after age 41. Pneumococcal polysaccharide (PPSV23) vaccine. One dose is recommended after age 63. Talk to your health care provider about which screenings and vaccines you need and how often you need them. This information is not intended to replace advice given to you by your health care provider. Make sure you discuss any questions you have with your health care provider. Document Released: 10/24/2015 Document Revised: 06/16/2016 Document Reviewed: 07/29/2015 Elsevier Interactive Patient Education  2017 [place] Prevention in the Home Falls can cause injuries. They can happen to people of all ages. There are many things you can do to make your home safe and to help prevent falls. What can I do on the outside of my home? Regularly fix the edges of walkways and driveways and fix any cracks. Remove anything that might make you trip as you walk through a door, such as a raised step or threshold. Trim any bushes or trees on the path to your home. Use bright outdoor lighting. Clear any walking paths of anything that might make someone trip, such as rocks or tools. Regularly check to see if handrails are loose or broken. Make sure that both sides of any steps have handrails. Any raised decks and porches should have guardrails on the edges. Have any leaves, snow, or ice cleared regularly. Use sand or salt on walking paths during winter. Clean up any spills in your garage right away. This includes oil or grease spills. What can I do in the bathroom? Use night lights. Install grab bars by the toilet and in the tub and shower. Do not use towel bars as grab bars. Use non-skid mats or  decals in the tub or shower. If you need to sit down in the shower, use a plastic, non-slip stool. Keep the floor dry. Clean up any water that spills on the floor as soon as it happens. Remove soap buildup in the tub or shower regularly. Attach bath mats securely with double-sided non-slip rug tape. Do not have throw rugs and other things on the floor that can make you trip. What can I do in the bedroom? Use night lights. Make sure that you have a light by your bed that is easy to reach. Do not use any sheets or blankets that are too big for your bed. They should not hang down onto the floor. Have a firm chair that has side arms. You can use this for support while you get dressed. Do not have throw rugs and other things on the floor that can make you trip. What can I do in the kitchen? Clean up any spills right away. Avoid walking on wet floors. Keep items that you use a lot in easy-to-reach places. If you need to reach something above you, use a strong step  stool that has a grab bar. Keep electrical cords out of the way. Do not use floor polish or wax that makes floors slippery. If you must use wax, use non-skid floor wax. Do not have throw rugs and other things on the floor that can make you trip. What can I do with my stairs? Do not leave any items on the stairs. Make sure that there are handrails on both sides of the stairs and use them. Fix handrails that are broken or loose. Make sure that handrails are as long as the stairways. Check any carpeting to make sure that it is firmly attached to the stairs. Fix any carpet that is loose or worn. Avoid having throw rugs at the top or bottom of the stairs. If you do have throw rugs, attach them to the floor with carpet tape. Make sure that you have a light switch at the top of the stairs and the bottom of the stairs. If you do not have them, ask someone to add them for you. What else can I do to help prevent falls? Wear shoes that: Do not  have high heels. Have rubber bottoms. Are comfortable and fit you well. Are closed at the toe. Do not wear sandals. If you use a stepladder: Make sure that it is fully opened. Do not climb a closed stepladder. Make sure that both sides of the stepladder are locked into place. Ask someone to hold it for you, if possible. Clearly mark and make sure that you can see: Any grab bars or handrails. First and last steps. Where the edge of each step is. Use tools that help you move around (mobility aids) if they are needed. These include: Canes. Walkers. Scooters. Crutches. Turn on the lights when you go into a dark area. Replace any light bulbs as soon as they burn out. Set up your furniture so you have a clear path. Avoid moving your furniture around. If any of your floors are uneven, fix them. If there are any pets around you, be aware of where they are. Review your medicines with your doctor. Some medicines can make you feel dizzy. This can increase your chance of falling. Ask your doctor what other things that you can do to help prevent falls. This information is not intended to replace advice given to you by your health care provider. Make sure you discuss any questions you have with your health care provider. Document Released: 07/24/2009 Document Revised: 03/04/2016 Document Reviewed: 11/01/2014 Elsevier Interactive Patient Education  2017 [employer].

## 2023-04-07 DIAGNOSIS — G309 Alzheimer's disease, unspecified: Secondary | ICD-10-CM | POA: Diagnosis not present

## 2023-04-07 DIAGNOSIS — R443 Hallucinations, unspecified: Secondary | ICD-10-CM | POA: Diagnosis not present

## 2023-04-07 DIAGNOSIS — J189 Pneumonia, unspecified organism: Secondary | ICD-10-CM | POA: Diagnosis not present

## 2023-04-07 DIAGNOSIS — F0284 Dementia in other diseases classified elsewhere, unspecified severity, with anxiety: Secondary | ICD-10-CM | POA: Diagnosis not present

## 2023-04-07 DIAGNOSIS — I10 Essential (primary) hypertension: Secondary | ICD-10-CM | POA: Diagnosis not present

## 2023-04-07 DIAGNOSIS — F0282 Dementia in other diseases classified elsewhere, unspecified severity, with psychotic disturbance: Secondary | ICD-10-CM | POA: Diagnosis not present

## 2023-04-26 ENCOUNTER — Other Ambulatory Visit: Payer: Self-pay | Admitting: Primary Care

## 2023-04-26 DIAGNOSIS — K219 Gastro-esophageal reflux disease without esophagitis: Secondary | ICD-10-CM

## 2023-04-27 NOTE — Telephone Encounter (Signed)
Patient has been scheduled

## 2023-04-27 NOTE — Telephone Encounter (Signed)
Can we get her in the office for follow up in September or October? Thanks!

## 2023-05-07 IMAGING — CT CT HEAD W/O CM
3 series · 14 of 47 positions shown, 16 images · non-contrast
Comparison: Head CT dated 12/03/2018.

CLINICAL DATA: Altered mental status.



[Series 2: head wo · axial · 0.47mm/px · z∈[-135,-5]mm · 8 of 32 slices shown, 10 images]
[im 3/32  brain]
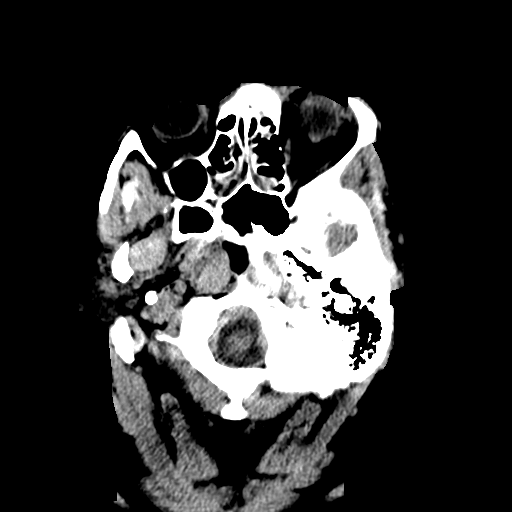
[im 3/32  bone]
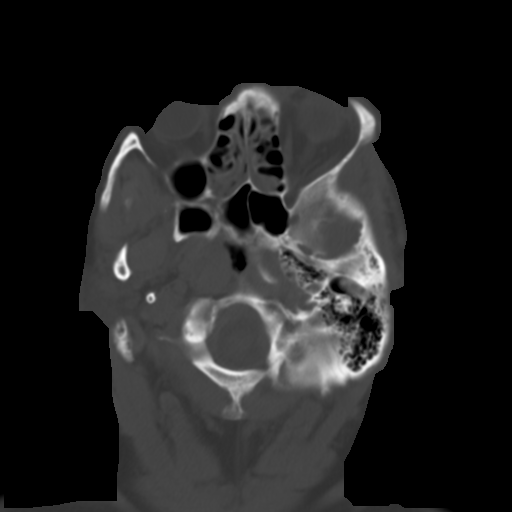
[im 7/32  brain]
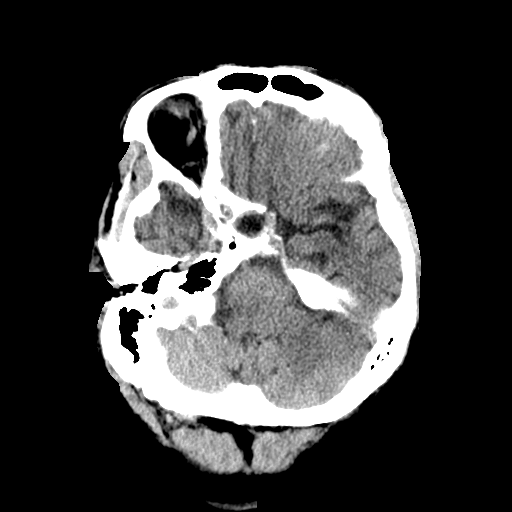
[im 10/32  brain]
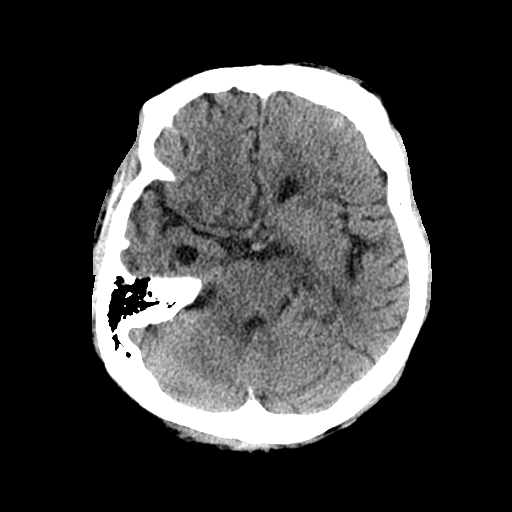
[im 14/32  brain]
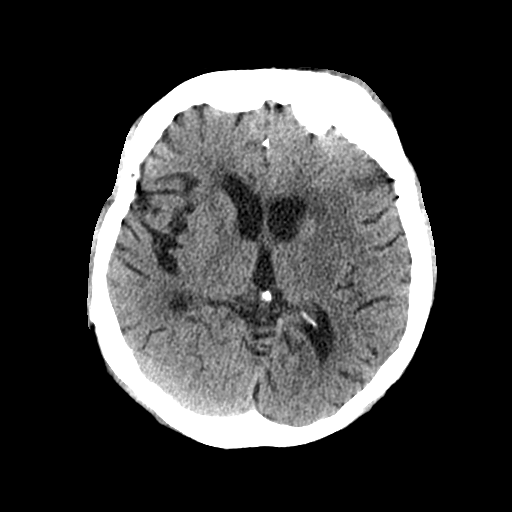
[im 18/32  brain]
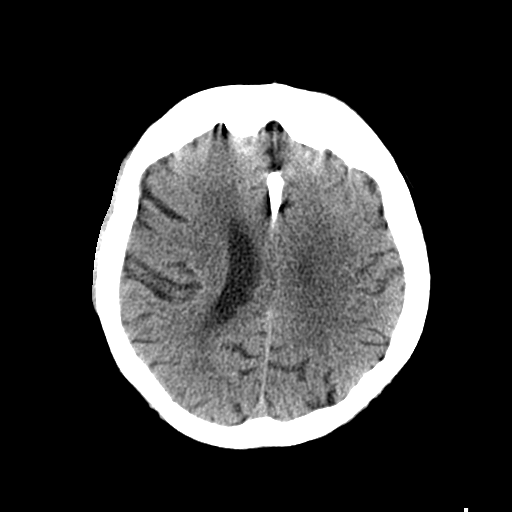
[im 18/32  bone]
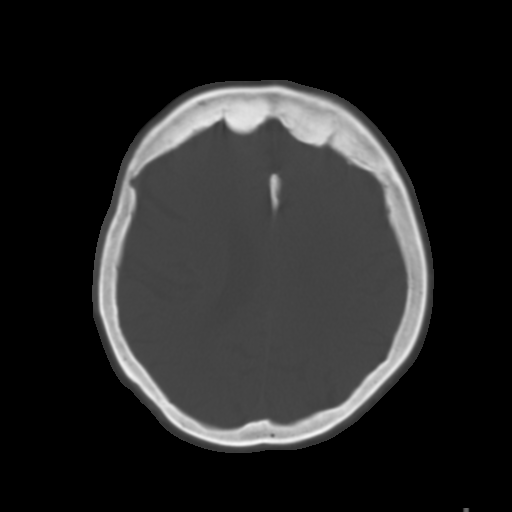
[im 22/32  brain]
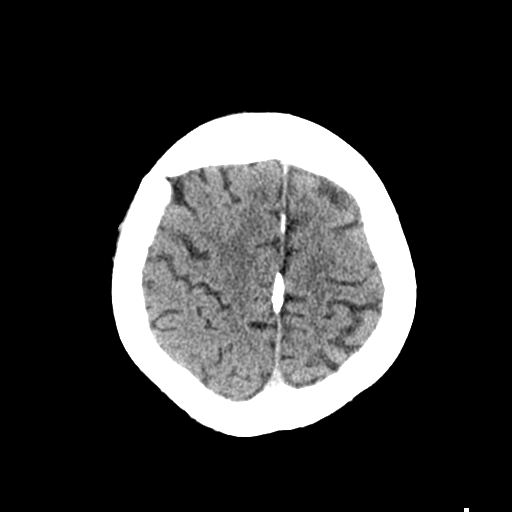
[im 25/32  brain]
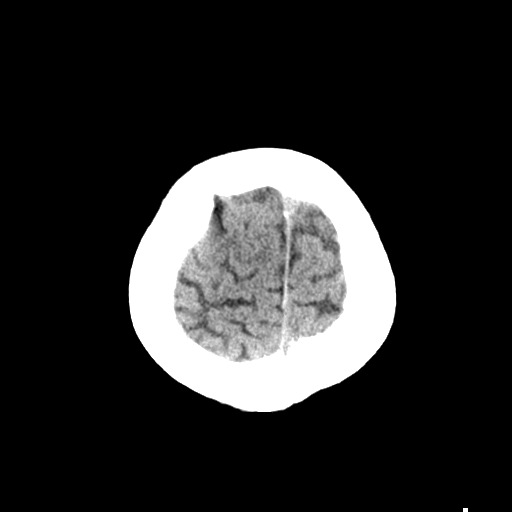
[im 29/32  brain]
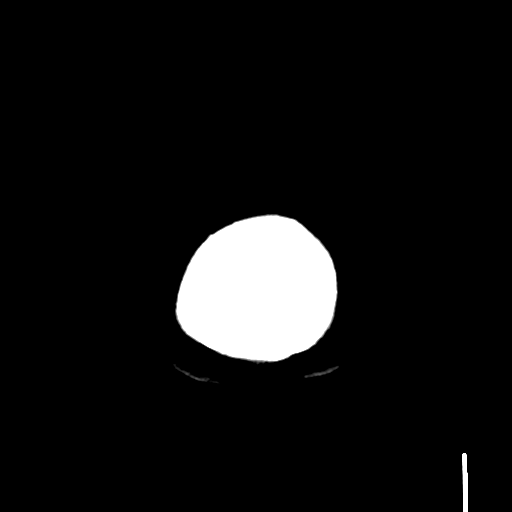

[Series 4: coronal soft tissue · coronal · 0.33mm/px · 3 of 65 slices shown]
[im 22/65  brain]
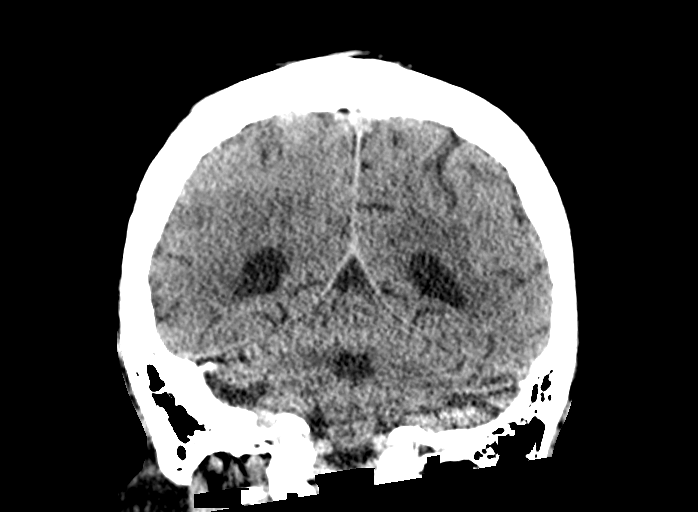
[im 29/65  brain]
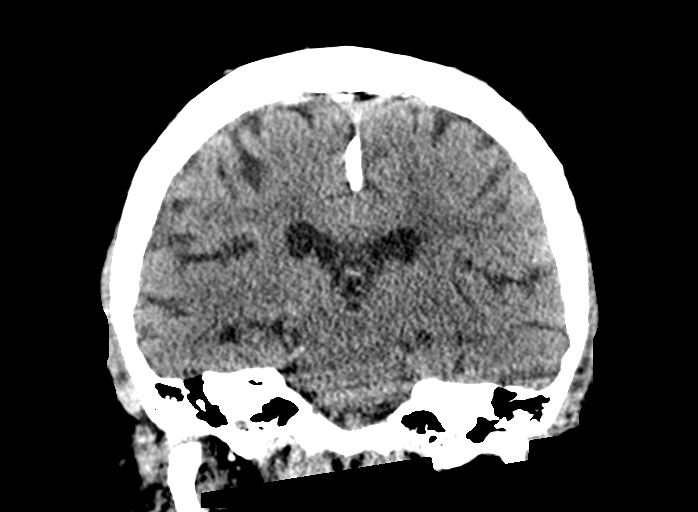
[im 36/65  brain]
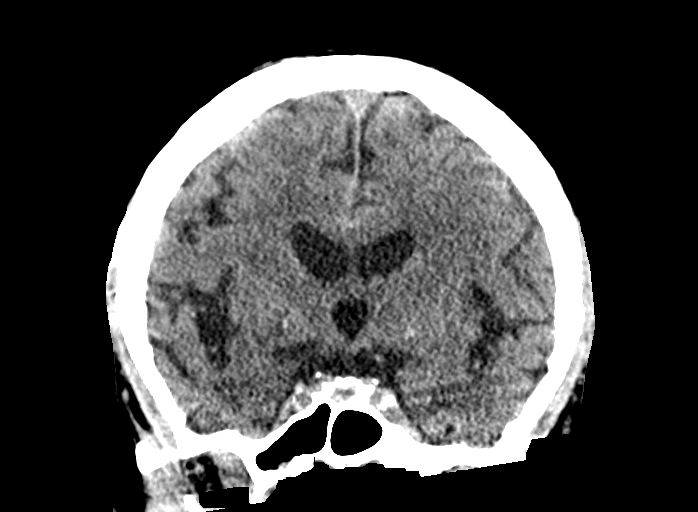

[Series 5: sagittal soft tissue · sagittal · 0.38mm/px · 3 of 59 slices shown]
[im 21/59  brain]
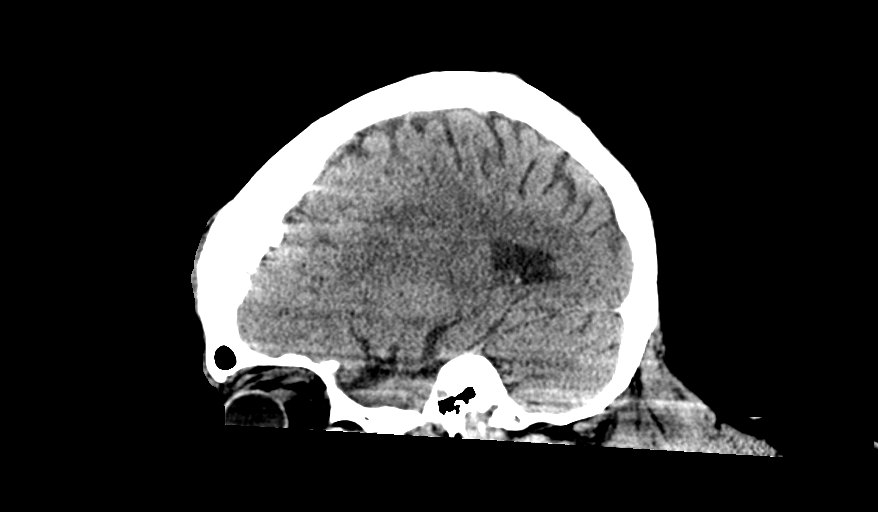
[im 30/59  brain]
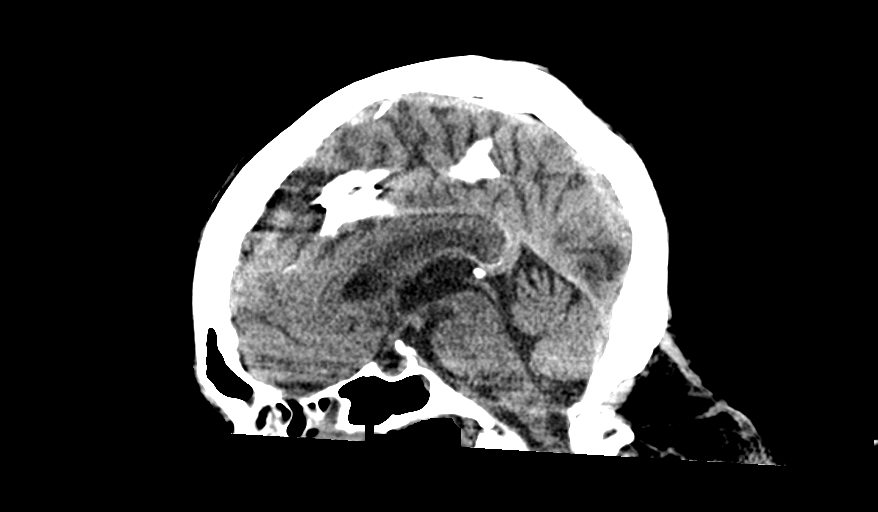
[im 39/59  brain]
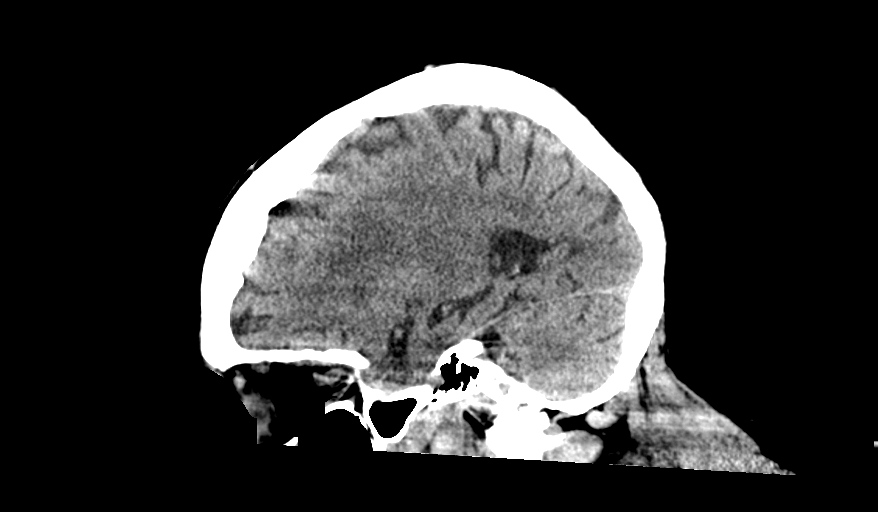

[14 of 47 positions shown; findings below may reference images not displayed]

FINDINGS: Evaluation of this exam is limited due to motion artifact.

Brain: Mild age-related atrophy and chronic microvascular ischemic
changes. There is no acute intracranial hemorrhage. No mass effect
or midline shift. No extra-axial fluid collection.

Vascular: No hyperdense vessel or unexpected calcification.

Skull: Normal. Negative for fracture or focal lesion.

Sinuses/Orbits: There is diffuse mucoperiosteal thickening of
paranasal sinuses. No air-fluid level. The mastoid air cells are
clear.

Other: Left forehead contusion
IMPRESSION: 1. No acute intracranial pathology.
2. Mild age-related atrophy and chronic microvascular ischemic
changes.

## 2023-05-26 ENCOUNTER — Encounter (INDEPENDENT_AMBULATORY_CARE_PROVIDER_SITE_OTHER): Payer: Self-pay

## 2023-05-27 IMAGING — DX DG CHEST 2V
2 series · 2 of 2 positions shown · non-contrast
Comparison: 12/22/2021

CLINICAL DATA: 86-year-old female with a history of follow-up
pneumonia

EXAM:
CHEST - 2 VIEW

[chest lat]
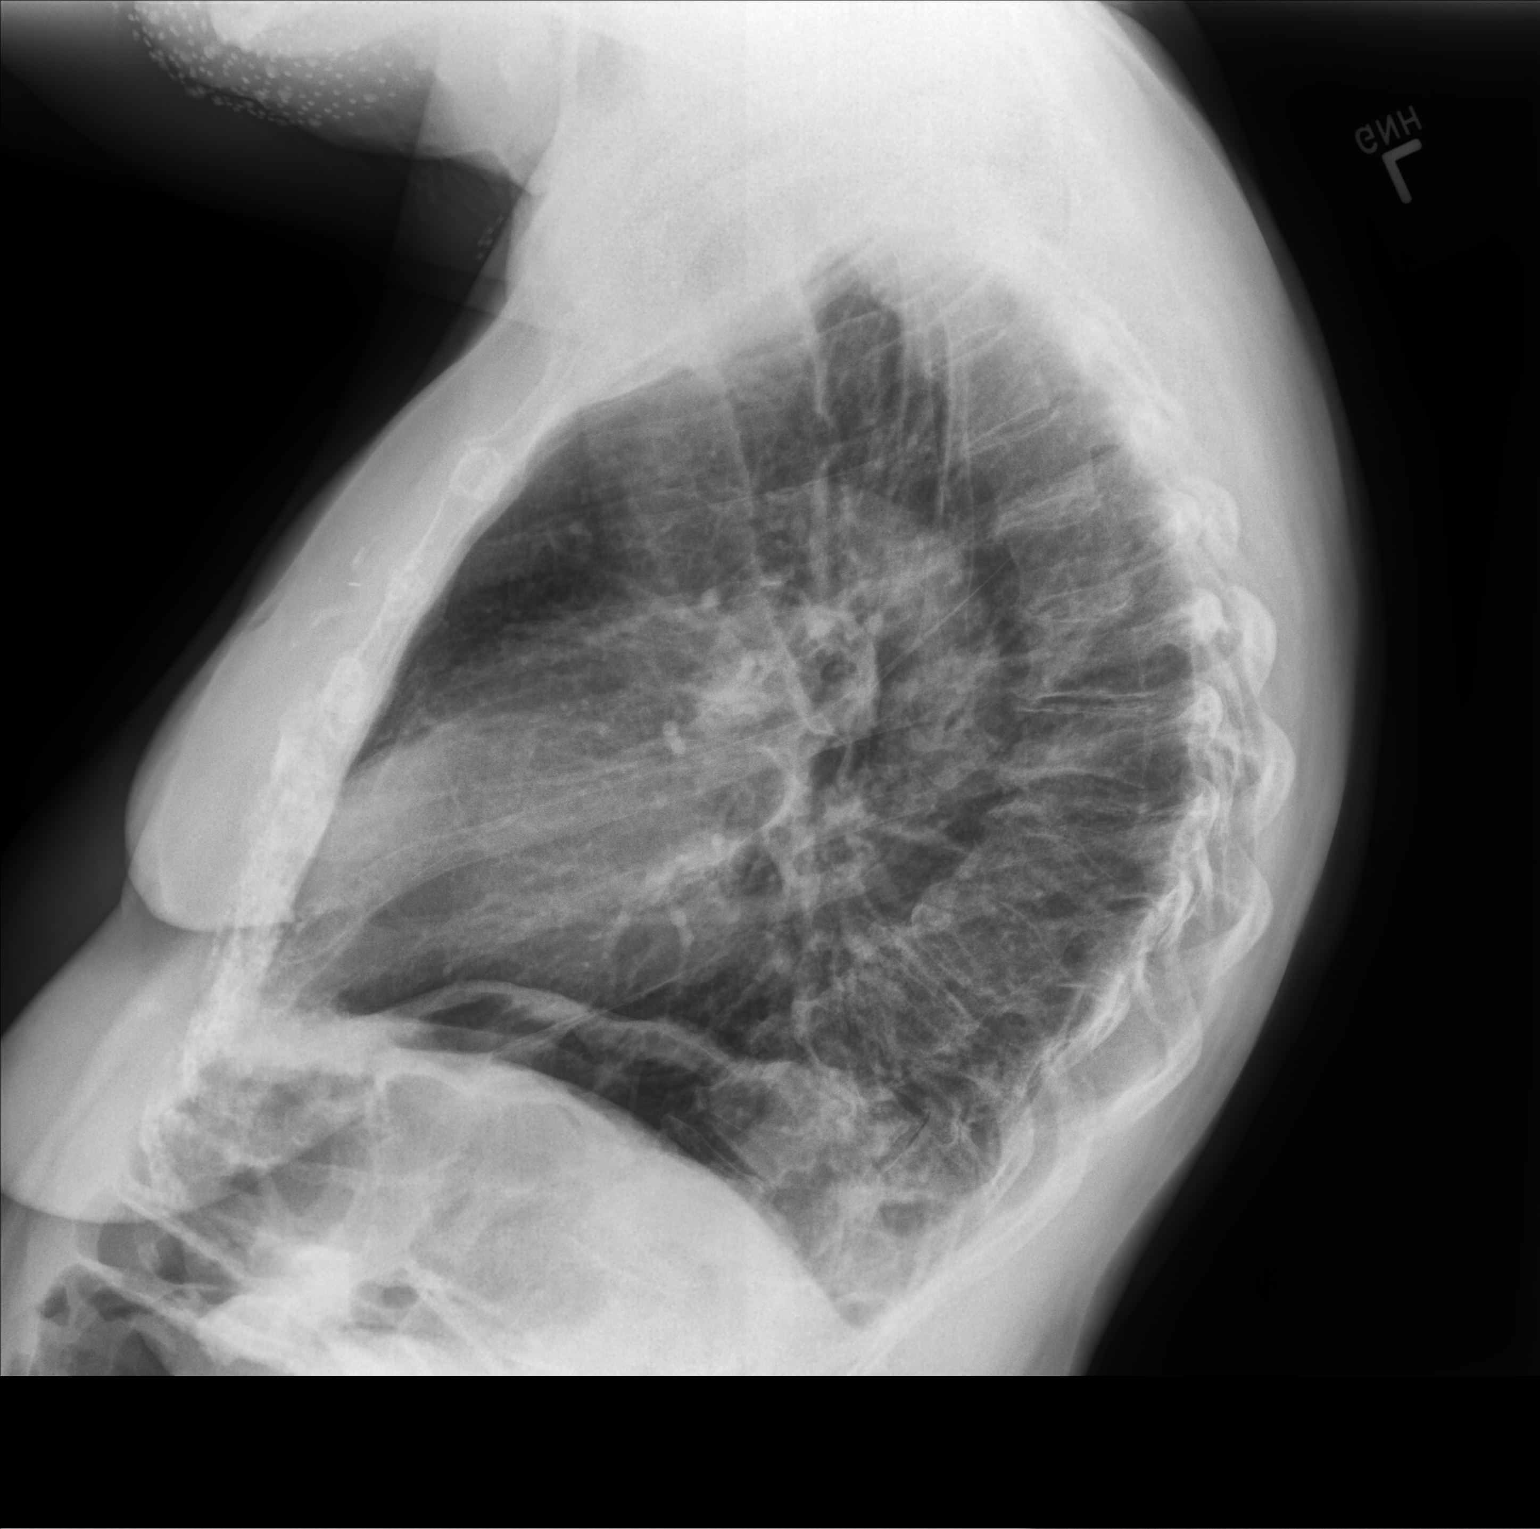

[chest pa]
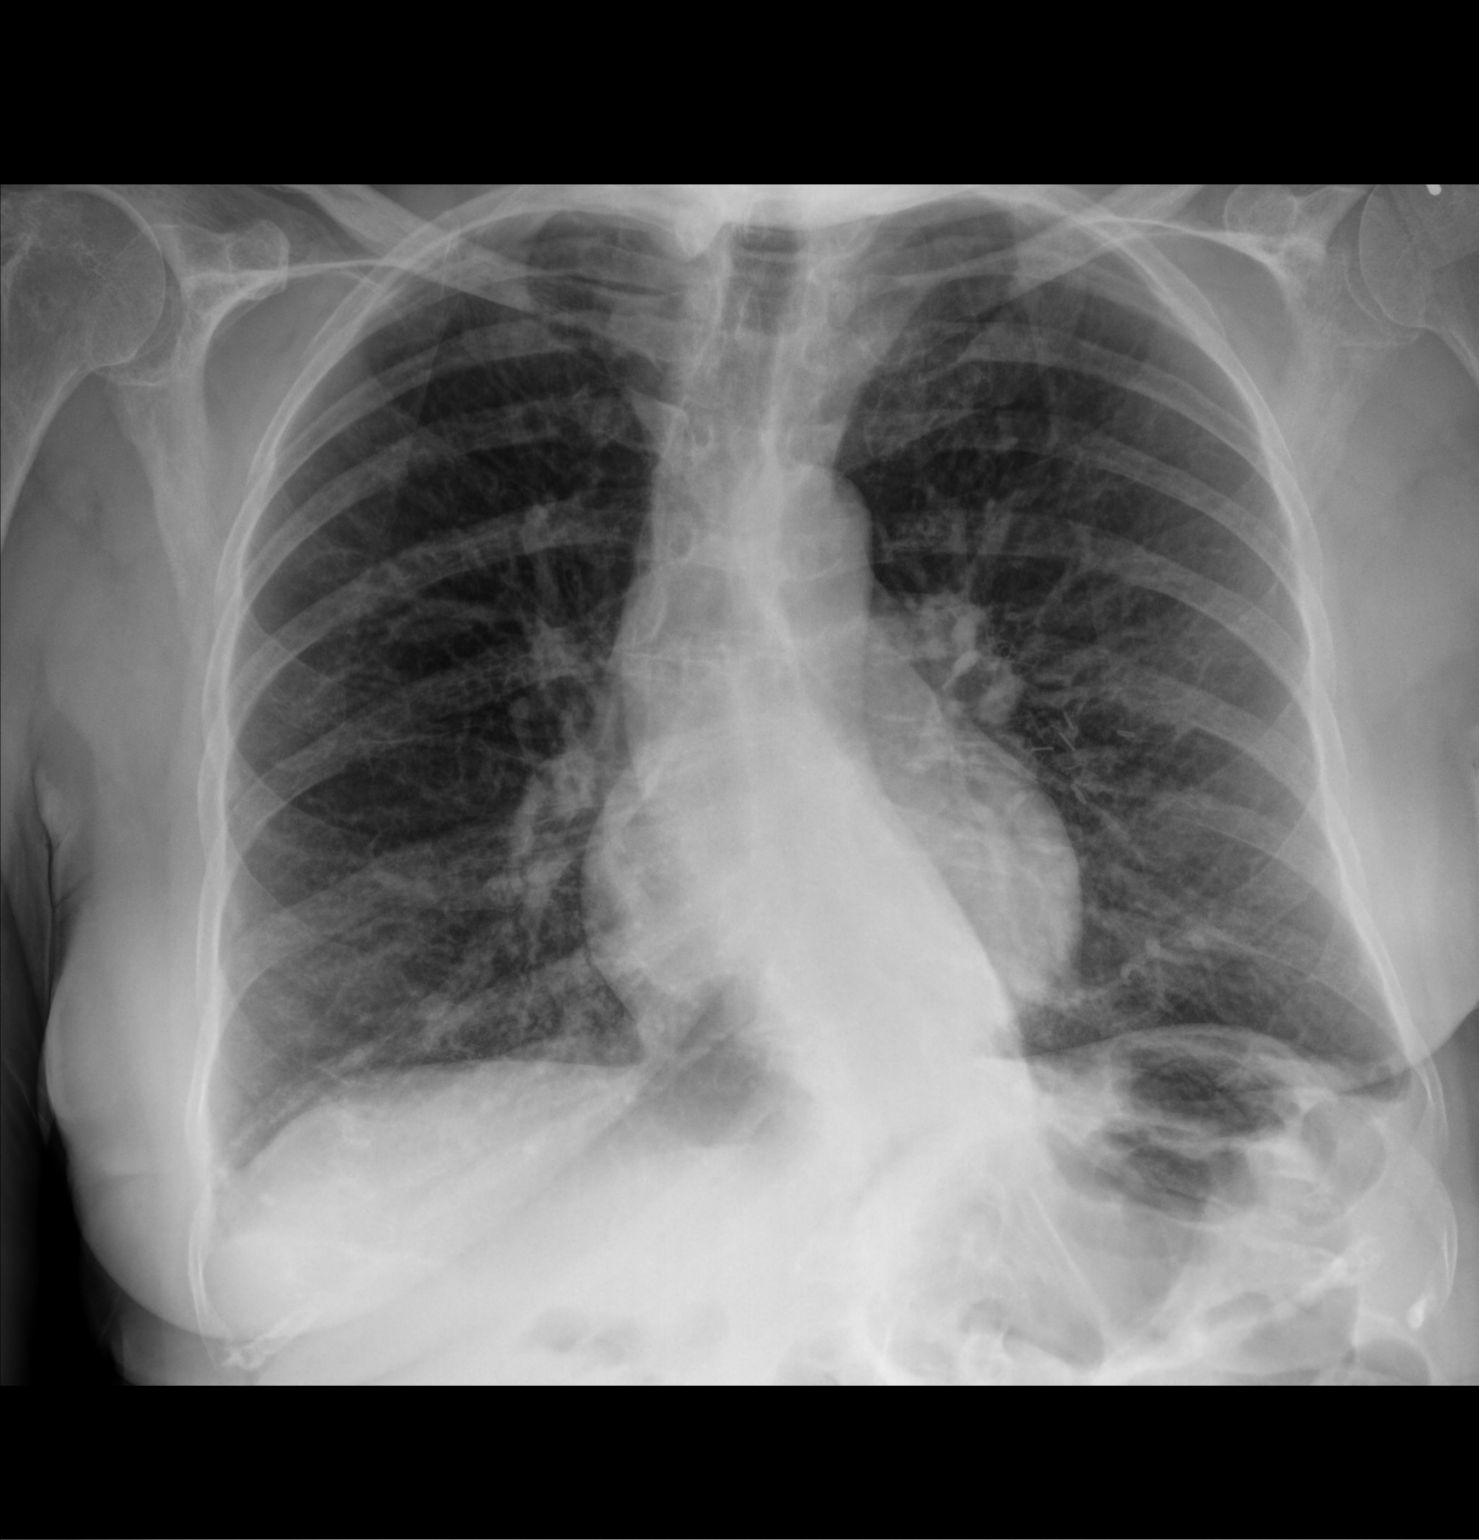

[2 of 2 positions shown; findings below may reference images not displayed]

FINDINGS: Cardiomediastinal silhouette unchanged in size and contour. No
evidence of central vascular congestion. No interlobular septal
thickening.

Improved aeration of the lungs, with improved lung volumes and no
confluent airspace disease. Coarsened interstitial markings
throughout, similar to the comparison.

No pneumothorax or pleural effusion.

No acute displaced fracture. Degenerative changes of the spine.

Scoliotic curvature of the spine.
IMPRESSION: Improved appearance of the chest x-ray, with improved lung volumes,
improved airspace disease, and no evidence of acute cardiopulmonary
disease.

## 2023-05-29 ENCOUNTER — Other Ambulatory Visit: Payer: Self-pay | Admitting: Primary Care

## 2023-05-29 DIAGNOSIS — F03918 Unspecified dementia, unspecified severity, with other behavioral disturbance: Secondary | ICD-10-CM

## 2023-05-29 DIAGNOSIS — F418 Other specified anxiety disorders: Secondary | ICD-10-CM

## 2023-05-29 NOTE — Telephone Encounter (Signed)
Refills sent to pharmacy. 

## 2023-06-15 ENCOUNTER — Other Ambulatory Visit: Payer: Self-pay | Admitting: Primary Care

## 2023-06-15 DIAGNOSIS — E785 Hyperlipidemia, unspecified: Secondary | ICD-10-CM

## 2023-06-15 DIAGNOSIS — F05 Delirium due to known physiological condition: Secondary | ICD-10-CM | POA: Diagnosis not present

## 2023-06-15 DIAGNOSIS — R441 Visual hallucinations: Secondary | ICD-10-CM | POA: Diagnosis not present

## 2023-06-15 DIAGNOSIS — F514 Sleep terrors [night terrors]: Secondary | ICD-10-CM | POA: Diagnosis not present

## 2023-06-15 DIAGNOSIS — R413 Other amnesia: Secondary | ICD-10-CM | POA: Diagnosis not present

## 2023-06-15 DIAGNOSIS — M79605 Pain in left leg: Secondary | ICD-10-CM | POA: Diagnosis not present

## 2023-06-15 DIAGNOSIS — I10 Essential (primary) hypertension: Secondary | ICD-10-CM

## 2023-06-15 DIAGNOSIS — F411 Generalized anxiety disorder: Secondary | ICD-10-CM | POA: Diagnosis not present

## 2023-06-15 DIAGNOSIS — Z8659 Personal history of other mental and behavioral disorders: Secondary | ICD-10-CM | POA: Diagnosis not present

## 2023-06-15 DIAGNOSIS — M7989 Other specified soft tissue disorders: Secondary | ICD-10-CM | POA: Diagnosis not present

## 2023-06-15 DIAGNOSIS — R296 Repeated falls: Secondary | ICD-10-CM | POA: Diagnosis not present

## 2023-06-15 DIAGNOSIS — I890 Lymphedema, not elsewhere classified: Secondary | ICD-10-CM | POA: Diagnosis not present

## 2023-06-21 ENCOUNTER — Ambulatory Visit (INDEPENDENT_AMBULATORY_CARE_PROVIDER_SITE_OTHER): Payer: Medicare Other | Admitting: Primary Care

## 2023-06-21 ENCOUNTER — Encounter: Payer: Self-pay | Admitting: Primary Care

## 2023-06-21 VITALS — BP 138/64 | HR 63 | Temp 98.0°F | Ht 68.0 in | Wt 154.0 lb

## 2023-06-21 DIAGNOSIS — R296 Repeated falls: Secondary | ICD-10-CM | POA: Insufficient documentation

## 2023-06-21 DIAGNOSIS — H6123 Impacted cerumen, bilateral: Secondary | ICD-10-CM | POA: Diagnosis not present

## 2023-06-21 DIAGNOSIS — L89152 Pressure ulcer of sacral region, stage 2: Secondary | ICD-10-CM | POA: Diagnosis not present

## 2023-06-21 DIAGNOSIS — K219 Gastro-esophageal reflux disease without esophagitis: Secondary | ICD-10-CM | POA: Diagnosis not present

## 2023-06-21 DIAGNOSIS — F03918 Unspecified dementia, unspecified severity, with other behavioral disturbance: Secondary | ICD-10-CM

## 2023-06-21 DIAGNOSIS — K5909 Other constipation: Secondary | ICD-10-CM | POA: Diagnosis not present

## 2023-06-21 DIAGNOSIS — I10 Essential (primary) hypertension: Secondary | ICD-10-CM | POA: Diagnosis not present

## 2023-06-21 DIAGNOSIS — E785 Hyperlipidemia, unspecified: Secondary | ICD-10-CM | POA: Diagnosis not present

## 2023-06-21 DIAGNOSIS — Z23 Encounter for immunization: Secondary | ICD-10-CM

## 2023-06-21 DIAGNOSIS — N3281 Overactive bladder: Secondary | ICD-10-CM

## 2023-06-21 DIAGNOSIS — R7303 Prediabetes: Secondary | ICD-10-CM

## 2023-06-21 LAB — BASIC METABOLIC PANEL
BUN: 13 mg/dL (ref 6–23)
CO2: 31 meq/L (ref 19–32)
Calcium: 9.1 mg/dL (ref 8.4–10.5)
Chloride: 105 meq/L (ref 96–112)
Creatinine, Ser: 1 mg/dL (ref 0.40–1.20)
GFR: 51.25 mL/min — ABNORMAL LOW (ref >60.00)
Glucose, Bld: 80 mg/dL (ref 70–99)
Potassium: 3.8 meq/L (ref 3.5–5.1)
Sodium: 144 meq/L (ref 135–145)

## 2023-06-21 LAB — HEMOGLOBIN A1C: Hgb A1c MFr Bld: 6 % (ref 4.6–6.5)

## 2023-06-21 LAB — LIPID PANEL
Cholesterol: 145 mg/dL (ref 0–200)
HDL: 48.2 mg/dL (ref >39.00)
LDL Cholesterol: 85 mg/dL (ref 0–99)
NonHDL: 96.47
Total CHOL/HDL Ratio: 3
Triglycerides: 55 mg/dL (ref 0.0–149.0)
VLDL: 11 mg/dL (ref 0.0–40.0)

## 2023-06-21 NOTE — Assessment & Plan Note (Signed)
Stable per daughter.  Continue off treatment.

## 2023-06-21 NOTE — Assessment & Plan Note (Signed)
Stable.  There are continued signs of gradual decline including weight loss, recurrent falls.  Reviewed neurology notes from September 2024.  Continue Zoloft 200 mg at bedtime, Risperdal 2 mg at bedtime, memantine XR 28 mg daily, Aricept 10 mg daily. Referral placed for home health PT and nursing.

## 2023-06-21 NOTE — Assessment & Plan Note (Signed)
Repeat A1c pending. 

## 2023-06-21 NOTE — Assessment & Plan Note (Signed)
Repeat lipid panel pending. Continue atorvastatin 40 mg daily.

## 2023-06-21 NOTE — Assessment & Plan Note (Signed)
Controlled. ? ?Continue lisinopril 5 mg daily. ?CMP pending. ?

## 2023-06-21 NOTE — Assessment & Plan Note (Signed)
Referral placed for home health nursing.

## 2023-06-21 NOTE — Patient Instructions (Signed)
Stop by the lab prior to leaving today. I will notify you of your results once received.   You will either be contacted via phone regarding your referral to home health nursing and physical therapy, or you may receive a letter on your MyChart portal from our referral team with instructions for scheduling an appointment. Please let us know if you have not been contacted by anyone within two weeks.  It was a pleasure to see you today!

## 2023-06-21 NOTE — Progress Notes (Signed)
11:00  Subjective:    Patient ID: Caroline French, female    DOB: [birth year], 88 y.o.   MRN: [id number]  HPI  Caroline French is a very pleasant 88 y.o. female with a history of hypertension, sleep apnea, chronic constipation, dementia with behavioral disturbance, overactive bladder, prediabetes, hyperlipidemia who presents today for follow-up of chronic conditions.  She is accompanied by her daughter and home health aid today.  1) Hypertension: Currently managed on lisinopril 5 mg daily.   BP Readings from Last 3 Encounters:  06/21/23 138/64  02/17/23 136/68  02/07/23 (!) 153/91     2) Dementia With Behavorial Disturbance: Following with neurology and is managed on donepezil 10 mg daily, memantine XR 28 mg daily, Risperdal 2 mg at bedtime, sertraline 100 mg daily, hydroxyzine 10 mg as needed.  Last office visit with neurology was 1 week ago, Zoloft was increased to 200 mg nightly.  All other medications were continued.  She scored 11/29 on her memory test.  She continues to fall, several times weekly, last fall was yesterday and was without injury. Her appetite is good. She does experience a tremor when feeding herself. Her daughter helps to feed her. She continues to experience skin breakdown to her buttocks.  Family is willing to have her start home health PT and nursing. Her home health aid is applying numerous OTC products for barrier protection.   3) Overactive bladder: Chronic.  Failed numerous treatment options in the past.  No longer managed on treatment per patient and daughter's choice.   Today her daughter mentions less frequent urinary incontinence.   4) GERD: Currently managed on omeprazole 20 mg daily.   Review of Systems  Constitutional:  Negative for appetite change.  HENT:         Decreased ability to hear  Respiratory:  Negative for shortness of breath.   Cardiovascular:  Negative for chest pain.  Gastrointestinal:  Negative for constipation and diarrhea.   Neurological:  Negative for headaches.  Psychiatric/Behavioral:  Negative for sleep disturbance.          Past Medical History:  Diagnosis Date   Breast cancer [facility]) 1992's   left breast   CAP (community acquired pneumonia) 12/22/2021   Dementia with behavioral disturbance (HCC)    Diverticulitis    Essential hypertension    Glaucoma    Hallucinations    Overactive bladder    Sepsis (HCC) 12/23/2021    Social History   Socioeconomic History   Marital status: Widowed    Spouse name: Not on file   Number of children: Not on file   Years of education: Not on file   Highest education level: Not on file  Occupational History   Not on file  Tobacco Use   Smoking status: Never   Smokeless tobacco: Never  Vaping Use   Vaping status: Never Used  Substance and Sexual Activity   Alcohol use: Never   Drug use: Never   Sexual activity: Not Currently  Other Topics Concern   Not on file  Social History Narrative   Widow.   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   Social Determinants of Health   Financial Resource Strain: Low Risk  (04/04/2023)   Overall Financial Resource Strain (CARDIA)    Difficulty of Paying Living Expenses: Not hard at all  Food Insecurity: No Food Insecurity (04/04/2023)   Hunger Vital Sign    Worried About Running Out of Food in the Last Year: Never true  Ran Out of Food in the Last Year: Never true  Transportation Needs: No Transportation Needs (04/04/2023)   PRAPARE - Administrator, Civil Service (Medical): No    Lack of Transportation (Non-Medical): No  Physical Activity: Sufficiently Active (04/04/2023)   Exercise Vital Sign    Days of Exercise per Week: 7 days    Minutes of Exercise per Session: 30 min  Stress: No Stress Concern Present (04/04/2023)   [employer] of Occupational Health - Occupational Stress Questionnaire    Feeling of Stress : Not at all  Social Connections: Moderately Isolated (04/04/2023)    Social Connection and Isolation Panel [NHANES]    Frequency of Communication with Friends and Family: More than three times a week    Frequency of Social Gatherings with Friends and Family: More than three times a week    Attends Religious Services: 1 to 4 times per year    Active Member of [employer] or Organizations: No    Attends Banker Meetings: Never    Marital Status: Widowed  Intimate Partner Violence: Not At Risk (04/04/2023)   Humiliation, Afraid, Rape, and Kick questionnaire    Fear of Current or Ex-Partner: No    Emotionally Abused: No    Physically Abused: No    Sexually Abused: No    Past Surgical History:  Procedure Laterality Date   ABDOMINAL HYSTERECTOMY     BREAST BIOPSY     BREAST LUMPECTOMY Left 1990's   CATARACT EXTRACTION, BILATERAL  08/012020    No family history on file.  Allergies  Allergen Reactions   Penicillins Rash    Did it involve swelling of the face/tongue/throat, SOB, or low BP? Yes Did it involve sudden or severe rash/hives, skin peeling, or any reaction on the inside of your mouth or nose? Unk Did you need to seek medical attention at a hospital or doctor's office? Unk When did it last happen? "I was young" If all above answers are "NO", may proceed with cephalosporin use.     Current Outpatient Medications on File Prior to Visit  Medication Sig Dispense Refill   aspirin (ASPIRIN LOW DOSE) 81 MG EC tablet TAKE 1 TABLET BY MOUTH ONCE DAILY (Patient taking differently: Take 81 mg by mouth daily.) 90 tablet 3   atorvastatin (LIPITOR) 40 MG tablet TAKE 1 TABLET BY MOUTH EVERY DAY FOR CHOLESTEROL 90 tablet 0   Calcium Citrate-Vitamin D3 315-6.25 MG-MCG TABS Take 1 tablet by mouth daily.     docusate sodium (COLACE) 100 MG capsule Take 200 mg by mouth 2 (two) times daily. (Patient not taking: Reported on 02/17/2023)     donepezil (ARICEPT) 10 MG tablet Take 1 tablet (10 mg total) by mouth at bedtime. For memory. 90 tablet 3   fluticasone  (FLONASE) 50 MCG/ACT nasal spray INSTILL 1 SPRAY IN EACH NOSTRIL TWICE DAILY AS NEEDED FOR ALLERGIES (Patient taking differently: Place 1 spray into both nostrils 2 (two) times daily as needed for allergies.) 48 g 1   hydrOXYzine (ATARAX) 10 MG tablet TAKE 2 TABLETS (20 MG TOTAL) BY MOUTH AT BEDTIME AS NEEDED FOR ANXIETY. 180 tablet 0   latanoprost (XALATAN) 0.005 % ophthalmic solution Place 1 drop into both eyes at bedtime.     lisinopril (ZESTRIL) 5 MG tablet TAKE 1 TABLET BY MOUTH EVERY DAY FOR BLOOD PRESSURE 90 tablet 0   loratadine (CLARITIN) 10 MG tablet Take 10 mg by mouth daily.     memantine (NAMENDA XR) 28 MG CP24  24 hr capsule Take 28 mg by mouth daily.     Multiple Vitamin (MULTI-VITAMINS) TABS Take 1 tablet by mouth daily.  11   omeprazole (PRILOSEC) 20 MG capsule TAKE 1 CAPSULE (20 MG TOTAL) BY MOUTH DAILY. FOR HEARTBURN 90 capsule 0   polyethylene glycol powder (GLYCOLAX/MIRALAX) 17 GM/SCOOP powder Take 17 g by mouth daily. Pt is  taking at every day     risperiDONE (RISPERDAL) 1 MG tablet Take 2 mg by mouth at bedtime.     sertraline (ZOLOFT) 100 MG tablet Take 200 mg by mouth daily with supper.     No current facility-administered medications on file prior to visit.    BP 138/64   Pulse 63   Temp 98 F (36.7 C) (Temporal)   Ht 5\' 8"  (1.727 m)   Wt 154 lb (69.9 kg)   SpO2 98%   BMI 23.42 kg/m  Objective:   Physical Exam HENT:     Right Ear: There is impacted cerumen.     Left Ear: There is impacted cerumen.  Cardiovascular:     Rate and Rhythm: Normal rate and regular rhythm.  Pulmonary:     Effort: Pulmonary effort is normal.     Breath sounds: Normal breath sounds.  Abdominal:     Palpations: Abdomen is soft.     Tenderness: There is no abdominal tenderness.  Musculoskeletal:     Cervical back: Neck supple.  Skin:    General: Skin is warm and dry.  Psychiatric:        Mood and Affect: Mood normal.           Assessment & Plan:  Essential  hypertension Assessment & Plan: Controlled.  Continue lisinopril 5 mg daily. CMP pending.  Orders: -     Basic metabolic panel  Chronic constipation Assessment & Plan: Controlled.  Continue fiber supplement daily.    Gastroesophageal reflux disease, unspecified whether esophagitis present Assessment & Plan: Controlled.  Continue omeprazole 20 mg daily.   Bilateral impacted cerumen Assessment & Plan: Bilateral cerumen impaction identified on exam. Patient's guardian consented to irrigation of canals bilaterally.  Bilateral canals irrigated. Patient could not tolerate continued irrigation.  Cerumen impaction reduced but remains. Discussed Debrox drops OTC.  Discussed home care instructions.     Dementia with behavioral disturbance (HCC) Assessment & Plan: Stable.  There are continued signs of gradual decline including weight loss, recurrent falls.  Reviewed neurology notes from September 2024.  Continue Zoloft 200 mg at bedtime, Risperdal 2 mg at bedtime, memantine XR 28 mg daily, Aricept 10 mg daily. Referral placed for home health PT and nursing.   Overactive bladder Assessment & Plan: Stable per daughter.  Continue off treatment.   Hyperlipidemia, unspecified hyperlipidemia type Assessment & Plan: Repeat lipid panel pending.  Continue atorvastatin 40 mg daily.  Orders: -     Lipid panel  Prediabetes Assessment & Plan: Repeat A1c pending.  Orders: -     Hemoglobin A1c  Pressure injury of sacral region, stage 2 [facility]) Assessment & Plan: Referral placed for home health nursing.  Orders: -     Ambulatory referral to Home Health  Recurrent falls Assessment & Plan: Without injury.  Referral placed to home health physical therapy.  Orders: -     Ambulatory referral to Home Health        Doreene Nest, NP

## 2023-06-21 NOTE — Assessment & Plan Note (Signed)
Without injury.  Referral placed to home health physical therapy.

## 2023-06-21 NOTE — Assessment & Plan Note (Signed)
Controlled.  Continue fiber supplement daily.

## 2023-06-21 NOTE — Assessment & Plan Note (Addendum)
Bilateral cerumen impaction identified on exam. Patient's guardian consented to irrigation of canals bilaterally.  Bilateral canals irrigated. Patient could not tolerate continued irrigation.  Cerumen impaction reduced but remains. Discussed Debrox drops OTC.  Discussed home care instructions.

## 2023-06-21 NOTE — Assessment & Plan Note (Signed)
Controlled.   Continue omeprazole 20 mg daily. 

## 2023-06-23 DIAGNOSIS — G4730 Sleep apnea, unspecified: Secondary | ICD-10-CM | POA: Diagnosis not present

## 2023-06-23 DIAGNOSIS — R296 Repeated falls: Secondary | ICD-10-CM | POA: Diagnosis not present

## 2023-06-23 DIAGNOSIS — Z853 Personal history of malignant neoplasm of breast: Secondary | ICD-10-CM | POA: Diagnosis not present

## 2023-06-23 DIAGNOSIS — E785 Hyperlipidemia, unspecified: Secondary | ICD-10-CM | POA: Diagnosis not present

## 2023-06-23 DIAGNOSIS — K5909 Other constipation: Secondary | ICD-10-CM | POA: Diagnosis not present

## 2023-06-23 DIAGNOSIS — Z8619 Personal history of other infectious and parasitic diseases: Secondary | ICD-10-CM | POA: Diagnosis not present

## 2023-06-23 DIAGNOSIS — R2681 Unsteadiness on feet: Secondary | ICD-10-CM | POA: Diagnosis not present

## 2023-06-23 DIAGNOSIS — Z9842 Cataract extraction status, left eye: Secondary | ICD-10-CM | POA: Diagnosis not present

## 2023-06-23 DIAGNOSIS — R7303 Prediabetes: Secondary | ICD-10-CM | POA: Diagnosis not present

## 2023-06-23 DIAGNOSIS — Z9181 History of falling: Secondary | ICD-10-CM | POA: Diagnosis not present

## 2023-06-23 DIAGNOSIS — L89312 Pressure ulcer of right buttock, stage 2: Secondary | ICD-10-CM | POA: Diagnosis not present

## 2023-06-23 DIAGNOSIS — Z90710 Acquired absence of both cervix and uterus: Secondary | ICD-10-CM | POA: Diagnosis not present

## 2023-06-23 DIAGNOSIS — Z9841 Cataract extraction status, right eye: Secondary | ICD-10-CM | POA: Diagnosis not present

## 2023-06-23 DIAGNOSIS — I890 Lymphedema, not elsewhere classified: Secondary | ICD-10-CM | POA: Diagnosis not present

## 2023-06-23 DIAGNOSIS — N3281 Overactive bladder: Secondary | ICD-10-CM | POA: Diagnosis not present

## 2023-06-23 DIAGNOSIS — Z7982 Long term (current) use of aspirin: Secondary | ICD-10-CM | POA: Diagnosis not present

## 2023-06-23 DIAGNOSIS — Z556 Problems related to health literacy: Secondary | ICD-10-CM | POA: Diagnosis not present

## 2023-06-23 DIAGNOSIS — G309 Alzheimer's disease, unspecified: Secondary | ICD-10-CM | POA: Diagnosis not present

## 2023-06-23 DIAGNOSIS — H409 Unspecified glaucoma: Secondary | ICD-10-CM | POA: Diagnosis not present

## 2023-06-23 DIAGNOSIS — Z8701 Personal history of pneumonia (recurrent): Secondary | ICD-10-CM | POA: Diagnosis not present

## 2023-06-23 DIAGNOSIS — F02818 Dementia in other diseases classified elsewhere, unspecified severity, with other behavioral disturbance: Secondary | ICD-10-CM | POA: Diagnosis not present

## 2023-06-23 DIAGNOSIS — H6123 Impacted cerumen, bilateral: Secondary | ICD-10-CM | POA: Diagnosis not present

## 2023-06-23 DIAGNOSIS — Z7951 Long term (current) use of inhaled steroids: Secondary | ICD-10-CM | POA: Diagnosis not present

## 2023-06-23 DIAGNOSIS — K219 Gastro-esophageal reflux disease without esophagitis: Secondary | ICD-10-CM | POA: Diagnosis not present

## 2023-06-23 DIAGNOSIS — I10 Essential (primary) hypertension: Secondary | ICD-10-CM | POA: Diagnosis not present

## 2023-06-25 ENCOUNTER — Other Ambulatory Visit: Payer: Self-pay | Admitting: Primary Care

## 2023-06-25 DIAGNOSIS — K219 Gastro-esophageal reflux disease without esophagitis: Secondary | ICD-10-CM

## 2023-06-30 DIAGNOSIS — F02818 Dementia in other diseases classified elsewhere, unspecified severity, with other behavioral disturbance: Secondary | ICD-10-CM | POA: Diagnosis not present

## 2023-06-30 DIAGNOSIS — K5909 Other constipation: Secondary | ICD-10-CM | POA: Diagnosis not present

## 2023-06-30 DIAGNOSIS — R2681 Unsteadiness on feet: Secondary | ICD-10-CM | POA: Diagnosis not present

## 2023-06-30 DIAGNOSIS — L89312 Pressure ulcer of right buttock, stage 2: Secondary | ICD-10-CM | POA: Diagnosis not present

## 2023-06-30 DIAGNOSIS — G309 Alzheimer's disease, unspecified: Secondary | ICD-10-CM | POA: Diagnosis not present

## 2023-06-30 DIAGNOSIS — I10 Essential (primary) hypertension: Secondary | ICD-10-CM | POA: Diagnosis not present

## 2023-07-04 DIAGNOSIS — G309 Alzheimer's disease, unspecified: Secondary | ICD-10-CM | POA: Diagnosis not present

## 2023-07-04 DIAGNOSIS — R2681 Unsteadiness on feet: Secondary | ICD-10-CM | POA: Diagnosis not present

## 2023-07-04 DIAGNOSIS — K5909 Other constipation: Secondary | ICD-10-CM | POA: Diagnosis not present

## 2023-07-04 DIAGNOSIS — F02818 Dementia in other diseases classified elsewhere, unspecified severity, with other behavioral disturbance: Secondary | ICD-10-CM | POA: Diagnosis not present

## 2023-07-04 DIAGNOSIS — L89312 Pressure ulcer of right buttock, stage 2: Secondary | ICD-10-CM | POA: Diagnosis not present

## 2023-07-04 DIAGNOSIS — I10 Essential (primary) hypertension: Secondary | ICD-10-CM | POA: Diagnosis not present

## 2023-07-06 DIAGNOSIS — G309 Alzheimer's disease, unspecified: Secondary | ICD-10-CM | POA: Diagnosis not present

## 2023-07-06 DIAGNOSIS — R2681 Unsteadiness on feet: Secondary | ICD-10-CM | POA: Diagnosis not present

## 2023-07-06 DIAGNOSIS — I10 Essential (primary) hypertension: Secondary | ICD-10-CM | POA: Diagnosis not present

## 2023-07-06 DIAGNOSIS — L89312 Pressure ulcer of right buttock, stage 2: Secondary | ICD-10-CM | POA: Diagnosis not present

## 2023-07-06 DIAGNOSIS — F02818 Dementia in other diseases classified elsewhere, unspecified severity, with other behavioral disturbance: Secondary | ICD-10-CM | POA: Diagnosis not present

## 2023-07-06 DIAGNOSIS — K5909 Other constipation: Secondary | ICD-10-CM | POA: Diagnosis not present

## 2023-07-07 DIAGNOSIS — L89312 Pressure ulcer of right buttock, stage 2: Secondary | ICD-10-CM | POA: Diagnosis not present

## 2023-07-07 DIAGNOSIS — G309 Alzheimer's disease, unspecified: Secondary | ICD-10-CM | POA: Diagnosis not present

## 2023-07-07 DIAGNOSIS — F02818 Dementia in other diseases classified elsewhere, unspecified severity, with other behavioral disturbance: Secondary | ICD-10-CM | POA: Diagnosis not present

## 2023-07-07 DIAGNOSIS — R2681 Unsteadiness on feet: Secondary | ICD-10-CM | POA: Diagnosis not present

## 2023-07-07 DIAGNOSIS — K5909 Other constipation: Secondary | ICD-10-CM | POA: Diagnosis not present

## 2023-07-07 DIAGNOSIS — I10 Essential (primary) hypertension: Secondary | ICD-10-CM | POA: Diagnosis not present

## 2023-07-13 DIAGNOSIS — L89312 Pressure ulcer of right buttock, stage 2: Secondary | ICD-10-CM | POA: Diagnosis not present

## 2023-07-13 DIAGNOSIS — I10 Essential (primary) hypertension: Secondary | ICD-10-CM | POA: Diagnosis not present

## 2023-07-13 DIAGNOSIS — K5909 Other constipation: Secondary | ICD-10-CM | POA: Diagnosis not present

## 2023-07-13 DIAGNOSIS — F02818 Dementia in other diseases classified elsewhere, unspecified severity, with other behavioral disturbance: Secondary | ICD-10-CM | POA: Diagnosis not present

## 2023-07-13 DIAGNOSIS — R2681 Unsteadiness on feet: Secondary | ICD-10-CM | POA: Diagnosis not present

## 2023-07-13 DIAGNOSIS — G309 Alzheimer's disease, unspecified: Secondary | ICD-10-CM | POA: Diagnosis not present

## 2023-07-20 DIAGNOSIS — G309 Alzheimer's disease, unspecified: Secondary | ICD-10-CM | POA: Diagnosis not present

## 2023-07-20 DIAGNOSIS — F02818 Dementia in other diseases classified elsewhere, unspecified severity, with other behavioral disturbance: Secondary | ICD-10-CM | POA: Diagnosis not present

## 2023-07-20 DIAGNOSIS — R2681 Unsteadiness on feet: Secondary | ICD-10-CM | POA: Diagnosis not present

## 2023-07-20 DIAGNOSIS — K5909 Other constipation: Secondary | ICD-10-CM | POA: Diagnosis not present

## 2023-07-20 DIAGNOSIS — L89312 Pressure ulcer of right buttock, stage 2: Secondary | ICD-10-CM | POA: Diagnosis not present

## 2023-07-20 DIAGNOSIS — I10 Essential (primary) hypertension: Secondary | ICD-10-CM | POA: Diagnosis not present

## 2023-07-21 DIAGNOSIS — K5909 Other constipation: Secondary | ICD-10-CM | POA: Diagnosis not present

## 2023-07-21 DIAGNOSIS — I10 Essential (primary) hypertension: Secondary | ICD-10-CM | POA: Diagnosis not present

## 2023-07-21 DIAGNOSIS — L89312 Pressure ulcer of right buttock, stage 2: Secondary | ICD-10-CM | POA: Diagnosis not present

## 2023-07-21 DIAGNOSIS — F02818 Dementia in other diseases classified elsewhere, unspecified severity, with other behavioral disturbance: Secondary | ICD-10-CM | POA: Diagnosis not present

## 2023-07-21 DIAGNOSIS — G309 Alzheimer's disease, unspecified: Secondary | ICD-10-CM | POA: Diagnosis not present

## 2023-07-21 DIAGNOSIS — R2681 Unsteadiness on feet: Secondary | ICD-10-CM | POA: Diagnosis not present

## 2023-07-23 DIAGNOSIS — N3281 Overactive bladder: Secondary | ICD-10-CM | POA: Diagnosis not present

## 2023-07-23 DIAGNOSIS — G4730 Sleep apnea, unspecified: Secondary | ICD-10-CM | POA: Diagnosis not present

## 2023-07-23 DIAGNOSIS — Z7982 Long term (current) use of aspirin: Secondary | ICD-10-CM | POA: Diagnosis not present

## 2023-07-23 DIAGNOSIS — K5909 Other constipation: Secondary | ICD-10-CM | POA: Diagnosis not present

## 2023-07-23 DIAGNOSIS — Z8619 Personal history of other infectious and parasitic diseases: Secondary | ICD-10-CM | POA: Diagnosis not present

## 2023-07-23 DIAGNOSIS — Z9841 Cataract extraction status, right eye: Secondary | ICD-10-CM | POA: Diagnosis not present

## 2023-07-23 DIAGNOSIS — Z556 Problems related to health literacy: Secondary | ICD-10-CM | POA: Diagnosis not present

## 2023-07-23 DIAGNOSIS — H6123 Impacted cerumen, bilateral: Secondary | ICD-10-CM | POA: Diagnosis not present

## 2023-07-23 DIAGNOSIS — Z90710 Acquired absence of both cervix and uterus: Secondary | ICD-10-CM | POA: Diagnosis not present

## 2023-07-23 DIAGNOSIS — H409 Unspecified glaucoma: Secondary | ICD-10-CM | POA: Diagnosis not present

## 2023-07-23 DIAGNOSIS — I890 Lymphedema, not elsewhere classified: Secondary | ICD-10-CM | POA: Diagnosis not present

## 2023-07-23 DIAGNOSIS — Z853 Personal history of malignant neoplasm of breast: Secondary | ICD-10-CM | POA: Diagnosis not present

## 2023-07-23 DIAGNOSIS — R7303 Prediabetes: Secondary | ICD-10-CM | POA: Diagnosis not present

## 2023-07-23 DIAGNOSIS — G309 Alzheimer's disease, unspecified: Secondary | ICD-10-CM | POA: Diagnosis not present

## 2023-07-23 DIAGNOSIS — R296 Repeated falls: Secondary | ICD-10-CM | POA: Diagnosis not present

## 2023-07-23 DIAGNOSIS — Z9181 History of falling: Secondary | ICD-10-CM | POA: Diagnosis not present

## 2023-07-23 DIAGNOSIS — E785 Hyperlipidemia, unspecified: Secondary | ICD-10-CM | POA: Diagnosis not present

## 2023-07-23 DIAGNOSIS — Z8701 Personal history of pneumonia (recurrent): Secondary | ICD-10-CM | POA: Diagnosis not present

## 2023-07-23 DIAGNOSIS — L89312 Pressure ulcer of right buttock, stage 2: Secondary | ICD-10-CM | POA: Diagnosis not present

## 2023-07-23 DIAGNOSIS — Z7951 Long term (current) use of inhaled steroids: Secondary | ICD-10-CM | POA: Diagnosis not present

## 2023-07-23 DIAGNOSIS — F02818 Dementia in other diseases classified elsewhere, unspecified severity, with other behavioral disturbance: Secondary | ICD-10-CM | POA: Diagnosis not present

## 2023-07-23 DIAGNOSIS — R2681 Unsteadiness on feet: Secondary | ICD-10-CM | POA: Diagnosis not present

## 2023-07-23 DIAGNOSIS — K219 Gastro-esophageal reflux disease without esophagitis: Secondary | ICD-10-CM | POA: Diagnosis not present

## 2023-07-23 DIAGNOSIS — I10 Essential (primary) hypertension: Secondary | ICD-10-CM | POA: Diagnosis not present

## 2023-07-23 DIAGNOSIS — Z9842 Cataract extraction status, left eye: Secondary | ICD-10-CM | POA: Diagnosis not present

## 2023-07-26 ENCOUNTER — Telehealth: Payer: Self-pay | Admitting: Primary Care

## 2023-07-26 DIAGNOSIS — G309 Alzheimer's disease, unspecified: Secondary | ICD-10-CM | POA: Diagnosis not present

## 2023-07-26 DIAGNOSIS — F02818 Dementia in other diseases classified elsewhere, unspecified severity, with other behavioral disturbance: Secondary | ICD-10-CM | POA: Diagnosis not present

## 2023-07-26 DIAGNOSIS — K5909 Other constipation: Secondary | ICD-10-CM | POA: Diagnosis not present

## 2023-07-26 DIAGNOSIS — I10 Essential (primary) hypertension: Secondary | ICD-10-CM | POA: Diagnosis not present

## 2023-07-26 DIAGNOSIS — R2681 Unsteadiness on feet: Secondary | ICD-10-CM | POA: Diagnosis not present

## 2023-07-26 DIAGNOSIS — L89312 Pressure ulcer of right buttock, stage 2: Secondary | ICD-10-CM | POA: Diagnosis not present

## 2023-07-26 NOTE — Telephone Encounter (Signed)
Called and advised Judeth Cornfield with [facility] of the approval of the requested verbal orders for this patient. Advised to call back with any further questions.

## 2023-07-26 NOTE — Telephone Encounter (Signed)
Approved.  

## 2023-07-26 NOTE — Telephone Encounter (Signed)
Home Health verbal orders Caller Name: Judeth Cornfield, [place] Agency Name: [facility]  Callback number: [phone]  Requesting OT/PT/Skilled nursing/Social Work/Speech: OT  Reason: Increase mobility for feeding, bathing and independence   Frequency: 1wk 1, 2wk 2, 1wk 1  Please forward to [facility] pool or providers CMA

## 2023-08-01 DIAGNOSIS — F02818 Dementia in other diseases classified elsewhere, unspecified severity, with other behavioral disturbance: Secondary | ICD-10-CM | POA: Diagnosis not present

## 2023-08-01 DIAGNOSIS — K5909 Other constipation: Secondary | ICD-10-CM | POA: Diagnosis not present

## 2023-08-01 DIAGNOSIS — R2681 Unsteadiness on feet: Secondary | ICD-10-CM | POA: Diagnosis not present

## 2023-08-01 DIAGNOSIS — L89312 Pressure ulcer of right buttock, stage 2: Secondary | ICD-10-CM | POA: Diagnosis not present

## 2023-08-01 DIAGNOSIS — G309 Alzheimer's disease, unspecified: Secondary | ICD-10-CM | POA: Diagnosis not present

## 2023-08-01 DIAGNOSIS — I10 Essential (primary) hypertension: Secondary | ICD-10-CM | POA: Diagnosis not present

## 2023-08-03 DIAGNOSIS — F02818 Dementia in other diseases classified elsewhere, unspecified severity, with other behavioral disturbance: Secondary | ICD-10-CM | POA: Diagnosis not present

## 2023-08-03 DIAGNOSIS — L89312 Pressure ulcer of right buttock, stage 2: Secondary | ICD-10-CM | POA: Diagnosis not present

## 2023-08-03 DIAGNOSIS — K5909 Other constipation: Secondary | ICD-10-CM | POA: Diagnosis not present

## 2023-08-03 DIAGNOSIS — G309 Alzheimer's disease, unspecified: Secondary | ICD-10-CM | POA: Diagnosis not present

## 2023-08-03 DIAGNOSIS — R2681 Unsteadiness on feet: Secondary | ICD-10-CM | POA: Diagnosis not present

## 2023-08-03 DIAGNOSIS — I10 Essential (primary) hypertension: Secondary | ICD-10-CM | POA: Diagnosis not present

## 2023-08-04 DIAGNOSIS — I10 Essential (primary) hypertension: Secondary | ICD-10-CM | POA: Diagnosis not present

## 2023-08-04 DIAGNOSIS — G309 Alzheimer's disease, unspecified: Secondary | ICD-10-CM | POA: Diagnosis not present

## 2023-08-04 DIAGNOSIS — R2681 Unsteadiness on feet: Secondary | ICD-10-CM | POA: Diagnosis not present

## 2023-08-04 DIAGNOSIS — F02818 Dementia in other diseases classified elsewhere, unspecified severity, with other behavioral disturbance: Secondary | ICD-10-CM | POA: Diagnosis not present

## 2023-08-04 DIAGNOSIS — K5909 Other constipation: Secondary | ICD-10-CM | POA: Diagnosis not present

## 2023-08-04 DIAGNOSIS — L89312 Pressure ulcer of right buttock, stage 2: Secondary | ICD-10-CM | POA: Diagnosis not present

## 2023-08-08 DIAGNOSIS — K5909 Other constipation: Secondary | ICD-10-CM | POA: Diagnosis not present

## 2023-08-08 DIAGNOSIS — G309 Alzheimer's disease, unspecified: Secondary | ICD-10-CM | POA: Diagnosis not present

## 2023-08-08 DIAGNOSIS — R2681 Unsteadiness on feet: Secondary | ICD-10-CM | POA: Diagnosis not present

## 2023-08-08 DIAGNOSIS — F02818 Dementia in other diseases classified elsewhere, unspecified severity, with other behavioral disturbance: Secondary | ICD-10-CM | POA: Diagnosis not present

## 2023-08-08 DIAGNOSIS — I10 Essential (primary) hypertension: Secondary | ICD-10-CM | POA: Diagnosis not present

## 2023-08-08 DIAGNOSIS — L89312 Pressure ulcer of right buttock, stage 2: Secondary | ICD-10-CM | POA: Diagnosis not present

## 2023-08-10 DIAGNOSIS — I10 Essential (primary) hypertension: Secondary | ICD-10-CM | POA: Diagnosis not present

## 2023-08-10 DIAGNOSIS — L89312 Pressure ulcer of right buttock, stage 2: Secondary | ICD-10-CM | POA: Diagnosis not present

## 2023-08-10 DIAGNOSIS — F02818 Dementia in other diseases classified elsewhere, unspecified severity, with other behavioral disturbance: Secondary | ICD-10-CM | POA: Diagnosis not present

## 2023-08-10 DIAGNOSIS — G309 Alzheimer's disease, unspecified: Secondary | ICD-10-CM | POA: Diagnosis not present

## 2023-08-10 DIAGNOSIS — K5909 Other constipation: Secondary | ICD-10-CM | POA: Diagnosis not present

## 2023-08-10 DIAGNOSIS — R2681 Unsteadiness on feet: Secondary | ICD-10-CM | POA: Diagnosis not present

## 2023-08-17 DIAGNOSIS — F02818 Dementia in other diseases classified elsewhere, unspecified severity, with other behavioral disturbance: Secondary | ICD-10-CM | POA: Diagnosis not present

## 2023-08-17 DIAGNOSIS — R2681 Unsteadiness on feet: Secondary | ICD-10-CM | POA: Diagnosis not present

## 2023-08-17 DIAGNOSIS — K5909 Other constipation: Secondary | ICD-10-CM | POA: Diagnosis not present

## 2023-08-17 DIAGNOSIS — L89312 Pressure ulcer of right buttock, stage 2: Secondary | ICD-10-CM | POA: Diagnosis not present

## 2023-08-17 DIAGNOSIS — I10 Essential (primary) hypertension: Secondary | ICD-10-CM | POA: Diagnosis not present

## 2023-08-17 DIAGNOSIS — G309 Alzheimer's disease, unspecified: Secondary | ICD-10-CM | POA: Diagnosis not present

## 2023-08-19 DIAGNOSIS — K5909 Other constipation: Secondary | ICD-10-CM | POA: Diagnosis not present

## 2023-08-19 DIAGNOSIS — G309 Alzheimer's disease, unspecified: Secondary | ICD-10-CM | POA: Diagnosis not present

## 2023-08-19 DIAGNOSIS — R2681 Unsteadiness on feet: Secondary | ICD-10-CM | POA: Diagnosis not present

## 2023-08-19 DIAGNOSIS — F02818 Dementia in other diseases classified elsewhere, unspecified severity, with other behavioral disturbance: Secondary | ICD-10-CM | POA: Diagnosis not present

## 2023-08-19 DIAGNOSIS — L89312 Pressure ulcer of right buttock, stage 2: Secondary | ICD-10-CM | POA: Diagnosis not present

## 2023-08-19 DIAGNOSIS — I10 Essential (primary) hypertension: Secondary | ICD-10-CM | POA: Diagnosis not present

## 2023-09-03 ENCOUNTER — Other Ambulatory Visit: Payer: Self-pay | Admitting: Primary Care

## 2023-09-03 DIAGNOSIS — F418 Other specified anxiety disorders: Secondary | ICD-10-CM

## 2023-09-03 DIAGNOSIS — F03918 Unspecified dementia, unspecified severity, with other behavioral disturbance: Secondary | ICD-10-CM

## 2023-09-15 ENCOUNTER — Other Ambulatory Visit: Payer: Self-pay | Admitting: Primary Care

## 2023-09-15 DIAGNOSIS — E785 Hyperlipidemia, unspecified: Secondary | ICD-10-CM

## 2023-09-15 DIAGNOSIS — I10 Essential (primary) hypertension: Secondary | ICD-10-CM

## 2023-10-11 ENCOUNTER — Ambulatory Visit (INDEPENDENT_AMBULATORY_CARE_PROVIDER_SITE_OTHER): Payer: Medicare Other | Admitting: Family

## 2023-10-11 VITALS — BP 110/66 | HR 66 | Temp 98.6°F | Ht 68.0 in | Wt 155.8 lb

## 2023-10-11 DIAGNOSIS — R051 Acute cough: Secondary | ICD-10-CM | POA: Diagnosis not present

## 2023-10-11 DIAGNOSIS — H6123 Impacted cerumen, bilateral: Secondary | ICD-10-CM | POA: Diagnosis not present

## 2023-10-11 MED ORDER — BENZONATATE 200 MG PO CAPS
200.0000 mg | ORAL_CAPSULE | Freq: Two times a day (BID) | ORAL | 0 refills | Status: DC | PRN
Start: 2023-10-11 — End: 2024-05-16

## 2023-10-11 NOTE — Progress Notes (Signed)
 Established Patient Office Visit  Subjective:   Patient ID: Caroline French, female    DOB: [birth year]  Age: 88 y.o. MRN: [id number]  CC:  Chief Complaint  Patient presents with  . Cerumen Impaction    HPI: Caroline French is a 88 y.o. female presenting on 10/11/2023 for Cerumen Impaction  Daughter and aide accompanying her in the office today as pt with dementia, and daughter states noticed was hard of hearing and not responding well. She seems agitated because she isn't able to hear and she does frequently have ear impactions which require irrigation.   Has been coughing, dry but otherwise no sick symptoms. Not wheezing.         ROS: Negative unless specifically indicated above in HPI.   Relevant past medical history reviewed and updated as indicated.   Allergies and medications reviewed and updated.   Current Outpatient Medications:  .  aspirin  (ASPIRIN  LOW DOSE) 81 MG EC tablet, TAKE 1 TABLET BY MOUTH ONCE DAILY (Patient taking differently: Take 81 mg by mouth daily.), Disp: 90 tablet, Rfl: 3 .  atorvastatin  (LIPITOR) 40 MG tablet, TAKE 1 TABLET BY MOUTH EVERY DAY FOR CHOLESTEROL, Disp: 90 tablet, Rfl: 2 .  benzonatate  (TESSALON ) 200 MG capsule, Take 1 capsule (200 mg total) by mouth 2 (two) times daily as needed for cough., Disp: 20 capsule, Rfl: 0 .  Calcium  Citrate-Vitamin D3 315-6.25 MG-MCG TABS, Take 1 tablet by mouth daily., Disp: , Rfl:  .  docusate sodium  (COLACE) 100 MG capsule, Take 200 mg by mouth 2 (two) times daily., Disp: , Rfl:  .  donepezil  (ARICEPT ) 10 MG tablet, Take 1 tablet (10 mg total) by mouth at bedtime. For memory., Disp: 90 tablet, Rfl: 3 .  fluticasone  (FLONASE ) 50 MCG/ACT nasal spray, INSTILL 1 SPRAY IN EACH NOSTRIL TWICE DAILY AS NEEDED FOR ALLERGIES (Patient taking differently: Place 1 spray into both nostrils 2 (two) times daily as needed for allergies.), Disp: 48 g, Rfl: 1 .  hydrOXYzine  (ATARAX ) 10 MG tablet, TAKE 2 TABLETS (20 MG TOTAL) BY MOUTH AT  BEDTIME AS NEEDED FOR ANXIETY., Disp: 180 tablet, Rfl: 0 .  latanoprost  (XALATAN ) 0.005 % ophthalmic solution, Place 1 drop into both eyes at bedtime., Disp: , Rfl:  .  lisinopril  (ZESTRIL ) 5 MG tablet, TAKE 1 TABLET BY MOUTH EVERY DAY FOR BLOOD PRESSURE, Disp: 90 tablet, Rfl: 2 .  loratadine  (CLARITIN ) 10 MG tablet, Take 10 mg by mouth daily., Disp: , Rfl:  .  memantine  (NAMENDA  XR) 28 MG CP24 24 hr capsule, Take 28 mg by mouth daily., Disp: , Rfl:  .  Multiple Vitamin (MULTI-VITAMINS) TABS, Take 1 tablet by mouth daily., Disp: , Rfl: 11 .  omeprazole  (PRILOSEC) 20 MG capsule, TAKE 1 CAPSULE (20 MG TOTAL) BY MOUTH DAILY. FOR HEARTBURN, Disp: 90 capsule, Rfl: 3 .  polyethylene glycol powder (GLYCOLAX /MIRALAX ) 17 GM/SCOOP powder, Take 17 g by mouth daily. Pt is  taking at every day, Disp: , Rfl:  .  risperiDONE  (RISPERDAL ) 1 MG tablet, Take 2 mg by mouth at bedtime., Disp: , Rfl:  .  sertraline  (ZOLOFT ) 100 MG tablet, Take 200 mg by mouth daily with supper., Disp: , Rfl:   Allergies  Allergen Reactions  . Penicillins Rash    Did it involve swelling of the face/tongue/throat, SOB, or low BP? Yes Did it involve sudden or severe rash/hives, skin peeling, or any reaction on the inside of your mouth or nose? Unk Did you need to seek medical attention  at a hospital or doctor's office? Unk When did it last happen? I was young If all above answers are "NO", may proceed with cephalosporin use.     Objective:   BP 110/66 (BP Location: Right Arm, Patient Position: Sitting, Cuff Size: Normal)   Pulse 66   Temp 98.6 F (37 C) (Temporal)   Ht 5' 8 (1.727 m)   Wt 155 lb 12.8 oz (70.7 kg)   SpO2 100%   BMI 23.69 kg/m    Physical Exam Constitutional:      General: She is not in acute distress.    Appearance: Normal appearance. She is normal weight. She is not ill-appearing, toxic-appearing or diaphoretic.  HENT:     Head: Normocephalic.     Right Ear: Tympanic membrane normal. There is  impacted cerumen.     Left Ear: Tympanic membrane normal. There is impacted cerumen.     Nose: Nose normal.     Mouth/Throat:     Mouth: Mucous membranes are dry.     Pharynx: No oropharyngeal exudate or posterior oropharyngeal erythema.     Comments: Unable to fully visualize throat as pt did not respond to opening mouth  Eyes:     Extraocular Movements: Extraocular movements intact.     Pupils: Pupils are equal, round, and reactive to light.  Cardiovascular:     Rate and Rhythm: Normal rate and regular rhythm.     Pulses: Normal pulses.     Heart sounds: Normal heart sounds.  Pulmonary:     Effort: Pulmonary effort is normal.     Breath sounds: Normal breath sounds.     Comments: Limited ear movement due to pt not taking in appropriate breaths (severe dementia) Musculoskeletal:     Cervical back: Normal range of motion.  Neurological:     General: No focal deficit present.     Mental Status: She is alert and oriented to person, place, and time. Mental status is at baseline.  Psychiatric:        Mood and Affect: Mood normal.        Behavior: Behavior normal.        Thought Content: Thought content normal.        Judgment: Judgment normal.    Assessment & Plan:  Acute cough -     Benzonatate ; Take 1 capsule (200 mg total) by mouth 2 (two) times daily as needed for cough.  Dispense: 20 capsule; Refill: 0  Impacted cerumen of both ears  Bilateral impacted cerumen Assessment & Plan: bilateral Ceruminosis is noted.  Obtained verbal patient consent prior to procedure, possible risks of procedure discussed with pt prior, and then Wax was removed by syringing/irrigation and manual debridement was performed by me with curette. Instructions for home care to prevent wax buildup are given and handout provided to pt .pt tolerated procedure well.  Did discuss with daughter if noticing sob, worsening cough, fever then pt will need to be seen. They are worried about pneumonia but today no  real signs however limited physical exam due to severe dementia. Discussed red flag symptoms.       Follow up plan: Return for f/u PCP if no improvement in symptoms.  Ginger Patrick, FNP

## 2023-10-11 NOTE — Assessment & Plan Note (Signed)
 bilateral Ceruminosis is noted.  Obtained verbal patient consent prior to procedure, possible risks of procedure discussed with pt prior, and then Wax was removed by syringing/irrigation and manual debridement was performed by me with curette. Instructions for home care to prevent wax buildup are given and handout provided to pt .pt tolerated procedure well.  Did discuss with daughter if noticing sob, worsening cough, fever then pt will need to be seen. They are worried about pneumonia but today no real signs however limited physical exam due to severe dementia. Discussed red flag symptoms.

## 2023-12-03 ENCOUNTER — Other Ambulatory Visit: Payer: Self-pay | Admitting: Primary Care

## 2023-12-03 DIAGNOSIS — F418 Other specified anxiety disorders: Secondary | ICD-10-CM

## 2023-12-03 DIAGNOSIS — F03918 Unspecified dementia, unspecified severity, with other behavioral disturbance: Secondary | ICD-10-CM

## 2023-12-27 DIAGNOSIS — Z8659 Personal history of other mental and behavioral disorders: Secondary | ICD-10-CM | POA: Diagnosis not present

## 2023-12-27 DIAGNOSIS — R441 Visual hallucinations: Secondary | ICD-10-CM | POA: Diagnosis not present

## 2023-12-27 DIAGNOSIS — R413 Other amnesia: Secondary | ICD-10-CM | POA: Diagnosis not present

## 2023-12-27 DIAGNOSIS — F02A0 Dementia in other diseases classified elsewhere, mild, without behavioral disturbance, psychotic disturbance, mood disturbance, and anxiety: Secondary | ICD-10-CM | POA: Diagnosis not present

## 2023-12-27 DIAGNOSIS — F05 Delirium due to known physiological condition: Secondary | ICD-10-CM | POA: Diagnosis not present

## 2023-12-27 DIAGNOSIS — R296 Repeated falls: Secondary | ICD-10-CM | POA: Diagnosis not present

## 2023-12-27 DIAGNOSIS — F411 Generalized anxiety disorder: Secondary | ICD-10-CM | POA: Diagnosis not present

## 2023-12-27 DIAGNOSIS — E119 Type 2 diabetes mellitus without complications: Secondary | ICD-10-CM | POA: Diagnosis not present

## 2023-12-27 DIAGNOSIS — F514 Sleep terrors [night terrors]: Secondary | ICD-10-CM | POA: Diagnosis not present

## 2023-12-27 DIAGNOSIS — G301 Alzheimer's disease with late onset: Secondary | ICD-10-CM | POA: Diagnosis not present

## 2024-02-29 ENCOUNTER — Other Ambulatory Visit: Payer: Self-pay | Admitting: Primary Care

## 2024-02-29 DIAGNOSIS — F418 Other specified anxiety disorders: Secondary | ICD-10-CM

## 2024-02-29 DIAGNOSIS — F03918 Unspecified dementia, unspecified severity, with other behavioral disturbance: Secondary | ICD-10-CM

## 2024-03-12 DIAGNOSIS — H401133 Primary open-angle glaucoma, bilateral, severe stage: Secondary | ICD-10-CM | POA: Diagnosis not present

## 2024-03-26 ENCOUNTER — Telehealth: Payer: Self-pay | Admitting: Primary Care

## 2024-03-26 NOTE — Telephone Encounter (Signed)
 error

## 2024-04-04 ENCOUNTER — Ambulatory Visit: Payer: Medicare Other

## 2024-04-04 VITALS — Ht 68.0 in | Wt 155.0 lb

## 2024-04-04 DIAGNOSIS — Z0000 Encounter for general adult medical examination without abnormal findings: Secondary | ICD-10-CM

## 2024-04-04 NOTE — Patient Instructions (Signed)
 Caroline French , Thank you for taking time out of your busy schedule to complete your Annual Wellness Visit with me. I enjoyed our conversation and look forward to speaking with you again next year. I, as well as your care team,  appreciate your ongoing commitment to your health goals. Please review the following plan we discussed and let me know if I can assist you in the future. Your Game plan/ To Do List    Follow up Visits: Next Medicare AWV with our clinical staff: 04/05/25 @ 2:20pm televisit   Have you seen your provider in the last 6 months (3 months if uncontrolled diabetes)? No Next Office Visit with your provider: 05/10/24  Clinician Recommendations:  Aim for 30 minutes of walking, 4-6 glasses of water, and 5 servings of fruits and vegetables each day. Pt to continue chair exercises and stretching      This is a list of the screening recommended for you and due dates:  Health Maintenance  Topic Date Due   Zoster (Shingles) Vaccine (1 of 2) Never done   COVID-19 Vaccine (3 - Moderna risk series) 01/08/2020   Flu Shot  05/11/2024   Medicare Annual Wellness Visit  04/04/2025   Pneumococcal Vaccine for age over 18  Completed   DEXA scan (bone density measurement)  Completed   Hepatitis B Vaccine  Aged Out   HPV Vaccine  Aged Out   Meningitis B Vaccine  Aged Out   DTaP/Tdap/Td vaccine  Discontinued    Advanced directives: (Declined) Advance directive discussed with you today. Even though you declined this today, please call our office should you change your mind, and we can give you the proper paperwork for you to fill out. Advance Care Planning is important because it:  [x]  Makes sure you receive the medical care that is consistent with your values, goals, and preferences  [x]  It provides guidance to your family and loved ones and reduces their decisional burden about whether or not they are making the right decisions based on your wishes.  Follow the link provided in your after  visit summary or read over the paperwork we have mailed to you to help you started getting your Advance Directives in place. If you need assistance in completing these, please reach out to us  so that we can help you!

## 2024-04-04 NOTE — Progress Notes (Signed)
 Subjective:   Caroline French is a 89 y.o. who presents for a Medicare Wellness preventive visit.  As a reminder, Annual Wellness Visits don't include a physical exam, and some assessments may be limited, especially if this visit is performed virtually. We may recommend an in-person follow-up visit with your provider if needed.  Visit Complete: Virtual I connected with  Caroline French on 04/04/24 by a audio enabled telemedicine application and verified that I am speaking with the correct person using two identifiers.  Patient Location: Home  Provider Location: Home Office  I discussed the limitations of evaluation and management by telemedicine. The patient expressed understanding and agreed to proceed.  Vital Signs: Because this visit was a virtual/telehealth visit, some criteria may be missing or patient reported. Any vitals not documented were not able to be obtained and vitals that have been documented are patient reported.  VideoDeclined- This patient declined Librarian, academic. Therefore the visit was completed with audio only.  Persons Participating in Visit:  pt daughter Government social research officer (pt fell asleep)  AWV Questionnaire: No: Patient Medicare AWV questionnaire was not completed prior to this visit.  Cardiac Risk Factors include: advanced age (>59men, >28 women);dyslipidemia;hypertension;sedentary lifestyle    Objective:    Today's Vitals   04/04/24 1437  Weight: 155 lb (70.3 kg)  Height: 5' 8 (1.727 m)   Body mass index is 23.57 kg/m.     04/04/2024    3:01 PM 04/04/2023   11:21 AM 09/07/2022    3:18 PM 03/30/2022   11:19 AM 12/22/2021    9:08 PM 03/27/2021   11:48 AM 03/04/2020    9:47 AM  Advanced Directives  Does Patient Have a Medical Advance Directive? No No No Yes No No No  Type of Theme park manager;Living will     Copy of Healthcare Power of Attorney in Chart?    No - copy requested     Would patient  like information on creating a medical advance directive?  No - Patient declined   No - Patient declined No - Patient declined Yes (MAU/Ambulatory/Procedural Areas - Information given)    Current Medications (verified) Outpatient Encounter Medications as of 04/04/2024  Medication Sig   aspirin  (ASPIRIN  LOW DOSE) 81 MG EC tablet TAKE 1 TABLET BY MOUTH ONCE DAILY   atorvastatin  (LIPITOR) 40 MG tablet TAKE 1 TABLET BY MOUTH EVERY DAY FOR CHOLESTEROL   Calcium  Citrate-Vitamin D3 315-6.25 MG-MCG TABS Take 1 tablet by mouth daily.   donepezil  (ARICEPT ) 10 MG tablet Take 1 tablet (10 mg total) by mouth at bedtime. For memory.   fluticasone  (FLONASE ) 50 MCG/ACT nasal spray INSTILL 1 SPRAY IN EACH NOSTRIL TWICE DAILY AS NEEDED FOR ALLERGIES   hydrOXYzine  (ATARAX ) 10 MG tablet TAKE 2 TABLETS (20 MG TOTAL) BY MOUTH AT BEDTIME AS NEEDED FOR ANXIETY.   latanoprost  (XALATAN ) 0.005 % ophthalmic solution Place 1 drop into both eyes at bedtime.   lisinopril  (ZESTRIL ) 5 MG tablet TAKE 1 TABLET BY MOUTH EVERY DAY FOR BLOOD PRESSURE   loratadine  (CLARITIN ) 10 MG tablet Take 10 mg by mouth daily.   memantine  (NAMENDA ) 10 MG tablet Take 10 mg by mouth 2 (two) times daily.   Multiple Vitamin (MULTI-VITAMINS) TABS Take 1 tablet by mouth daily.   omeprazole  (PRILOSEC) 20 MG capsule TAKE 1 CAPSULE (20 MG TOTAL) BY MOUTH DAILY. FOR HEARTBURN   polyethylene glycol powder (GLYCOLAX /MIRALAX ) 17 GM/SCOOP powder Take 17 g by mouth daily. Pt is  taking at every day   risperiDONE  (RISPERDAL ) 1 MG tablet Take 2 mg by mouth at bedtime. (Patient taking differently: Take 1 mg by mouth at bedtime.)   sertraline  (ZOLOFT ) 100 MG tablet Take 200 mg by mouth daily with supper.   benzonatate  (TESSALON ) 200 MG capsule Take 1 capsule (200 mg total) by mouth 2 (two) times daily as needed for cough. (Patient not taking: Reported on 04/04/2024)   docusate sodium  (COLACE) 100 MG capsule Take 200 mg by mouth 2 (two) times daily. (Patient not  taking: Reported on 04/04/2024)   memantine  (NAMENDA  XR) 28 MG CP24 24 hr capsule Take 28 mg by mouth daily. (Patient not taking: Reported on 04/04/2024)   No facility-administered encounter medications on file as of 04/04/2024.    Allergies (verified) Penicillins   History: Past Medical History:  Diagnosis Date   Breast cancer (HCC) 1992's   left breast   CAP (community acquired pneumonia) 12/22/2021   Dementia with behavioral disturbance (HCC)    Diverticulitis    Essential hypertension    Glaucoma    Hallucinations    Overactive bladder    Sepsis (HCC) 12/23/2021   Past Surgical History:  Procedure Laterality Date   ABDOMINAL HYSTERECTOMY     BREAST BIOPSY     BREAST LUMPECTOMY Left 1990's   CATARACT EXTRACTION, BILATERAL  08/012020   No family history on file. Social History   Socioeconomic History   Marital status: Widowed    Spouse name: Not on file   Number of children: Not on file   Years of education: Not on file   Highest education level: Not on file  Occupational History   Not on file  Tobacco Use   Smoking status: Never   Smokeless tobacco: Never  Vaping Use   Vaping status: Never Used  Substance and Sexual Activity   Alcohol use: Never   Drug use: Never   Sexual activity: Not Currently  Other Topics Concern   Not on file  Social History Narrative   Widow.   Once worked in Programme researcher, broadcasting/film/video.   Moved from [place] .   Social Drivers of Corporate investment banker Strain: Low Risk  (04/04/2024)   Overall Financial Resource Strain (CARDIA)    Difficulty of Paying Living Expenses: Not hard at all  Food Insecurity: No Food Insecurity (04/04/2024)   Hunger Vital Sign    Worried About Running Out of Food in the Last Year: Never true    Ran Out of Food in the Last Year: Never true  Transportation Needs: No Transportation Needs (04/04/2024)   PRAPARE - Administrator, Civil Service (Medical): No    Lack of Transportation (Non-Medical): No   Physical Activity: Inactive (04/04/2024)   Exercise Vital Sign    Days of Exercise per Week: 0 days    Minutes of Exercise per Session: 0 min  Stress: No Stress Concern Present (04/04/2024)   [employer] of Occupational Health - Occupational Stress Questionnaire    Feeling of Stress: Not at all  Social Connections: Moderately Isolated (04/04/2024)   Social Connection and Isolation Panel    Frequency of Communication with Friends and Family: More than three times a week    Frequency of Social Gatherings with Friends and Family: Never    Attends Religious Services: Never    Database administrator or Organizations: No    Attends Banker Meetings: Never    Marital Status: Married    Tobacco Counseling Counseling given:  Not Answered    Clinical Intake:  Pre-visit preparation completed: Yes  Pain : No/denies pain     BMI - recorded: 23.57 Nutritional Status: BMI of 19-24  Normal Nutritional Risks: None Diabetes: No  Lab Results  Component Value Date   HGBA1C 6.0 06/21/2023   HGBA1C 6.3 01/12/2022   HGBA1C 6.2 02/19/2021           Comments: pt lives with daughter (caretaker) and son in law Information entered by :: B.Arthurine Oleary,LPN   Activities of Daily Living     04/04/2024    3:02 PM  In your present state of health, do you have any difficulty performing the following activities:  Hearing? 0  Vision? 0  Difficulty concentrating or making decisions? 1  Walking or climbing stairs? 1  Dressing or bathing? 1  Doing errands, shopping? 1  Preparing Food and eating ? Y  Using the Toilet? Y  In the past six months, have you accidently leaked urine? Y  Do you have problems with loss of bowel control? N  Managing your Medications? Y  Managing your Finances? Y  Housekeeping or managing your Housekeeping? Y    Patient Care Team: Gretta Comer POUR, NP as PCP - [facility]  I have updated your Care Teams  any recent Medical Services you may have received from other providers in the past year.     Assessment:   This is a routine wellness examination for Kajuana.  Hearing/Vision screen Hearing Screening - Comments:: Pt daughter says her hearing is good Vision Screening - Comments:: Pt daughter says her mother sees well [facility] with visits   Goals Addressed             This Visit's Progress    Patient Stated       04/04/24- I will maintain and continue medications as prescribed.     COMPLETED: Patient Stated       03/27/2021, I will continue to walk 3-4 days a week for 15 minutes     Patient Stated   On track    04/04/24-No new goals       Depression Screen     04/04/2024    2:54 PM 04/04/2023   11:19 AM 12/08/2022    1:02 PM 06/29/2022   12:45 PM 03/30/2022   11:16 AM 02/25/2022   11:23 AM 01/12/2022   11:36 AM  PHQ 2/9 Scores  PHQ - 2 Score 0 0 4  0 0 0  PHQ- 9 Score   10      Exception Documentation    Other- indicate reason in comment box     Not completed    patient not able to answer questions.       Fall Risk     04/04/2024    2:46 PM 06/21/2023   10:40 AM 04/04/2023   11:22 AM 02/17/2023    2:45 PM 12/13/2022    3:40 PM  Fall Risk   Falls in the past year? 0 1 1 1 1   Number falls in past yr: 0 0 1 0 1  Injury with Fall? 0 1 0 0 1  Risk for fall due to : Impaired mobility;Impaired balance/gait Impaired balance/gait;History of fall(s) No Fall Risks;Impaired balance/gait Impaired balance/gait;Impaired mobility History of fall(s)  Follow up Education provided;Falls prevention discussed Falls evaluation completed Falls prevention discussed;Falls evaluation completed Falls evaluation completed Falls evaluation completed    MEDICARE RISK AT HOME:  Medicare Risk at Home  Any stairs in or around the home?: Yes If so, are there any without handrails?: Yes Home free of loose throw rugs in walkways, pet beds, electrical cords, etc?: Yes Adequate lighting in  your home to reduce risk of falls?: Yes Life alert?: No Use of a cane, walker or w/c?: Yes Grab bars in the bathroom?: Yes Shower chair or bench in shower?: Yes Elevated toilet seat or a handicapped toilet?: Yes  TIMED UP AND GO:  Was the test performed?  No  Cognitive Function: Unable: Due to language barrier, hearing or vision limitations or other-pt fell asleep;pt w/dementia dx     04/04/2024    4:16 PM 03/27/2021   11:50 AM 03/04/2020    9:55 AM 01/24/2019    2:56 PM  MMSE - Mini Mental State Exam  Not completed: Unable to complete Unable to complete Unable to complete Unable to complete        04/04/2023   11:27 AM  6CIT Screen  What Year? 0 points  What month? 0 points  What time? 3 points  Count back from 20 0 points  Months in reverse 2 points  Repeat phrase 10 points  Total Score 15 points    Immunizations Immunization History  Administered Date(s) Administered   Fluad Quad(high Dose 65+) 08/11/2021, 06/29/2022   Influenza Split 08/03/2004, 07/18/2009, 06/23/2011   Influenza, High Dose Seasonal PF 06/26/2012, 08/13/2013, 07/05/2014, 08/26/2015, 08/26/2016, 09/09/2017, 08/27/2019   Influenza,inj,Quad PF,6+ Mos 07/21/2018   Moderna Sars-Covid-2 Vaccination 11/13/2019, 12/11/2019   PNEUMOCOCCAL CONJUGATE-20 06/21/2023   PPD Test 05/31/2018   Pneumococcal Conjugate-13 11/20/2013   Pneumococcal Polysaccharide-23 08/19/2011, 07/21/2018    Screening Tests Health Maintenance  Topic Date Due   Zoster Vaccines- Shingrix (1 of 2) Never done   COVID-19 Vaccine (3 - Moderna risk series) 01/08/2020   INFLUENZA VACCINE  05/11/2024   Medicare Annual Wellness (AWV)  04/04/2025   Pneumococcal Vaccine: 50+ Years  Completed   DEXA SCAN  Completed   Hepatitis B Vaccines  Aged Out   HPV VACCINES  Aged Out   Meningococcal B Vaccine  Aged Out   DTaP/Tdap/Td  Discontinued    Health Maintenance  Health Maintenance Due  Topic Date Due   Zoster Vaccines- Shingrix (1 of 2)  Never done   COVID-19 Vaccine (3 - Moderna risk series) 01/08/2020   Health Maintenance Items Addressed: None at this time  Additional Screening:  Vision Screening: Recommended annual ophthalmology exams for early detection of glaucoma and other disorders of the eye. Would you like a referral to an eye doctor? No    Dental Screening: Recommended annual dental exams for proper oral hygiene  Community Resource Referral / Chronic Care Management: CRR required this visit?  No   CCM required this visit?  Appt scheduled with PCP   Plan:    I have personally reviewed and noted the following in the patient's chart:   Medical and social history Use of alcohol, tobacco or illicit drugs  Current medications and supplements including opioid prescriptions. Patient is not currently taking opioid prescriptions. Functional ability and status Nutritional status Physical activity Advanced directives List of other physicians Hospitalizations, surgeries, and ER visits in previous 12 months Vitals Screenings to include cognitive, depression, and falls Referrals and appointments  In addition, I have reviewed and discussed with patient certain preventive protocols, quality metrics, and best practice recommendations. A written personalized care plan for preventive services as well as general preventive health recommendations were provided to patient.   Erminio  LITTIE Saris, LPN   [id number]   After Visit Summary: (MyChart) Due to this being a telephonic visit, the after visit summary with patients personalized plan was offered to patient via MyChart   Notes: Nothing significant to report at this time.

## 2024-04-18 ENCOUNTER — Ambulatory Visit: Payer: Self-pay

## 2024-04-18 NOTE — Telephone Encounter (Signed)
 Noted. Happy to evaluate if symptoms do not improve.

## 2024-04-18 NOTE — Telephone Encounter (Signed)
  FYI Only or Action Required?: FYI only for provider.  Patient was last seen in primary care on 10/11/2023 by Corwin Antu, FNP.  Called Nurse Triage reporting Cough.  Symptoms began several days ago.  Interventions attempted: Rest, hydration, or home remedies.- elevate HOB  Symptoms are: unchanged.  Triage Disposition: Home Care  Patient/caregiver understands and will follow disposition?: Yes    1. ONSET: When did the cough begin?  Three days ago 2. SEVERITY: How bad is the cough today?  On and off at night only 3. SPUTUM: Describe the color of your sputum (e.g., none, dry cough; clear, white, yellow, green) Non productive cough, no sputum 4. HEMOPTYSIS: Are you coughing up any blood? If Yes, ask: How much? (e.g., flecks, streaks, tablespoons, etc.) denies 5. DIFFICULTY BREATHING: Are you having difficulty breathing? If Yes, ask: How bad is it? (e.g., mild, moderate, severe)  - MILD: No SOB at rest, mild SOB with walking, speaks normally in sentences, can lie down, no retractions, pulse < 100.  - MODERATE: SOB at rest, SOB with minimal exertion and prefers to sit, cannot lie down flat, speaks in phrases, mild retractions, audible wheezing, pulse 100 to 120.  - SEVERE: Very SOB at rest, speaks in single words, struggling to breathe, sitting hunched forward, retractions, pulse > 120  Denies SOB associated with cough 6. FEVER: Do you have a fever? If Yes, ask: What is your temperature, how was it measured, and when did it start? denies 7. CARDIAC HISTORY: Do you have any history of heart disease? (e.g., heart attack, congestive heart failure)   8. LUNG HISTORY: Do you have any history of lung disease? (e.g., pulmonary embolus, asthma, emphysema) History of aspiration pneumonia in 2023 and 2024. Daughter states that they use strategies recommended by speech therapist and that she does not suspect aspiration   10. OTHER SYMPTOMS: Do you have any other  symptoms? (e.g., runny nose, wheezing, chest pain) denies  patient daughter Reena calling in, stating Reena is coughing at night, not so much during the day. No fever, no other symptoms.  Sharon's phone number [phone]   Reason for Disposition  Cough  Protocols used: Cough - Acute Non-Productive-A-AH

## 2024-04-23 ENCOUNTER — Ambulatory Visit: Payer: Self-pay

## 2024-04-23 NOTE — Telephone Encounter (Signed)
 Copied from CRM [id number]. Topic: Clinical - Pink Word Triage >> Apr 18, 2024 10:34 AM Caroline French wrote: Reason for Triage: patient daughter Caroline French calling in, stating Caroline French is coughing at night, not so much during the day. No fever, no other symptoms.    Sharon's phone number [phone] >> Apr 23, 2024  9:25 AM Caroline French wrote: Caroline French's cough is better but now her eyes are watering and red.  Caroline French wants to know what eye drops to use for Caroline French. Please call Caroline French at [phone]. Thanks >> Apr 18, 2024 10:38 AM Caroline French wrote: patient daughter Caroline French calling in, stating Caroline French is coughing at night, not so much during the day. No fever, no other symptoms.   Sharon's phone number [phone]

## 2024-04-23 NOTE — Telephone Encounter (Signed)
 FYI Only or Action Required?: FYI only for provider.  Patient was last seen in primary care on 10/11/2023 by Caroline Antu, FNP.  Called Nurse Triage reporting Eye Problem.  Symptoms began a week ago.  Interventions attempted: Nothing.  Symptoms are: unchanged.  Triage Disposition: No disposition on file.  Patient/caregiver understands and will follow disposition?:     Copied from CRM [id number]. Topic: Clinical - Pink Word Triage >> Apr 18, 2024 10:34 AM Caroline French wrote: Reason for Triage: patient daughter Caroline French calling in, stating Caroline French is coughing at night, not so much during the day. No fever, no other symptoms.    Caroline French's phone number [phone] >> Apr 23, 2024  9:25 AM Caroline French wrote: Caroline French's cough is better but now her eyes are watering and red.  Caroline French wants to know what eye drops to use for Caroline French. Please call Caroline French at [phone]. Thanks >> Apr 18, 2024 10:38 AM Caroline French wrote: patient daughter Caroline French calling in, stating Caroline French is coughing at night, not so much during the day. No fever, no other symptoms.   Caroline French's phone number [phone] Reason for Disposition  Small amount of discharge only in corner of eye  Answer Assessment - Initial Assessment Questions 1. EYE DISCHARGE: Is the discharge in one or both eyes? What color is it? How much is there? When did the discharge start?      Eyes watering, they were red 2. REDNESS OF SCLERA: Is there redness in the white of the eye? If Yes, ask: Is it in one or both eyes? When did the redness start?     yes 3. EYELIDS: Are the eyelids red or swollen? If Yes, ask: How much?      They were three or four days ago 4. VISION: Do you have blurred vision?     no 5. PAIN: Is there any pain? If Yes, ask: How bad is the pain? (Scale 0-10; or none, mild, moderate, severe)     no 6. CONTACT LENS: Do you wear contacts?     Reading glasses 7. OTHER SYMPTOMS: Do you have any other symptoms? (e.g., fever,  runny nose, cough)     cough 8. PREGNANCY: Is there any chance you are pregnant? When was your last menstrual period?     na  Protocols used: Eye - Pus or Discharge-A-AH

## 2024-04-23 NOTE — Telephone Encounter (Signed)
Called patients daughter and reviewed all information. She verbalized understanding. Will call if any further questions.  

## 2024-04-23 NOTE — Telephone Encounter (Signed)
 For itchy, red, watery eyes she can try allergy eye drops such as Pataday OTC. If this doesn't help then we can see her for an office visit.

## 2024-05-10 ENCOUNTER — Encounter: Admitting: Primary Care

## 2024-05-16 ENCOUNTER — Ambulatory Visit (INDEPENDENT_AMBULATORY_CARE_PROVIDER_SITE_OTHER): Admitting: Primary Care

## 2024-05-16 ENCOUNTER — Encounter: Payer: Self-pay | Admitting: Primary Care

## 2024-05-16 VITALS — BP 106/54 | HR 66 | Temp 97.2°F | Ht 68.0 in | Wt 144.0 lb

## 2024-05-16 DIAGNOSIS — F03918 Unspecified dementia, unspecified severity, with other behavioral disturbance: Secondary | ICD-10-CM

## 2024-05-16 DIAGNOSIS — R7303 Prediabetes: Secondary | ICD-10-CM

## 2024-05-16 DIAGNOSIS — I10 Essential (primary) hypertension: Secondary | ICD-10-CM | POA: Diagnosis not present

## 2024-05-16 DIAGNOSIS — K5909 Other constipation: Secondary | ICD-10-CM

## 2024-05-16 DIAGNOSIS — K219 Gastro-esophageal reflux disease without esophagitis: Secondary | ICD-10-CM | POA: Diagnosis not present

## 2024-05-16 DIAGNOSIS — E785 Hyperlipidemia, unspecified: Secondary | ICD-10-CM | POA: Diagnosis not present

## 2024-05-16 LAB — COMPREHENSIVE METABOLIC PANEL WITH GFR
ALT: 79 U/L — ABNORMAL HIGH (ref 0–35)
AST: 72 U/L — ABNORMAL HIGH (ref 0–37)
Albumin: 3.9 g/dL (ref 3.5–5.2)
Alkaline Phosphatase: 64 U/L (ref 39–117)
BUN: 12 mg/dL (ref 6–23)
CO2: 34 meq/L — ABNORMAL HIGH (ref 19–32)
Calcium: 9.4 mg/dL (ref 8.4–10.5)
Chloride: 105 meq/L (ref 96–112)
Creatinine, Ser: 1.16 mg/dL (ref 0.40–1.20)
GFR: 42.62 mL/min — ABNORMAL LOW (ref >60.00)
Glucose, Bld: 145 mg/dL — ABNORMAL HIGH (ref 70–99)
Potassium: 4.5 meq/L (ref 3.5–5.1)
Sodium: 143 meq/L (ref 135–145)
Total Bilirubin: 0.6 mg/dL (ref 0.2–1.2)
Total Protein: 7.2 g/dL (ref 6.0–8.3)

## 2024-05-16 LAB — LIPID PANEL
Cholesterol: 133 mg/dL (ref 0–200)
HDL: 46.8 mg/dL (ref >39.00)
LDL Cholesterol: 73 mg/dL (ref 0–99)
NonHDL: 85.94
Total CHOL/HDL Ratio: 3
Triglycerides: 63 mg/dL (ref 0.0–149.0)
VLDL: 12.6 mg/dL (ref 0.0–40.0)

## 2024-05-16 LAB — HEMOGLOBIN A1C: Hgb A1c MFr Bld: 6.1 % (ref 4.6–6.5)

## 2024-05-16 MED ORDER — OMEPRAZOLE 40 MG PO CPDR
40.0000 mg | DELAYED_RELEASE_CAPSULE | Freq: Every day | ORAL | 0 refills | Status: DC
Start: 2024-05-16 — End: 2024-08-14

## 2024-05-16 NOTE — Assessment & Plan Note (Signed)
 Possibly deteriorated.  Increase omeprazole  to 40 mg daily.  Her daughter will update.

## 2024-05-16 NOTE — Assessment & Plan Note (Signed)
 Repeat A1c pending

## 2024-05-16 NOTE — Assessment & Plan Note (Addendum)
 Stable.  Following with neurology.  Reviewed office notes from March 2025 through Care Everywhere.  Continue Aricept  10 mg daily, hydroxychloroquine 20 mg daily as needed, risperidone  1 to 2 mg at bedtime, Zoloft  200 mg daily. I have asked that her daughter clarify which Namenda  dose she is taking.  She will update when she gets home.  The patient, her daughter, and I had a discussion today about end-of-life decisions including DNR.  She and her family would like to discuss this together and report back.

## 2024-05-16 NOTE — Progress Notes (Signed)
 Subjective:    Patient ID: Caroline French, female    DOB: 11-17-1934, 89 y.o.   MRN: [id number]  HPI  Caroline French is a very pleasant 89 y.o. female with a history of hypertension, sleep apnea, chronic constipation, dementia with behavioral disturbance, AKI, overactive bladder, prediabetes who presents today for follow-up of chronic conditions.  Her daughter joins us  today who helps to provide HPI.   1) Dementia with Behavorial Disturbance: Currently managed on Aricept  10 mg at bedtime, memantine  XR 28 mg, risperidone  2 mg at bedtime, Zoloft  100 mg daily, hydroxyzine  10 mg PRN.  Following with neurology, last office visit was in March 2025. Her daughter discusses that she is slowing down overall. Poor coordination, moving slowly. She is sleeping throughout the night.   Her appetite is good, she can still feed herself but she has to be guided. She does have intermittent episodes of zoning out which occurs only with meals, has been chronic for years. She is managed on omeprazole  20 mg daily which helped initially, but her daughter thinks this is not as effective.   She does not have a current DNR on file.  2) Hypertension/Hyperlipidemia: Currently managed on atorvastatin  40 mg daily, lisinopril  5 mg daily, aspirin  81 mg daily. Her BP is not checked at home. She denies chest pain, shortness of breath.   BP Readings from Last 3 Encounters:  05/16/24 (!) 106/54  10/11/23 110/66  06/21/23 138/64   Wt Readings from Last 3 Encounters:  05/16/24 144 lb (65.3 kg)  04/04/24 155 lb (70.3 kg)  10/11/23 155 lb 12.8 oz (70.7 kg)     3) Chronic Constipation: Currently managed on MiraLAX  and Colace. Her bowels are moving regularly per her daughter. She denies abdominal bloating, abdominal pain.    Review of Systems  Respiratory:  Negative for shortness of breath.   Cardiovascular:  Negative for chest pain.  Gastrointestinal:  Negative for abdominal pain, constipation and diarrhea.  Neurological:   Negative for dizziness.  Psychiatric/Behavioral:  The patient is not nervous/anxious.          Past Medical History:  Diagnosis Date   Altered mental status 09/11/2019   Breast cancer [facility]) 1992's   left breast   CAP (community acquired pneumonia) 12/22/2021   Dementia with behavioral disturbance (HCC)    Diverticulitis    Essential hypertension    Generalized weakness 09/16/2022   Glaucoma    Hallucinations    Overactive bladder    Sepsis (HCC) 12/23/2021    Social History   Socioeconomic History   Marital status: Widowed    Spouse name: Not on file   Number of children: Not on file   Years of education: Not on file   Highest education level: Not on file  Occupational History   Not on file  Tobacco Use   Smoking status: Never   Smokeless tobacco: Never  Vaping Use   Vaping status: Never Used  Substance and Sexual Activity   Alcohol use: Never   Drug use: Never   Sexual activity: Not Currently  Other Topics Concern   Not on file  Social History Narrative   Widow.   Once worked in Programme researcher, broadcasting/film/video.   Moved from [place] .   Social Drivers of Health   Financial Resource Strain: Low Risk  (04/04/2024)   Overall Financial Resource Strain (CARDIA)    Difficulty of Paying Living Expenses: Not hard at all  Food Insecurity: No Food Insecurity (04/04/2024)   Hunger Vital Sign  Worried About Programme researcher, broadcasting/film/video in the Last Year: Never true    Ran Out of Food in the Last Year: Never true  Transportation Needs: No Transportation Needs (04/04/2024)   PRAPARE - Administrator, Civil Service (Medical): No    Lack of Transportation (Non-Medical): No  Physical Activity: Inactive (04/04/2024)   Exercise Vital Sign    Days of Exercise per Week: 0 days    Minutes of Exercise per Session: 0 min  Stress: No Stress Concern Present (04/04/2024)   [employer] of Occupational Health - Occupational Stress Questionnaire    Feeling of Stress: Not at all  Social  Connections: Moderately Isolated (04/04/2024)   Social Connection and Isolation Panel    Frequency of Communication with Friends and Family: More than three times a week    Frequency of Social Gatherings with Friends and Family: Never    Attends Religious Services: Never    Database administrator or Organizations: No    Attends Banker Meetings: Never    Marital Status: Married  Catering manager Violence: Not At Risk (04/04/2024)   Humiliation, Afraid, Rape, and Kick questionnaire    Fear of Current or Ex-Partner: No    Emotionally Abused: No    Physically Abused: No    Sexually Abused: No    Past Surgical History:  Procedure Laterality Date   ABDOMINAL HYSTERECTOMY     BREAST BIOPSY     BREAST LUMPECTOMY Left 1990's   CATARACT EXTRACTION, BILATERAL  08/012020    History reviewed. No pertinent family history.  Allergies  Allergen Reactions   Penicillins Rash    Did it involve swelling of the face/tongue/throat, SOB, or low BP? Yes Did it involve sudden or severe rash/hives, skin peeling, or any reaction on the inside of your mouth or nose? Unk Did you need to seek medical attention at a hospital or doctor's office? Unk When did it last happen? I was young If all above answers are "NO", may proceed with cephalosporin use.     Current Outpatient Medications on File Prior to Visit  Medication Sig Dispense Refill   aspirin  (ASPIRIN  LOW DOSE) 81 MG EC tablet TAKE 1 TABLET BY MOUTH ONCE DAILY 90 tablet 3   atorvastatin  (LIPITOR) 40 MG tablet TAKE 1 TABLET BY MOUTH EVERY DAY FOR CHOLESTEROL 90 tablet 2   Calcium  Citrate-Vitamin D3 315-6.25 MG-MCG TABS Take 1 tablet by mouth daily.     donepezil  (ARICEPT ) 10 MG tablet Take 1 tablet (10 mg total) by mouth at bedtime. For memory. 90 tablet 3   hydrOXYzine  (ATARAX ) 10 MG tablet TAKE 2 TABLETS (20 MG TOTAL) BY MOUTH AT BEDTIME AS NEEDED FOR ANXIETY. 180 tablet 0   latanoprost  (XALATAN ) 0.005 % ophthalmic solution Place 1  drop into both eyes at bedtime.     loratadine  (CLARITIN ) 10 MG tablet Take 10 mg by mouth daily.     memantine  (NAMENDA  XR) 28 MG CP24 24 hr capsule Take 28 mg by mouth daily.     memantine  (NAMENDA ) 10 MG tablet Take 10 mg by mouth 2 (two) times daily.     Multiple Vitamin (MULTI-VITAMINS) TABS Take 1 tablet by mouth daily.  11   polyethylene glycol powder (GLYCOLAX /MIRALAX ) 17 GM/SCOOP powder Take 17 g by mouth daily. Pt is  taking at every day     risperiDONE  (RISPERDAL ) 1 MG tablet Take 2 mg by mouth at bedtime. (Patient taking differently: Take 1 mg by mouth at bedtime.)  sertraline  (ZOLOFT ) 100 MG tablet Take 200 mg by mouth daily with supper.     docusate sodium  (COLACE) 100 MG capsule Take 200 mg by mouth 2 (two) times daily. (Patient not taking: Reported on 05/16/2024)     No current facility-administered medications on file prior to visit.    BP (!) 106/54   Pulse 66   Temp (!) 97.2 F (36.2 C) (Temporal)   Ht 5' 8 (1.727 m)   Wt 144 lb (65.3 kg)   SpO2 98%   BMI 21.90 kg/m  Objective:   Physical Exam Cardiovascular:     Rate and Rhythm: Normal rate and regular rhythm.  Pulmonary:     Effort: Pulmonary effort is normal.     Breath sounds: Normal breath sounds.  Musculoskeletal:     Cervical back: Neck supple.  Skin:    General: Skin is warm and dry.  Neurological:     Mental Status: She is alert.     Comments: Answers questions, follows commands  Psychiatric:        Mood and Affect: Mood normal.           Assessment & Plan:  Hyperlipidemia, unspecified hyperlipidemia type Assessment & Plan: Continue atorvastatin  40 mg daily. Repeat lipid panel pending.  Orders: -     Lipid panel  Prediabetes Assessment & Plan: Repeat A1c pending.  Orders: -     Hemoglobin A1c  Essential hypertension Assessment & Plan: Too low today, also on recheck.  Given weight loss, coupled with low bleeding, will discontinue lisinopril  5 mg daily. Discussed this with  both patient and her daughter today.  Orders: -     Comprehensive metabolic panel with GFR  Gastroesophageal reflux disease, unspecified whether esophagitis present Assessment & Plan: Possibly deteriorated.  Increase omeprazole  to 40 mg daily.  Her daughter will update.  Orders: -     Omeprazole ; Take 1 capsule (40 mg total) by mouth daily. for heartburn.  Dispense: 90 capsule; Refill: 0  Chronic constipation Assessment & Plan: Controlled.  Continue MiraLAX  and Colace daily.   Dementia with behavioral disturbance (HCC) Assessment & Plan: Stable.  Following with neurology.  Reviewed office notes from March 2025 through Care Everywhere.  Continue Aricept  10 mg daily, hydroxychloroquine 20 mg daily as needed, risperidone  1 to 2 mg at bedtime, Zoloft  200 mg daily. I have asked that her daughter clarify which Namenda  dose she is taking.  She will update when she gets home.  The patient, her daughter, and I had a discussion today about end-of-life decisions including DNR.  She and her family would like to discuss this together and report back.         Rondarius Kadrmas K Starlette Thurow, NP

## 2024-05-16 NOTE — Patient Instructions (Addendum)
 Stop taking lisinopril  5 mg daily for blood pressure  Please clarify which Namenda  taking. Either Namenda  ER 28 mg daily or Namenda  10 mg twice daily.  Please also clarify if she is taking Risperdal  2 mg daily or 1 mg daily (1 or 2 pills daily).  Stop by the lab prior to leaving today. I will notify you of your results once received.   We increased the dose of your omeprazole  heartburn medication to 40 mg once daily.  It was a pleasure to see you today!

## 2024-05-16 NOTE — Assessment & Plan Note (Signed)
 Controlled.  Continue MiraLAX  and Colace daily.

## 2024-05-16 NOTE — Assessment & Plan Note (Signed)
Continue atorvastatin 40 mg daily. Repeat lipid panel pending. 

## 2024-05-16 NOTE — Assessment & Plan Note (Signed)
 Too low today, also on recheck.  Given weight loss, coupled with low bleeding, will discontinue lisinopril  5 mg daily. Discussed this with both patient and her daughter today.

## 2024-05-17 ENCOUNTER — Ambulatory Visit: Payer: Self-pay | Admitting: Primary Care

## 2024-05-17 DIAGNOSIS — R7989 Other specified abnormal findings of blood chemistry: Secondary | ICD-10-CM

## 2024-05-29 ENCOUNTER — Other Ambulatory Visit: Payer: Self-pay | Admitting: Primary Care

## 2024-05-29 DIAGNOSIS — F03918 Unspecified dementia, unspecified severity, with other behavioral disturbance: Secondary | ICD-10-CM

## 2024-05-29 DIAGNOSIS — F418 Other specified anxiety disorders: Secondary | ICD-10-CM

## 2024-06-06 ENCOUNTER — Other Ambulatory Visit (INDEPENDENT_AMBULATORY_CARE_PROVIDER_SITE_OTHER)

## 2024-06-06 DIAGNOSIS — R7989 Other specified abnormal findings of blood chemistry: Secondary | ICD-10-CM

## 2024-06-06 LAB — HEPATIC FUNCTION PANEL
ALT: 60 U/L — ABNORMAL HIGH (ref 0–35)
AST: 59 U/L — ABNORMAL HIGH (ref 0–37)
Albumin: 3.7 g/dL (ref 3.5–5.2)
Alkaline Phosphatase: 66 U/L (ref 39–117)
Bilirubin, Direct: 0.2 mg/dL (ref 0.0–0.3)
Total Bilirubin: 0.6 mg/dL (ref 0.2–1.2)
Total Protein: 7.2 g/dL (ref 6.0–8.3)

## 2024-06-06 LAB — CBC
HCT: 38.7 % (ref 36.0–46.0)
Hemoglobin: 12.7 g/dL (ref 12.0–15.0)
MCHC: 32.7 g/dL (ref 30.0–36.0)
MCV: 92.4 fl (ref 78.0–100.0)
Platelets: 168 K/uL (ref 150.0–400.0)
RBC: 4.19 Mil/uL (ref 3.87–5.11)
RDW: 15.6 % — ABNORMAL HIGH (ref 11.5–15.5)
WBC: 3.8 K/uL — ABNORMAL LOW (ref 4.0–10.5)

## 2024-06-07 ENCOUNTER — Ambulatory Visit: Payer: Self-pay | Admitting: Primary Care

## 2024-06-17 ENCOUNTER — Other Ambulatory Visit: Payer: Self-pay | Admitting: Primary Care

## 2024-06-17 DIAGNOSIS — I10 Essential (primary) hypertension: Secondary | ICD-10-CM

## 2024-06-17 DIAGNOSIS — E785 Hyperlipidemia, unspecified: Secondary | ICD-10-CM

## 2024-06-26 ENCOUNTER — Other Ambulatory Visit: Payer: Self-pay | Admitting: Primary Care

## 2024-06-26 DIAGNOSIS — R7989 Other specified abnormal findings of blood chemistry: Secondary | ICD-10-CM

## 2024-06-27 DIAGNOSIS — F03B18 Unspecified dementia, moderate, with other behavioral disturbance: Secondary | ICD-10-CM | POA: Diagnosis not present

## 2024-06-27 DIAGNOSIS — F514 Sleep terrors [night terrors]: Secondary | ICD-10-CM | POA: Diagnosis not present

## 2024-06-27 DIAGNOSIS — R441 Visual hallucinations: Secondary | ICD-10-CM | POA: Diagnosis not present

## 2024-06-27 DIAGNOSIS — Z8659 Personal history of other mental and behavioral disorders: Secondary | ICD-10-CM | POA: Diagnosis not present

## 2024-06-27 DIAGNOSIS — R413 Other amnesia: Secondary | ICD-10-CM | POA: Diagnosis not present

## 2024-06-27 DIAGNOSIS — R296 Repeated falls: Secondary | ICD-10-CM | POA: Diagnosis not present

## 2024-07-09 DIAGNOSIS — Z79899 Other long term (current) drug therapy: Secondary | ICD-10-CM | POA: Diagnosis not present

## 2024-07-09 DIAGNOSIS — R296 Repeated falls: Secondary | ICD-10-CM | POA: Diagnosis not present

## 2024-07-09 DIAGNOSIS — F03B4 Unspecified dementia, moderate, with anxiety: Secondary | ICD-10-CM | POA: Diagnosis not present

## 2024-07-09 DIAGNOSIS — F32A Depression, unspecified: Secondary | ICD-10-CM | POA: Diagnosis not present

## 2024-07-09 DIAGNOSIS — F03B2 Unspecified dementia, moderate, with psychotic disturbance: Secondary | ICD-10-CM | POA: Diagnosis not present

## 2024-07-09 DIAGNOSIS — H409 Unspecified glaucoma: Secondary | ICD-10-CM | POA: Diagnosis not present

## 2024-07-09 DIAGNOSIS — F03B3 Unspecified dementia, moderate, with mood disturbance: Secondary | ICD-10-CM | POA: Diagnosis not present

## 2024-07-09 DIAGNOSIS — Z7982 Long term (current) use of aspirin: Secondary | ICD-10-CM | POA: Diagnosis not present

## 2024-07-09 DIAGNOSIS — F03B18 Unspecified dementia, moderate, with other behavioral disturbance: Secondary | ICD-10-CM | POA: Diagnosis not present

## 2024-07-09 DIAGNOSIS — F514 Sleep terrors [night terrors]: Secondary | ICD-10-CM | POA: Diagnosis not present

## 2024-07-09 DIAGNOSIS — Z9181 History of falling: Secondary | ICD-10-CM | POA: Diagnosis not present

## 2024-07-09 DIAGNOSIS — F05 Delirium due to known physiological condition: Secondary | ICD-10-CM | POA: Diagnosis not present

## 2024-07-09 DIAGNOSIS — E785 Hyperlipidemia, unspecified: Secondary | ICD-10-CM | POA: Diagnosis not present

## 2024-07-10 ENCOUNTER — Telehealth: Payer: Self-pay

## 2024-07-10 DIAGNOSIS — F03B2 Unspecified dementia, moderate, with psychotic disturbance: Secondary | ICD-10-CM | POA: Diagnosis not present

## 2024-07-10 DIAGNOSIS — F03B3 Unspecified dementia, moderate, with mood disturbance: Secondary | ICD-10-CM | POA: Diagnosis not present

## 2024-07-10 DIAGNOSIS — F03B18 Unspecified dementia, moderate, with other behavioral disturbance: Secondary | ICD-10-CM | POA: Diagnosis not present

## 2024-07-10 DIAGNOSIS — F03B4 Unspecified dementia, moderate, with anxiety: Secondary | ICD-10-CM | POA: Diagnosis not present

## 2024-07-10 DIAGNOSIS — F32A Depression, unspecified: Secondary | ICD-10-CM | POA: Diagnosis not present

## 2024-07-10 DIAGNOSIS — F05 Delirium due to known physiological condition: Secondary | ICD-10-CM | POA: Diagnosis not present

## 2024-07-10 NOTE — Telephone Encounter (Signed)
 Called patient daughter on dpr. We have canceled lab appointment. I advised that Mallie is out of the office I am sending to a provider in office to have ok to change orders over to have home health to draw. Advised daughter we would reach out when we have addressed.

## 2024-07-10 NOTE — Telephone Encounter (Signed)
 Copied from CRM #[id number]. Topic: General - Other >> Jul 10, 2024 10:22 AM Burnard DEL wrote: Reason for CRM: Patients daughter Reena called in stating that her mom is schedule for lab for tomorrow October 1.However she is bed ridden now.She stated that [facility] come is to see her mom for nursing an PT.She would like to know if Mallie would like for them to draw the labs that she was suppose to have don eon tomorrow?

## 2024-07-11 ENCOUNTER — Other Ambulatory Visit

## 2024-07-11 DIAGNOSIS — Z66 Do not resuscitate: Secondary | ICD-10-CM | POA: Diagnosis not present

## 2024-07-11 DIAGNOSIS — N179 Acute kidney failure, unspecified: Secondary | ICD-10-CM | POA: Diagnosis not present

## 2024-07-11 DIAGNOSIS — F32A Depression, unspecified: Secondary | ICD-10-CM | POA: Diagnosis not present

## 2024-07-11 DIAGNOSIS — N3281 Overactive bladder: Secondary | ICD-10-CM | POA: Diagnosis not present

## 2024-07-11 DIAGNOSIS — I10 Essential (primary) hypertension: Secondary | ICD-10-CM | POA: Diagnosis not present

## 2024-07-11 DIAGNOSIS — K219 Gastro-esophageal reflux disease without esophagitis: Secondary | ICD-10-CM | POA: Diagnosis not present

## 2024-07-11 DIAGNOSIS — F514 Sleep terrors [night terrors]: Secondary | ICD-10-CM | POA: Diagnosis not present

## 2024-07-11 DIAGNOSIS — F03911 Unspecified dementia, unspecified severity, with agitation: Secondary | ICD-10-CM | POA: Diagnosis not present

## 2024-07-11 DIAGNOSIS — H409 Unspecified glaucoma: Secondary | ICD-10-CM | POA: Diagnosis not present

## 2024-07-11 DIAGNOSIS — F05 Delirium due to known physiological condition: Secondary | ICD-10-CM | POA: Diagnosis not present

## 2024-07-11 DIAGNOSIS — F03B3 Unspecified dementia, moderate, with mood disturbance: Secondary | ICD-10-CM | POA: Diagnosis not present

## 2024-07-11 DIAGNOSIS — Z853 Personal history of malignant neoplasm of breast: Secondary | ICD-10-CM | POA: Diagnosis not present

## 2024-07-11 DIAGNOSIS — F03B18 Unspecified dementia, moderate, with other behavioral disturbance: Secondary | ICD-10-CM | POA: Diagnosis not present

## 2024-07-11 DIAGNOSIS — G301 Alzheimer's disease with late onset: Secondary | ICD-10-CM | POA: Diagnosis not present

## 2024-07-11 DIAGNOSIS — E785 Hyperlipidemia, unspecified: Secondary | ICD-10-CM | POA: Diagnosis not present

## 2024-07-11 DIAGNOSIS — F03B2 Unspecified dementia, moderate, with psychotic disturbance: Secondary | ICD-10-CM | POA: Diagnosis not present

## 2024-07-11 DIAGNOSIS — D320 Benign neoplasm of cerebral meninges: Secondary | ICD-10-CM | POA: Diagnosis not present

## 2024-07-11 DIAGNOSIS — A419 Sepsis, unspecified organism: Secondary | ICD-10-CM | POA: Diagnosis not present

## 2024-07-11 DIAGNOSIS — K5732 Diverticulitis of large intestine without perforation or abscess without bleeding: Secondary | ICD-10-CM | POA: Diagnosis not present

## 2024-07-11 DIAGNOSIS — R443 Hallucinations, unspecified: Secondary | ICD-10-CM | POA: Diagnosis not present

## 2024-07-11 DIAGNOSIS — G4733 Obstructive sleep apnea (adult) (pediatric): Secondary | ICD-10-CM | POA: Diagnosis not present

## 2024-07-11 DIAGNOSIS — Z9181 History of falling: Secondary | ICD-10-CM | POA: Diagnosis not present

## 2024-07-11 DIAGNOSIS — K5901 Slow transit constipation: Secondary | ICD-10-CM | POA: Diagnosis not present

## 2024-07-11 DIAGNOSIS — F03B4 Unspecified dementia, moderate, with anxiety: Secondary | ICD-10-CM | POA: Diagnosis not present

## 2024-07-11 NOTE — Telephone Encounter (Signed)
 Is she on hospice care now? If so, then no need for lab draw.

## 2024-07-11 NOTE — Telephone Encounter (Signed)
 Called and spoke with patients daughter, states the hospice nurse is coming today to evaluate patient to see if she qualifies. She will update if patient does not go on hospice care for labs.

## 2024-07-13 DIAGNOSIS — F03911 Unspecified dementia, unspecified severity, with agitation: Secondary | ICD-10-CM | POA: Diagnosis not present

## 2024-07-13 DIAGNOSIS — G301 Alzheimer's disease with late onset: Secondary | ICD-10-CM | POA: Diagnosis not present

## 2024-07-13 DIAGNOSIS — K219 Gastro-esophageal reflux disease without esophagitis: Secondary | ICD-10-CM | POA: Diagnosis not present

## 2024-07-13 DIAGNOSIS — F514 Sleep terrors [night terrors]: Secondary | ICD-10-CM | POA: Diagnosis not present

## 2024-07-13 DIAGNOSIS — K5901 Slow transit constipation: Secondary | ICD-10-CM | POA: Diagnosis not present

## 2024-07-13 DIAGNOSIS — N179 Acute kidney failure, unspecified: Secondary | ICD-10-CM | POA: Diagnosis not present

## 2024-07-13 DIAGNOSIS — R441 Visual hallucinations: Secondary | ICD-10-CM | POA: Diagnosis not present

## 2024-07-13 DIAGNOSIS — D320 Benign neoplasm of cerebral meninges: Secondary | ICD-10-CM | POA: Diagnosis not present

## 2024-07-13 DIAGNOSIS — R413 Other amnesia: Secondary | ICD-10-CM | POA: Diagnosis not present

## 2024-07-15 DIAGNOSIS — K219 Gastro-esophageal reflux disease without esophagitis: Secondary | ICD-10-CM | POA: Diagnosis not present

## 2024-07-15 DIAGNOSIS — G301 Alzheimer's disease with late onset: Secondary | ICD-10-CM | POA: Diagnosis not present

## 2024-07-15 DIAGNOSIS — D320 Benign neoplasm of cerebral meninges: Secondary | ICD-10-CM | POA: Diagnosis not present

## 2024-07-15 DIAGNOSIS — N179 Acute kidney failure, unspecified: Secondary | ICD-10-CM | POA: Diagnosis not present

## 2024-07-15 DIAGNOSIS — F03911 Unspecified dementia, unspecified severity, with agitation: Secondary | ICD-10-CM | POA: Diagnosis not present

## 2024-07-15 DIAGNOSIS — K5901 Slow transit constipation: Secondary | ICD-10-CM | POA: Diagnosis not present

## 2024-07-16 DIAGNOSIS — N179 Acute kidney failure, unspecified: Secondary | ICD-10-CM | POA: Diagnosis not present

## 2024-07-16 DIAGNOSIS — D320 Benign neoplasm of cerebral meninges: Secondary | ICD-10-CM | POA: Diagnosis not present

## 2024-07-16 DIAGNOSIS — F03911 Unspecified dementia, unspecified severity, with agitation: Secondary | ICD-10-CM | POA: Diagnosis not present

## 2024-07-16 DIAGNOSIS — G301 Alzheimer's disease with late onset: Secondary | ICD-10-CM | POA: Diagnosis not present

## 2024-07-16 DIAGNOSIS — K219 Gastro-esophageal reflux disease without esophagitis: Secondary | ICD-10-CM | POA: Diagnosis not present

## 2024-07-16 DIAGNOSIS — K5901 Slow transit constipation: Secondary | ICD-10-CM | POA: Diagnosis not present

## 2024-07-20 DIAGNOSIS — N179 Acute kidney failure, unspecified: Secondary | ICD-10-CM | POA: Diagnosis not present

## 2024-07-20 DIAGNOSIS — K5901 Slow transit constipation: Secondary | ICD-10-CM | POA: Diagnosis not present

## 2024-07-20 DIAGNOSIS — K219 Gastro-esophageal reflux disease without esophagitis: Secondary | ICD-10-CM | POA: Diagnosis not present

## 2024-07-20 DIAGNOSIS — F03911 Unspecified dementia, unspecified severity, with agitation: Secondary | ICD-10-CM | POA: Diagnosis not present

## 2024-07-20 DIAGNOSIS — G301 Alzheimer's disease with late onset: Secondary | ICD-10-CM | POA: Diagnosis not present

## 2024-07-20 DIAGNOSIS — D320 Benign neoplasm of cerebral meninges: Secondary | ICD-10-CM | POA: Diagnosis not present

## 2024-07-23 DIAGNOSIS — D320 Benign neoplasm of cerebral meninges: Secondary | ICD-10-CM | POA: Diagnosis not present

## 2024-07-23 DIAGNOSIS — K5901 Slow transit constipation: Secondary | ICD-10-CM | POA: Diagnosis not present

## 2024-07-23 DIAGNOSIS — F03911 Unspecified dementia, unspecified severity, with agitation: Secondary | ICD-10-CM | POA: Diagnosis not present

## 2024-07-23 DIAGNOSIS — N179 Acute kidney failure, unspecified: Secondary | ICD-10-CM | POA: Diagnosis not present

## 2024-07-23 DIAGNOSIS — G301 Alzheimer's disease with late onset: Secondary | ICD-10-CM | POA: Diagnosis not present

## 2024-07-23 DIAGNOSIS — K219 Gastro-esophageal reflux disease without esophagitis: Secondary | ICD-10-CM | POA: Diagnosis not present

## 2024-07-25 DIAGNOSIS — K219 Gastro-esophageal reflux disease without esophagitis: Secondary | ICD-10-CM | POA: Diagnosis not present

## 2024-07-25 DIAGNOSIS — K5901 Slow transit constipation: Secondary | ICD-10-CM | POA: Diagnosis not present

## 2024-07-25 DIAGNOSIS — N179 Acute kidney failure, unspecified: Secondary | ICD-10-CM | POA: Diagnosis not present

## 2024-07-25 DIAGNOSIS — G301 Alzheimer's disease with late onset: Secondary | ICD-10-CM | POA: Diagnosis not present

## 2024-07-25 DIAGNOSIS — D320 Benign neoplasm of cerebral meninges: Secondary | ICD-10-CM | POA: Diagnosis not present

## 2024-07-25 DIAGNOSIS — F03911 Unspecified dementia, unspecified severity, with agitation: Secondary | ICD-10-CM | POA: Diagnosis not present

## 2024-07-26 DIAGNOSIS — N179 Acute kidney failure, unspecified: Secondary | ICD-10-CM | POA: Diagnosis not present

## 2024-07-26 DIAGNOSIS — G301 Alzheimer's disease with late onset: Secondary | ICD-10-CM | POA: Diagnosis not present

## 2024-07-26 DIAGNOSIS — K5901 Slow transit constipation: Secondary | ICD-10-CM | POA: Diagnosis not present

## 2024-07-26 DIAGNOSIS — K219 Gastro-esophageal reflux disease without esophagitis: Secondary | ICD-10-CM | POA: Diagnosis not present

## 2024-07-26 DIAGNOSIS — D320 Benign neoplasm of cerebral meninges: Secondary | ICD-10-CM | POA: Diagnosis not present

## 2024-07-26 DIAGNOSIS — F03911 Unspecified dementia, unspecified severity, with agitation: Secondary | ICD-10-CM | POA: Diagnosis not present

## 2024-07-27 DIAGNOSIS — D320 Benign neoplasm of cerebral meninges: Secondary | ICD-10-CM | POA: Diagnosis not present

## 2024-07-27 DIAGNOSIS — G301 Alzheimer's disease with late onset: Secondary | ICD-10-CM | POA: Diagnosis not present

## 2024-07-27 DIAGNOSIS — K219 Gastro-esophageal reflux disease without esophagitis: Secondary | ICD-10-CM | POA: Diagnosis not present

## 2024-07-27 DIAGNOSIS — N179 Acute kidney failure, unspecified: Secondary | ICD-10-CM | POA: Diagnosis not present

## 2024-07-27 DIAGNOSIS — K5901 Slow transit constipation: Secondary | ICD-10-CM | POA: Diagnosis not present

## 2024-07-27 DIAGNOSIS — F03911 Unspecified dementia, unspecified severity, with agitation: Secondary | ICD-10-CM | POA: Diagnosis not present

## 2024-07-30 DIAGNOSIS — F03911 Unspecified dementia, unspecified severity, with agitation: Secondary | ICD-10-CM | POA: Diagnosis not present

## 2024-07-30 DIAGNOSIS — K5901 Slow transit constipation: Secondary | ICD-10-CM | POA: Diagnosis not present

## 2024-07-30 DIAGNOSIS — G301 Alzheimer's disease with late onset: Secondary | ICD-10-CM | POA: Diagnosis not present

## 2024-07-30 DIAGNOSIS — N179 Acute kidney failure, unspecified: Secondary | ICD-10-CM | POA: Diagnosis not present

## 2024-07-30 DIAGNOSIS — D320 Benign neoplasm of cerebral meninges: Secondary | ICD-10-CM | POA: Diagnosis not present

## 2024-07-30 DIAGNOSIS — K219 Gastro-esophageal reflux disease without esophagitis: Secondary | ICD-10-CM | POA: Diagnosis not present

## 2024-08-01 DIAGNOSIS — F05 Delirium due to known physiological condition: Secondary | ICD-10-CM | POA: Diagnosis not present

## 2024-08-01 DIAGNOSIS — F03B2 Unspecified dementia, moderate, with psychotic disturbance: Secondary | ICD-10-CM | POA: Diagnosis not present

## 2024-08-01 DIAGNOSIS — F03B4 Unspecified dementia, moderate, with anxiety: Secondary | ICD-10-CM | POA: Diagnosis not present

## 2024-08-01 DIAGNOSIS — F03911 Unspecified dementia, unspecified severity, with agitation: Secondary | ICD-10-CM | POA: Diagnosis not present

## 2024-08-01 DIAGNOSIS — N179 Acute kidney failure, unspecified: Secondary | ICD-10-CM | POA: Diagnosis not present

## 2024-08-01 DIAGNOSIS — K219 Gastro-esophageal reflux disease without esophagitis: Secondary | ICD-10-CM | POA: Diagnosis not present

## 2024-08-01 DIAGNOSIS — F03B18 Unspecified dementia, moderate, with other behavioral disturbance: Secondary | ICD-10-CM | POA: Diagnosis not present

## 2024-08-01 DIAGNOSIS — H409 Unspecified glaucoma: Secondary | ICD-10-CM | POA: Diagnosis not present

## 2024-08-01 DIAGNOSIS — K5901 Slow transit constipation: Secondary | ICD-10-CM | POA: Diagnosis not present

## 2024-08-01 DIAGNOSIS — F03B3 Unspecified dementia, moderate, with mood disturbance: Secondary | ICD-10-CM | POA: Diagnosis not present

## 2024-08-01 DIAGNOSIS — F32A Depression, unspecified: Secondary | ICD-10-CM | POA: Diagnosis not present

## 2024-08-01 DIAGNOSIS — D320 Benign neoplasm of cerebral meninges: Secondary | ICD-10-CM | POA: Diagnosis not present

## 2024-08-01 DIAGNOSIS — G301 Alzheimer's disease with late onset: Secondary | ICD-10-CM | POA: Diagnosis not present

## 2024-08-03 DIAGNOSIS — N179 Acute kidney failure, unspecified: Secondary | ICD-10-CM | POA: Diagnosis not present

## 2024-08-03 DIAGNOSIS — K5901 Slow transit constipation: Secondary | ICD-10-CM | POA: Diagnosis not present

## 2024-08-03 DIAGNOSIS — F03911 Unspecified dementia, unspecified severity, with agitation: Secondary | ICD-10-CM | POA: Diagnosis not present

## 2024-08-03 DIAGNOSIS — K219 Gastro-esophageal reflux disease without esophagitis: Secondary | ICD-10-CM | POA: Diagnosis not present

## 2024-08-03 DIAGNOSIS — D320 Benign neoplasm of cerebral meninges: Secondary | ICD-10-CM | POA: Diagnosis not present

## 2024-08-03 DIAGNOSIS — G301 Alzheimer's disease with late onset: Secondary | ICD-10-CM | POA: Diagnosis not present

## 2024-08-06 DIAGNOSIS — K219 Gastro-esophageal reflux disease without esophagitis: Secondary | ICD-10-CM | POA: Diagnosis not present

## 2024-08-06 DIAGNOSIS — G301 Alzheimer's disease with late onset: Secondary | ICD-10-CM | POA: Diagnosis not present

## 2024-08-06 DIAGNOSIS — F03911 Unspecified dementia, unspecified severity, with agitation: Secondary | ICD-10-CM | POA: Diagnosis not present

## 2024-08-06 DIAGNOSIS — N179 Acute kidney failure, unspecified: Secondary | ICD-10-CM | POA: Diagnosis not present

## 2024-08-06 DIAGNOSIS — D320 Benign neoplasm of cerebral meninges: Secondary | ICD-10-CM | POA: Diagnosis not present

## 2024-08-06 DIAGNOSIS — K5901 Slow transit constipation: Secondary | ICD-10-CM | POA: Diagnosis not present

## 2024-08-08 DIAGNOSIS — D320 Benign neoplasm of cerebral meninges: Secondary | ICD-10-CM | POA: Diagnosis not present

## 2024-08-08 DIAGNOSIS — F03911 Unspecified dementia, unspecified severity, with agitation: Secondary | ICD-10-CM | POA: Diagnosis not present

## 2024-08-08 DIAGNOSIS — K219 Gastro-esophageal reflux disease without esophagitis: Secondary | ICD-10-CM | POA: Diagnosis not present

## 2024-08-08 DIAGNOSIS — G301 Alzheimer's disease with late onset: Secondary | ICD-10-CM | POA: Diagnosis not present

## 2024-08-08 DIAGNOSIS — K5901 Slow transit constipation: Secondary | ICD-10-CM | POA: Diagnosis not present

## 2024-08-08 DIAGNOSIS — N179 Acute kidney failure, unspecified: Secondary | ICD-10-CM | POA: Diagnosis not present

## 2024-08-10 DIAGNOSIS — K5901 Slow transit constipation: Secondary | ICD-10-CM | POA: Diagnosis not present

## 2024-08-10 DIAGNOSIS — G301 Alzheimer's disease with late onset: Secondary | ICD-10-CM | POA: Diagnosis not present

## 2024-08-10 DIAGNOSIS — F03911 Unspecified dementia, unspecified severity, with agitation: Secondary | ICD-10-CM | POA: Diagnosis not present

## 2024-08-10 DIAGNOSIS — K219 Gastro-esophageal reflux disease without esophagitis: Secondary | ICD-10-CM | POA: Diagnosis not present

## 2024-08-10 DIAGNOSIS — D320 Benign neoplasm of cerebral meninges: Secondary | ICD-10-CM | POA: Diagnosis not present

## 2024-08-10 DIAGNOSIS — N179 Acute kidney failure, unspecified: Secondary | ICD-10-CM | POA: Diagnosis not present

## 2024-08-11 DIAGNOSIS — I10 Essential (primary) hypertension: Secondary | ICD-10-CM | POA: Diagnosis not present

## 2024-08-11 DIAGNOSIS — R443 Hallucinations, unspecified: Secondary | ICD-10-CM | POA: Diagnosis not present

## 2024-08-11 DIAGNOSIS — F514 Sleep terrors [night terrors]: Secondary | ICD-10-CM | POA: Diagnosis not present

## 2024-08-11 DIAGNOSIS — A419 Sepsis, unspecified organism: Secondary | ICD-10-CM | POA: Diagnosis not present

## 2024-08-11 DIAGNOSIS — Z853 Personal history of malignant neoplasm of breast: Secondary | ICD-10-CM | POA: Diagnosis not present

## 2024-08-11 DIAGNOSIS — N3281 Overactive bladder: Secondary | ICD-10-CM | POA: Diagnosis not present

## 2024-08-11 DIAGNOSIS — Z9181 History of falling: Secondary | ICD-10-CM | POA: Diagnosis not present

## 2024-08-11 DIAGNOSIS — K219 Gastro-esophageal reflux disease without esophagitis: Secondary | ICD-10-CM | POA: Diagnosis not present

## 2024-08-11 DIAGNOSIS — D320 Benign neoplasm of cerebral meninges: Secondary | ICD-10-CM | POA: Diagnosis not present

## 2024-08-11 DIAGNOSIS — G4733 Obstructive sleep apnea (adult) (pediatric): Secondary | ICD-10-CM | POA: Diagnosis not present

## 2024-08-11 DIAGNOSIS — G301 Alzheimer's disease with late onset: Secondary | ICD-10-CM | POA: Diagnosis not present

## 2024-08-11 DIAGNOSIS — K5901 Slow transit constipation: Secondary | ICD-10-CM | POA: Diagnosis not present

## 2024-08-11 DIAGNOSIS — K5732 Diverticulitis of large intestine without perforation or abscess without bleeding: Secondary | ICD-10-CM | POA: Diagnosis not present

## 2024-08-11 DIAGNOSIS — N179 Acute kidney failure, unspecified: Secondary | ICD-10-CM | POA: Diagnosis not present

## 2024-08-11 DIAGNOSIS — H409 Unspecified glaucoma: Secondary | ICD-10-CM | POA: Diagnosis not present

## 2024-08-11 DIAGNOSIS — E785 Hyperlipidemia, unspecified: Secondary | ICD-10-CM | POA: Diagnosis not present

## 2024-08-11 DIAGNOSIS — Z66 Do not resuscitate: Secondary | ICD-10-CM | POA: Diagnosis not present

## 2024-08-11 DIAGNOSIS — F03911 Unspecified dementia, unspecified severity, with agitation: Secondary | ICD-10-CM | POA: Diagnosis not present

## 2024-08-13 ENCOUNTER — Other Ambulatory Visit: Payer: Self-pay | Admitting: Primary Care

## 2024-08-13 DIAGNOSIS — N179 Acute kidney failure, unspecified: Secondary | ICD-10-CM | POA: Diagnosis not present

## 2024-08-13 DIAGNOSIS — F03911 Unspecified dementia, unspecified severity, with agitation: Secondary | ICD-10-CM | POA: Diagnosis not present

## 2024-08-13 DIAGNOSIS — K219 Gastro-esophageal reflux disease without esophagitis: Secondary | ICD-10-CM

## 2024-08-13 DIAGNOSIS — K5901 Slow transit constipation: Secondary | ICD-10-CM | POA: Diagnosis not present

## 2024-08-13 DIAGNOSIS — G301 Alzheimer's disease with late onset: Secondary | ICD-10-CM | POA: Diagnosis not present

## 2024-08-13 DIAGNOSIS — D320 Benign neoplasm of cerebral meninges: Secondary | ICD-10-CM | POA: Diagnosis not present

## 2024-08-15 DIAGNOSIS — K5901 Slow transit constipation: Secondary | ICD-10-CM | POA: Diagnosis not present

## 2024-08-15 DIAGNOSIS — K219 Gastro-esophageal reflux disease without esophagitis: Secondary | ICD-10-CM | POA: Diagnosis not present

## 2024-08-15 DIAGNOSIS — G301 Alzheimer's disease with late onset: Secondary | ICD-10-CM | POA: Diagnosis not present

## 2024-08-15 DIAGNOSIS — D320 Benign neoplasm of cerebral meninges: Secondary | ICD-10-CM | POA: Diagnosis not present

## 2024-08-15 DIAGNOSIS — N179 Acute kidney failure, unspecified: Secondary | ICD-10-CM | POA: Diagnosis not present

## 2024-08-15 DIAGNOSIS — F03911 Unspecified dementia, unspecified severity, with agitation: Secondary | ICD-10-CM | POA: Diagnosis not present

## 2024-08-17 DIAGNOSIS — K219 Gastro-esophageal reflux disease without esophagitis: Secondary | ICD-10-CM | POA: Diagnosis not present

## 2024-08-17 DIAGNOSIS — D320 Benign neoplasm of cerebral meninges: Secondary | ICD-10-CM | POA: Diagnosis not present

## 2024-08-17 DIAGNOSIS — F03911 Unspecified dementia, unspecified severity, with agitation: Secondary | ICD-10-CM | POA: Diagnosis not present

## 2024-08-17 DIAGNOSIS — K5901 Slow transit constipation: Secondary | ICD-10-CM | POA: Diagnosis not present

## 2024-08-17 DIAGNOSIS — N179 Acute kidney failure, unspecified: Secondary | ICD-10-CM | POA: Diagnosis not present

## 2024-08-17 DIAGNOSIS — G301 Alzheimer's disease with late onset: Secondary | ICD-10-CM | POA: Diagnosis not present

## 2024-08-20 DIAGNOSIS — F03911 Unspecified dementia, unspecified severity, with agitation: Secondary | ICD-10-CM | POA: Diagnosis not present

## 2024-08-20 DIAGNOSIS — K5901 Slow transit constipation: Secondary | ICD-10-CM | POA: Diagnosis not present

## 2024-08-20 DIAGNOSIS — N179 Acute kidney failure, unspecified: Secondary | ICD-10-CM | POA: Diagnosis not present

## 2024-08-20 DIAGNOSIS — G301 Alzheimer's disease with late onset: Secondary | ICD-10-CM | POA: Diagnosis not present

## 2024-08-20 DIAGNOSIS — D320 Benign neoplasm of cerebral meninges: Secondary | ICD-10-CM | POA: Diagnosis not present

## 2024-08-20 DIAGNOSIS — K219 Gastro-esophageal reflux disease without esophagitis: Secondary | ICD-10-CM | POA: Diagnosis not present

## 2024-08-22 DIAGNOSIS — D320 Benign neoplasm of cerebral meninges: Secondary | ICD-10-CM | POA: Diagnosis not present

## 2024-08-22 DIAGNOSIS — G301 Alzheimer's disease with late onset: Secondary | ICD-10-CM | POA: Diagnosis not present

## 2024-08-22 DIAGNOSIS — F03911 Unspecified dementia, unspecified severity, with agitation: Secondary | ICD-10-CM | POA: Diagnosis not present

## 2024-08-22 DIAGNOSIS — N179 Acute kidney failure, unspecified: Secondary | ICD-10-CM | POA: Diagnosis not present

## 2024-08-22 DIAGNOSIS — K5901 Slow transit constipation: Secondary | ICD-10-CM | POA: Diagnosis not present

## 2024-08-22 DIAGNOSIS — K219 Gastro-esophageal reflux disease without esophagitis: Secondary | ICD-10-CM | POA: Diagnosis not present

## 2024-08-23 DIAGNOSIS — F03B3 Unspecified dementia, moderate, with mood disturbance: Secondary | ICD-10-CM | POA: Diagnosis not present

## 2024-08-23 DIAGNOSIS — F03B2 Unspecified dementia, moderate, with psychotic disturbance: Secondary | ICD-10-CM | POA: Diagnosis not present

## 2024-08-23 DIAGNOSIS — F03B18 Unspecified dementia, moderate, with other behavioral disturbance: Secondary | ICD-10-CM | POA: Diagnosis not present

## 2024-08-23 DIAGNOSIS — H409 Unspecified glaucoma: Secondary | ICD-10-CM | POA: Diagnosis not present

## 2024-08-23 DIAGNOSIS — F32A Depression, unspecified: Secondary | ICD-10-CM | POA: Diagnosis not present

## 2024-08-23 DIAGNOSIS — F03B4 Unspecified dementia, moderate, with anxiety: Secondary | ICD-10-CM | POA: Diagnosis not present

## 2024-08-23 DIAGNOSIS — F05 Delirium due to known physiological condition: Secondary | ICD-10-CM | POA: Diagnosis not present

## 2024-08-24 DIAGNOSIS — G301 Alzheimer's disease with late onset: Secondary | ICD-10-CM | POA: Diagnosis not present

## 2024-08-24 DIAGNOSIS — F03911 Unspecified dementia, unspecified severity, with agitation: Secondary | ICD-10-CM | POA: Diagnosis not present

## 2024-08-24 DIAGNOSIS — N179 Acute kidney failure, unspecified: Secondary | ICD-10-CM | POA: Diagnosis not present

## 2024-08-24 DIAGNOSIS — K219 Gastro-esophageal reflux disease without esophagitis: Secondary | ICD-10-CM | POA: Diagnosis not present

## 2024-08-24 DIAGNOSIS — D320 Benign neoplasm of cerebral meninges: Secondary | ICD-10-CM | POA: Diagnosis not present

## 2024-08-24 DIAGNOSIS — K5901 Slow transit constipation: Secondary | ICD-10-CM | POA: Diagnosis not present

## 2024-08-27 DIAGNOSIS — D320 Benign neoplasm of cerebral meninges: Secondary | ICD-10-CM | POA: Diagnosis not present

## 2024-08-27 DIAGNOSIS — K5901 Slow transit constipation: Secondary | ICD-10-CM | POA: Diagnosis not present

## 2024-08-27 DIAGNOSIS — F03911 Unspecified dementia, unspecified severity, with agitation: Secondary | ICD-10-CM | POA: Diagnosis not present

## 2024-08-27 DIAGNOSIS — N179 Acute kidney failure, unspecified: Secondary | ICD-10-CM | POA: Diagnosis not present

## 2024-08-27 DIAGNOSIS — K219 Gastro-esophageal reflux disease without esophagitis: Secondary | ICD-10-CM | POA: Diagnosis not present

## 2024-08-27 DIAGNOSIS — G301 Alzheimer's disease with late onset: Secondary | ICD-10-CM | POA: Diagnosis not present

## 2024-08-29 DIAGNOSIS — D320 Benign neoplasm of cerebral meninges: Secondary | ICD-10-CM | POA: Diagnosis not present

## 2024-08-29 DIAGNOSIS — K219 Gastro-esophageal reflux disease without esophagitis: Secondary | ICD-10-CM | POA: Diagnosis not present

## 2024-08-29 DIAGNOSIS — N179 Acute kidney failure, unspecified: Secondary | ICD-10-CM | POA: Diagnosis not present

## 2024-08-29 DIAGNOSIS — K5901 Slow transit constipation: Secondary | ICD-10-CM | POA: Diagnosis not present

## 2024-08-29 DIAGNOSIS — G301 Alzheimer's disease with late onset: Secondary | ICD-10-CM | POA: Diagnosis not present

## 2024-08-29 DIAGNOSIS — F03911 Unspecified dementia, unspecified severity, with agitation: Secondary | ICD-10-CM | POA: Diagnosis not present

## 2024-08-30 DIAGNOSIS — R296 Repeated falls: Secondary | ICD-10-CM | POA: Diagnosis not present

## 2024-08-30 DIAGNOSIS — F514 Sleep terrors [night terrors]: Secondary | ICD-10-CM | POA: Diagnosis not present

## 2024-08-30 DIAGNOSIS — R441 Visual hallucinations: Secondary | ICD-10-CM | POA: Diagnosis not present

## 2024-08-30 DIAGNOSIS — F02C3 Dementia in other diseases classified elsewhere, severe, with mood disturbance: Secondary | ICD-10-CM | POA: Diagnosis not present

## 2024-08-30 DIAGNOSIS — Z8659 Personal history of other mental and behavioral disorders: Secondary | ICD-10-CM | POA: Diagnosis not present

## 2024-08-30 DIAGNOSIS — F411 Generalized anxiety disorder: Secondary | ICD-10-CM | POA: Diagnosis not present

## 2024-08-30 DIAGNOSIS — G309 Alzheimer's disease, unspecified: Secondary | ICD-10-CM | POA: Diagnosis not present

## 2024-08-31 ENCOUNTER — Other Ambulatory Visit: Payer: Self-pay | Admitting: Primary Care

## 2024-08-31 DIAGNOSIS — K5901 Slow transit constipation: Secondary | ICD-10-CM | POA: Diagnosis not present

## 2024-08-31 DIAGNOSIS — N179 Acute kidney failure, unspecified: Secondary | ICD-10-CM | POA: Diagnosis not present

## 2024-08-31 DIAGNOSIS — D320 Benign neoplasm of cerebral meninges: Secondary | ICD-10-CM | POA: Diagnosis not present

## 2024-08-31 DIAGNOSIS — F03911 Unspecified dementia, unspecified severity, with agitation: Secondary | ICD-10-CM | POA: Diagnosis not present

## 2024-08-31 DIAGNOSIS — K219 Gastro-esophageal reflux disease without esophagitis: Secondary | ICD-10-CM | POA: Diagnosis not present

## 2024-08-31 DIAGNOSIS — F03918 Unspecified dementia, unspecified severity, with other behavioral disturbance: Secondary | ICD-10-CM

## 2024-08-31 DIAGNOSIS — F418 Other specified anxiety disorders: Secondary | ICD-10-CM

## 2024-08-31 DIAGNOSIS — G301 Alzheimer's disease with late onset: Secondary | ICD-10-CM | POA: Diagnosis not present

## 2024-09-03 DIAGNOSIS — K5901 Slow transit constipation: Secondary | ICD-10-CM | POA: Diagnosis not present

## 2024-09-03 DIAGNOSIS — F03911 Unspecified dementia, unspecified severity, with agitation: Secondary | ICD-10-CM | POA: Diagnosis not present

## 2024-09-03 DIAGNOSIS — N179 Acute kidney failure, unspecified: Secondary | ICD-10-CM | POA: Diagnosis not present

## 2024-09-03 DIAGNOSIS — G301 Alzheimer's disease with late onset: Secondary | ICD-10-CM | POA: Diagnosis not present

## 2024-09-03 DIAGNOSIS — K219 Gastro-esophageal reflux disease without esophagitis: Secondary | ICD-10-CM | POA: Diagnosis not present

## 2024-09-03 DIAGNOSIS — D320 Benign neoplasm of cerebral meninges: Secondary | ICD-10-CM | POA: Diagnosis not present

## 2024-09-05 DIAGNOSIS — K219 Gastro-esophageal reflux disease without esophagitis: Secondary | ICD-10-CM | POA: Diagnosis not present

## 2024-09-05 DIAGNOSIS — K5901 Slow transit constipation: Secondary | ICD-10-CM | POA: Diagnosis not present

## 2024-09-05 DIAGNOSIS — D320 Benign neoplasm of cerebral meninges: Secondary | ICD-10-CM | POA: Diagnosis not present

## 2024-09-05 DIAGNOSIS — R4182 Altered mental status, unspecified: Secondary | ICD-10-CM | POA: Diagnosis not present

## 2024-09-05 DIAGNOSIS — Z743 Need for continuous supervision: Secondary | ICD-10-CM | POA: Diagnosis not present

## 2024-09-05 DIAGNOSIS — R5383 Other fatigue: Secondary | ICD-10-CM | POA: Diagnosis not present

## 2024-09-05 DIAGNOSIS — F03911 Unspecified dementia, unspecified severity, with agitation: Secondary | ICD-10-CM | POA: Diagnosis not present

## 2024-09-05 DIAGNOSIS — G301 Alzheimer's disease with late onset: Secondary | ICD-10-CM | POA: Diagnosis not present

## 2024-09-05 DIAGNOSIS — N179 Acute kidney failure, unspecified: Secondary | ICD-10-CM | POA: Diagnosis not present

## 2024-09-07 DIAGNOSIS — F03911 Unspecified dementia, unspecified severity, with agitation: Secondary | ICD-10-CM | POA: Diagnosis not present

## 2024-09-07 DIAGNOSIS — D320 Benign neoplasm of cerebral meninges: Secondary | ICD-10-CM | POA: Diagnosis not present

## 2024-09-07 DIAGNOSIS — N179 Acute kidney failure, unspecified: Secondary | ICD-10-CM | POA: Diagnosis not present

## 2024-09-07 DIAGNOSIS — G301 Alzheimer's disease with late onset: Secondary | ICD-10-CM | POA: Diagnosis not present

## 2024-09-07 DIAGNOSIS — K219 Gastro-esophageal reflux disease without esophagitis: Secondary | ICD-10-CM | POA: Diagnosis not present

## 2024-09-07 DIAGNOSIS — K5901 Slow transit constipation: Secondary | ICD-10-CM | POA: Diagnosis not present

## 2024-09-10 DIAGNOSIS — Z7401 Bed confinement status: Secondary | ICD-10-CM | POA: Diagnosis not present

## 2024-09-10 DIAGNOSIS — R4182 Altered mental status, unspecified: Secondary | ICD-10-CM | POA: Diagnosis not present

## 2024-09-11 ENCOUNTER — Telehealth: Payer: Self-pay

## 2024-09-11 NOTE — Transitions of Care (Post Inpatient/ED Visit) (Signed)
 09/11/2024  Name: Caroline French MRN: [id number] DOB: [birth year]  Today's TOC FU Call Status: Today's TOC FU Call Status:: Successful TOC FU Call Completed TOC FU Call Complete Date: 09/11/24  Patient's Name and Date of Birth confirmed. Name, DOB  Transition Care Management Follow-up Telephone Call Date of Discharge: 09/10/24 Discharge Facility: Other ([facility]) Name of Other ([facility]) [facility]charge Facility: [facility] Type of Discharge: Inpatient Admission Primary Inpatient Discharge Diagnosis:: dementia How have you been since you were released from the hospital?: Same Any questions or concerns?: Yes Patient Questions/Concerns:: was sent home with O2 , no education or explaination was provided Patient Questions/Concerns Addressed: Notified Provider of Patient Questions/Concerns  Items Reviewed: Did you receive and understand the discharge instructions provided?: No Medications obtained,verified, and reconciled?: Yes (Medications Reviewed) Any new allergies since your discharge?: No Dietary orders reviewed?: Yes Do you have support at home?: Yes People in Home [RPT]: child(ren), adult Name of Support/Comfort Primary Source: caregiver  Medications Reviewed Today: Medications Reviewed Today     Reviewed by Emmitt Pan, LPN (Licensed Practical Nurse) on 09/11/24 at 0912  Med List Status: <None>   Medication Order Taking? Sig Documenting Provider Last Dose Status Informant  aspirin  (ASPIRIN  LOW DOSE) 81 MG EC tablet [id number] Yes TAKE 1 TABLET BY MOUTH ONCE DAILY Clark, Katherine K, NP  Active Child  atorvastatin  (LIPITOR) 40 MG tablet [id number] Yes TAKE 1 TABLET BY MOUTH EVERY DAY FOR CHOLESTEROL Clark, Katherine K, NP  Active   Calcium  Citrate-Vitamin D3 315-6.25 MG-MCG TABS [id number] Yes Take 1 tablet by mouth daily. [provider]  Active Child  donepezil  (ARICEPT ) 10 MG tablet [id number] Yes Take 1 tablet (10 mg total) by mouth at bedtime. For memory.  Gretta Comer POUR, NP  Active Child  hydrOXYzine  (ATARAX ) 10 MG tablet [id number] Yes TAKE 2 TABLETS (20 MG TOTAL) BY MOUTH AT BEDTIME AS NEEDED FOR ANXIETY. Clark, Katherine K, NP  Active   latanoprost  (XALATAN ) 0.005 % ophthalmic solution [id number] Yes Place 1 drop into both eyes at bedtime. [provider]  Active Child  loratadine  (CLARITIN ) 10 MG tablet [id number] Yes Take 10 mg by mouth daily. [provider]  Active Child  memantine  (NAMENDA ) 10 MG tablet [id number] Yes Take 10 mg by mouth 2 (two) times daily. [provider]  Active   Multiple Vitamin (MULTI-VITAMINS) TABS [id number] Yes Take 1 tablet by mouth daily. [provider]  Active Child  omeprazole  (PRILOSEC) 40 MG capsule [id number] Yes TAKE 1 CAPSULE (40 MG TOTAL) BY MOUTH DAILY. FOR HEARTBURN. Clark, Katherine K, NP  Active   polyethylene glycol powder (GLYCOLAX /MIRALAX ) 17 GM/SCOOP powder [id number] Yes Take 17 g by mouth daily. Pt is  taking at every day [provider]  Active Child  risperiDONE  (RISPERDAL ) 1 MG tablet [id number] Yes Take 1 mg by mouth at bedtime. [provider]  Active Child  sertraline  (ZOLOFT ) 100 MG tablet [id number] Yes Take 200 mg by mouth daily with supper. [provider]  Active Child            Home Care and Equipment/Supplies: Were Home Health Services Ordered?: NA Any new equipment or medical supplies ordered?: Yes Name of Medical supply agency?: unknown Were you able to get the equipment/medical supplies?: Yes Do you have any questions related to the use of the equipment/supplies?: Yes What questions do you have?: doesn't know how or when to use the O2  Functional Questionnaire: Do you need assistance with bathing/showering or dressing?: Yes Do you  need assistance with meal preparation?: Yes Do you need assistance with eating?: Yes Do you have difficulty maintaining continence: Yes Do you need assistance with getting out of  bed/getting out of a chair/moving?: Yes Do you have difficulty managing or taking your medications?: Yes  Follow up appointments reviewed: PCP Follow-up appointment confirmed?: No (Hospice) MD Provider Line Number:[phone] Given: No Specialist Hospital Follow-up appointment confirmed?: NA Do you need transportation to your follow-up appointment?: No Do you understand care options if your condition(s) worsen?: Yes-patient verbalized understanding  Hospice  SIGNATURE Julian Lemmings, LPN [facility] Nurse Health Advisor Direct Dial [phone]

## 2025-04-05 ENCOUNTER — Ambulatory Visit
